# Patient Record
Sex: Male | Born: 1950 | Race: White | Hispanic: No | Marital: Single | Smoking: Never smoker
Health system: Southern US, Community
[De-identification: ages and names within clinical notes are randomized; demographics above are authoritative.]

## PROBLEM LIST (undated history)

## (undated) DIAGNOSIS — D72829 Elevated white blood cell count, unspecified: Secondary | ICD-10-CM

## (undated) DIAGNOSIS — Z87820 Personal history of traumatic brain injury: Secondary | ICD-10-CM

## (undated) DIAGNOSIS — R1310 Dysphagia, unspecified: Secondary | ICD-10-CM

## (undated) DIAGNOSIS — S069X9A Unspecified intracranial injury with loss of consciousness of unspecified duration, initial encounter: Secondary | ICD-10-CM

## (undated) DIAGNOSIS — I10 Essential (primary) hypertension: Secondary | ICD-10-CM

## (undated) DIAGNOSIS — I483 Typical atrial flutter: Secondary | ICD-10-CM

## (undated) DIAGNOSIS — I509 Heart failure, unspecified: Secondary | ICD-10-CM

## (undated) DIAGNOSIS — G8194 Hemiplegia, unspecified affecting left nondominant side: Secondary | ICD-10-CM

## (undated) DIAGNOSIS — G8190 Hemiplegia, unspecified affecting unspecified side: Secondary | ICD-10-CM

## (undated) DIAGNOSIS — T68XXXA Hypothermia, initial encounter: Secondary | ICD-10-CM

## (undated) DIAGNOSIS — Z9189 Other specified personal risk factors, not elsewhere classified: Secondary | ICD-10-CM

## (undated) DIAGNOSIS — E785 Hyperlipidemia, unspecified: Secondary | ICD-10-CM

## (undated) DIAGNOSIS — G40909 Epilepsy, unspecified, not intractable, without status epilepticus: Secondary | ICD-10-CM

## (undated) DIAGNOSIS — Z66 Do not resuscitate: Secondary | ICD-10-CM

## (undated) DIAGNOSIS — K5909 Other constipation: Secondary | ICD-10-CM

## (undated) DIAGNOSIS — R1084 Generalized abdominal pain: Secondary | ICD-10-CM

## (undated) DIAGNOSIS — R197 Diarrhea, unspecified: Secondary | ICD-10-CM

## (undated) DIAGNOSIS — S069XAA Unspecified intracranial injury with loss of consciousness status unknown, initial encounter: Secondary | ICD-10-CM

## (undated) DIAGNOSIS — I959 Hypotension, unspecified: Secondary | ICD-10-CM

## (undated) DIAGNOSIS — I639 Cerebral infarction, unspecified: Secondary | ICD-10-CM

## (undated) DIAGNOSIS — I2510 Atherosclerotic heart disease of native coronary artery without angina pectoris: Secondary | ICD-10-CM

## (undated) DIAGNOSIS — G40219 Localization-related (focal) (partial) symptomatic epilepsy and epileptic syndromes with complex partial seizures, intractable, without status epilepticus: Secondary | ICD-10-CM

## (undated) DIAGNOSIS — A419 Sepsis, unspecified organism: Secondary | ICD-10-CM

## (undated) DIAGNOSIS — R4182 Altered mental status, unspecified: Secondary | ICD-10-CM

## (undated) DIAGNOSIS — G40119 Localization-related (focal) (partial) symptomatic epilepsy and epileptic syndromes with simple partial seizures, intractable, without status epilepticus: Secondary | ICD-10-CM

## (undated) HISTORY — PX: CORONARY ANGIOPLASTY WITH STENT PLACEMENT: SHX49

## (undated) HISTORY — PX: HERNIA REPAIR: SHX51

## (undated) HISTORY — DX: Generalized abdominal pain: R10.84

## (undated) HISTORY — DX: Other constipation: K59.09

## (undated) HISTORY — DX: Epilepsy, unspecified, not intractable, without status epilepticus: G40.909

## (undated) HISTORY — PX: CRANIOTOMY: SHX93

## (undated) HISTORY — DX: Hemiplegia, unspecified affecting unspecified side: G81.90

## (undated) HISTORY — DX: Essential (primary) hypertension: I10

## (undated) HISTORY — DX: Altered mental status, unspecified: R41.82

## (undated) HISTORY — DX: Diarrhea, unspecified: R19.7

## (undated) HISTORY — DX: Localization-related (focal) (partial) symptomatic epilepsy and epileptic syndromes with complex partial seizures, intractable, without status epilepticus: G40.219

## (undated) HISTORY — DX: Personal history of traumatic brain injury: Z87.820

## (undated) HISTORY — DX: Hemiplegia, unspecified affecting left nondominant side: G81.94

## (undated) HISTORY — DX: Hypothermia, initial encounter: T68.XXXA

## (undated) HISTORY — PX: BRAIN SURGERY: SHX531

## (undated) HISTORY — DX: Other specified personal risk factors, not elsewhere classified: Z91.89

## (undated) HISTORY — PX: CHOLECYSTECTOMY: SHX55

## (undated) HISTORY — DX: Hypotension, unspecified: I95.9

## (undated) HISTORY — DX: Hyperlipidemia, unspecified: E78.5

## (undated) HISTORY — DX: Typical atrial flutter: I48.3

## (undated) HISTORY — DX: Do not resuscitate: Z66

## (undated) HISTORY — DX: Sepsis, unspecified organism: A41.9

## (undated) HISTORY — DX: Elevated white blood cell count, unspecified: D72.829

## (undated) HISTORY — DX: Localization-related (focal) (partial) symptomatic epilepsy and epileptic syndromes with simple partial seizures, intractable, without status epilepticus: G40.119

## (undated) HISTORY — PX: CARDIAC SURGERY: SHX584

---

## 2004-08-19 ENCOUNTER — Ambulatory Visit: Payer: Self-pay | Admitting: Cardiology

## 2004-08-19 ENCOUNTER — Inpatient Hospital Stay (HOSPITAL_COMMUNITY): Admission: AD | Admit: 2004-08-19 | Discharge: 2004-08-23 | Payer: Self-pay | Admitting: Cardiology

## 2011-12-13 DIAGNOSIS — G40219 Localization-related (focal) (partial) symptomatic epilepsy and epileptic syndromes with complex partial seizures, intractable, without status epilepticus: Secondary | ICD-10-CM

## 2011-12-13 DIAGNOSIS — G8190 Hemiplegia, unspecified affecting unspecified side: Secondary | ICD-10-CM

## 2011-12-13 HISTORY — DX: Hemiplegia, unspecified affecting unspecified side: G81.90

## 2011-12-13 HISTORY — DX: Localization-related (focal) (partial) symptomatic epilepsy and epileptic syndromes with complex partial seizures, intractable, without status epilepticus: G40.219

## 2015-06-30 DIAGNOSIS — E785 Hyperlipidemia, unspecified: Secondary | ICD-10-CM

## 2015-06-30 HISTORY — DX: Hyperlipidemia, unspecified: E78.5

## 2016-03-22 DIAGNOSIS — G40309 Generalized idiopathic epilepsy and epileptic syndromes, not intractable, without status epilepticus: Secondary | ICD-10-CM

## 2016-03-22 DIAGNOSIS — G9341 Metabolic encephalopathy: Secondary | ICD-10-CM | POA: Diagnosis not present

## 2016-03-22 DIAGNOSIS — I63319 Cerebral infarction due to thrombosis of unspecified middle cerebral artery: Secondary | ICD-10-CM

## 2016-03-22 DIAGNOSIS — N289 Disorder of kidney and ureter, unspecified: Secondary | ICD-10-CM | POA: Diagnosis not present

## 2016-03-22 DIAGNOSIS — R197 Diarrhea, unspecified: Secondary | ICD-10-CM

## 2016-03-22 DIAGNOSIS — M4854XA Collapsed vertebra, not elsewhere classified, thoracic region, initial encounter for fracture: Secondary | ICD-10-CM

## 2016-03-23 DIAGNOSIS — G40309 Generalized idiopathic epilepsy and epileptic syndromes, not intractable, without status epilepticus: Secondary | ICD-10-CM

## 2016-03-23 DIAGNOSIS — I69319 Unspecified symptoms and signs involving cognitive functions following cerebral infarction: Secondary | ICD-10-CM

## 2016-03-23 DIAGNOSIS — N289 Disorder of kidney and ureter, unspecified: Secondary | ICD-10-CM

## 2016-03-23 DIAGNOSIS — R197 Diarrhea, unspecified: Secondary | ICD-10-CM

## 2016-03-23 DIAGNOSIS — M4854XA Collapsed vertebra, not elsewhere classified, thoracic region, initial encounter for fracture: Secondary | ICD-10-CM

## 2016-03-23 DIAGNOSIS — G9341 Metabolic encephalopathy: Secondary | ICD-10-CM

## 2016-03-24 DIAGNOSIS — G9341 Metabolic encephalopathy: Secondary | ICD-10-CM

## 2016-03-24 DIAGNOSIS — N289 Disorder of kidney and ureter, unspecified: Secondary | ICD-10-CM | POA: Diagnosis not present

## 2016-03-24 DIAGNOSIS — G40309 Generalized idiopathic epilepsy and epileptic syndromes, not intractable, without status epilepticus: Secondary | ICD-10-CM

## 2016-03-24 DIAGNOSIS — M4854XA Collapsed vertebra, not elsewhere classified, thoracic region, initial encounter for fracture: Secondary | ICD-10-CM

## 2016-03-24 DIAGNOSIS — I63319 Cerebral infarction due to thrombosis of unspecified middle cerebral artery: Secondary | ICD-10-CM

## 2016-03-24 DIAGNOSIS — R197 Diarrhea, unspecified: Secondary | ICD-10-CM

## 2016-03-29 ENCOUNTER — Encounter (HOSPITAL_COMMUNITY): Payer: Self-pay | Admitting: Emergency Medicine

## 2016-03-29 ENCOUNTER — Inpatient Hospital Stay (HOSPITAL_COMMUNITY)
Admission: EM | Admit: 2016-03-29 | Discharge: 2016-04-03 | DRG: 872 | Disposition: A | Discharge status: Skilled Nursing Facility | Payer: Medicare Other | Attending: Family Medicine | Admitting: Family Medicine

## 2016-03-29 ENCOUNTER — Emergency Department (HOSPITAL_COMMUNITY): Payer: Medicare Other

## 2016-03-29 DIAGNOSIS — I4891 Unspecified atrial fibrillation: Secondary | ICD-10-CM | POA: Diagnosis not present

## 2016-03-29 DIAGNOSIS — Z87820 Personal history of traumatic brain injury: Secondary | ICD-10-CM | POA: Diagnosis present

## 2016-03-29 DIAGNOSIS — Z79899 Other long term (current) drug therapy: Secondary | ICD-10-CM | POA: Diagnosis not present

## 2016-03-29 DIAGNOSIS — K5900 Constipation, unspecified: Secondary | ICD-10-CM | POA: Diagnosis not present

## 2016-03-29 DIAGNOSIS — I2510 Atherosclerotic heart disease of native coronary artery without angina pectoris: Secondary | ICD-10-CM | POA: Diagnosis present

## 2016-03-29 DIAGNOSIS — Z66 Do not resuscitate: Secondary | ICD-10-CM

## 2016-03-29 DIAGNOSIS — Z7902 Long term (current) use of antithrombotics/antiplatelets: Secondary | ICD-10-CM | POA: Diagnosis not present

## 2016-03-29 DIAGNOSIS — E876 Hypokalemia: Secondary | ICD-10-CM | POA: Diagnosis present

## 2016-03-29 DIAGNOSIS — R1310 Dysphagia, unspecified: Secondary | ICD-10-CM

## 2016-03-29 DIAGNOSIS — Z22322 Carrier or suspected carrier of Methicillin resistant Staphylococcus aureus: Secondary | ICD-10-CM | POA: Diagnosis not present

## 2016-03-29 DIAGNOSIS — R404 Transient alteration of awareness: Secondary | ICD-10-CM | POA: Diagnosis not present

## 2016-03-29 DIAGNOSIS — S069X9A Unspecified intracranial injury with loss of consciousness of unspecified duration, initial encounter: Secondary | ICD-10-CM | POA: Diagnosis present

## 2016-03-29 DIAGNOSIS — G40909 Epilepsy, unspecified, not intractable, without status epilepticus: Secondary | ICD-10-CM

## 2016-03-29 DIAGNOSIS — Z886 Allergy status to analgesic agent status: Secondary | ICD-10-CM

## 2016-03-29 DIAGNOSIS — Z8673 Personal history of transient ischemic attack (TIA), and cerebral infarction without residual deficits: Secondary | ICD-10-CM

## 2016-03-29 DIAGNOSIS — R1084 Generalized abdominal pain: Secondary | ICD-10-CM | POA: Diagnosis present

## 2016-03-29 DIAGNOSIS — S069XAA Unspecified intracranial injury with loss of consciousness status unknown, initial encounter: Secondary | ICD-10-CM | POA: Diagnosis present

## 2016-03-29 DIAGNOSIS — R109 Unspecified abdominal pain: Secondary | ICD-10-CM

## 2016-03-29 DIAGNOSIS — R4182 Altered mental status, unspecified: Secondary | ICD-10-CM | POA: Diagnosis present

## 2016-03-29 DIAGNOSIS — S069X9D Unspecified intracranial injury with loss of consciousness of unspecified duration, subsequent encounter: Secondary | ICD-10-CM

## 2016-03-29 DIAGNOSIS — I959 Hypotension, unspecified: Secondary | ICD-10-CM

## 2016-03-29 DIAGNOSIS — Z888 Allergy status to other drugs, medicaments and biological substances status: Secondary | ICD-10-CM | POA: Diagnosis not present

## 2016-03-29 DIAGNOSIS — R6520 Severe sepsis without septic shock: Secondary | ICD-10-CM | POA: Diagnosis present

## 2016-03-29 DIAGNOSIS — R739 Hyperglycemia, unspecified: Secondary | ICD-10-CM | POA: Diagnosis present

## 2016-03-29 DIAGNOSIS — A419 Sepsis, unspecified organism: Principal | ICD-10-CM | POA: Diagnosis present

## 2016-03-29 DIAGNOSIS — R680 Hypothermia, not associated with low environmental temperature: Secondary | ICD-10-CM | POA: Diagnosis present

## 2016-03-29 DIAGNOSIS — Z883 Allergy status to other anti-infective agents status: Secondary | ICD-10-CM | POA: Diagnosis not present

## 2016-03-29 DIAGNOSIS — K5909 Other constipation: Secondary | ICD-10-CM

## 2016-03-29 DIAGNOSIS — G8194 Hemiplegia, unspecified affecting left nondominant side: Secondary | ICD-10-CM

## 2016-03-29 DIAGNOSIS — K219 Gastro-esophageal reflux disease without esophagitis: Secondary | ICD-10-CM | POA: Diagnosis present

## 2016-03-29 DIAGNOSIS — Z9189 Other specified personal risk factors, not elsewhere classified: Secondary | ICD-10-CM

## 2016-03-29 DIAGNOSIS — I509 Heart failure, unspecified: Secondary | ICD-10-CM

## 2016-03-29 DIAGNOSIS — I10 Essential (primary) hypertension: Secondary | ICD-10-CM

## 2016-03-29 DIAGNOSIS — T68XXXA Hypothermia, initial encounter: Secondary | ICD-10-CM

## 2016-03-29 DIAGNOSIS — R197 Diarrhea, unspecified: Secondary | ICD-10-CM | POA: Diagnosis present

## 2016-03-29 DIAGNOSIS — I110 Hypertensive heart disease with heart failure: Secondary | ICD-10-CM | POA: Diagnosis present

## 2016-03-29 DIAGNOSIS — Z881 Allergy status to other antibiotic agents status: Secondary | ICD-10-CM

## 2016-03-29 DIAGNOSIS — D72829 Elevated white blood cell count, unspecified: Secondary | ICD-10-CM | POA: Diagnosis present

## 2016-03-29 DIAGNOSIS — I4892 Unspecified atrial flutter: Secondary | ICD-10-CM | POA: Diagnosis present

## 2016-03-29 DIAGNOSIS — E872 Acidosis: Secondary | ICD-10-CM | POA: Diagnosis present

## 2016-03-29 DIAGNOSIS — I25119 Atherosclerotic heart disease of native coronary artery with unspecified angina pectoris: Secondary | ICD-10-CM

## 2016-03-29 HISTORY — DX: Altered mental status, unspecified: R41.82

## 2016-03-29 HISTORY — DX: Cerebral infarction, unspecified: I63.9

## 2016-03-29 HISTORY — DX: Unspecified intracranial injury with loss of consciousness of unspecified duration, initial encounter: S06.9X9A

## 2016-03-29 HISTORY — DX: Do not resuscitate: Z66

## 2016-03-29 HISTORY — DX: Dysphagia, unspecified: R13.10

## 2016-03-29 HISTORY — DX: Hypotension, unspecified: I95.9

## 2016-03-29 HISTORY — DX: Heart failure, unspecified: I50.9

## 2016-03-29 HISTORY — DX: Unspecified intracranial injury with loss of consciousness status unknown, initial encounter: S06.9XAA

## 2016-03-29 HISTORY — DX: Other specified personal risk factors, not elsewhere classified: Z91.89

## 2016-03-29 HISTORY — DX: Generalized abdominal pain: R10.84

## 2016-03-29 HISTORY — DX: Other constipation: K59.09

## 2016-03-29 HISTORY — DX: Atherosclerotic heart disease of native coronary artery without angina pectoris: I25.10

## 2016-03-29 HISTORY — DX: Epilepsy, unspecified, not intractable, without status epilepticus: G40.909

## 2016-03-29 HISTORY — DX: Hypothermia, initial encounter: T68.XXXA

## 2016-03-29 HISTORY — DX: Essential (primary) hypertension: I10

## 2016-03-29 HISTORY — DX: Hemiplegia, unspecified affecting left nondominant side: G81.94

## 2016-03-29 HISTORY — DX: Elevated white blood cell count, unspecified: D72.829

## 2016-03-29 LAB — CBC WITH DIFFERENTIAL/PLATELET
BASOS PCT: 0 %
Basophils Absolute: 0 10*3/uL (ref 0.0–0.1)
Eosinophils Absolute: 0.1 10*3/uL (ref 0.0–0.7)
Eosinophils Relative: 1 %
HEMATOCRIT: 41.8 % (ref 39.0–52.0)
Hemoglobin: 13.4 g/dL (ref 13.0–17.0)
Lymphocytes Relative: 15 %
Lymphs Abs: 2.3 10*3/uL (ref 0.7–4.0)
MCH: 30.1 pg (ref 26.0–34.0)
MCHC: 32.1 g/dL (ref 30.0–36.0)
MCV: 93.9 fL (ref 78.0–100.0)
MONO ABS: 0.7 10*3/uL (ref 0.1–1.0)
MONOS PCT: 5 %
NEUTROS ABS: 11.7 10*3/uL — AB (ref 1.7–7.7)
Neutrophils Relative %: 79 %
Platelets: 287 10*3/uL (ref 150–400)
RBC: 4.45 MIL/uL (ref 4.22–5.81)
RDW: 13.6 % (ref 11.5–15.5)
WBC: 14.9 10*3/uL — ABNORMAL HIGH (ref 4.0–10.5)

## 2016-03-29 LAB — URINE MICROSCOPIC-ADD ON: RBC / HPF: NONE SEEN RBC/hpf (ref 0–5)

## 2016-03-29 LAB — URINALYSIS, ROUTINE W REFLEX MICROSCOPIC
GLUCOSE, UA: NEGATIVE mg/dL
HGB URINE DIPSTICK: NEGATIVE
KETONES UR: 15 mg/dL — AB
NITRITE: NEGATIVE
PH: 5 (ref 5.0–8.0)
Protein, ur: NEGATIVE mg/dL
Specific Gravity, Urine: 1.025 (ref 1.005–1.030)

## 2016-03-29 LAB — COMPREHENSIVE METABOLIC PANEL
ALT: 25 U/L (ref 17–63)
AST: 31 U/L (ref 15–41)
Albumin: 3.6 g/dL (ref 3.5–5.0)
Alkaline Phosphatase: 116 U/L (ref 38–126)
Anion gap: 9 (ref 5–15)
BILIRUBIN TOTAL: 0.5 mg/dL (ref 0.3–1.2)
BUN: 12 mg/dL (ref 6–20)
CHLORIDE: 104 mmol/L (ref 101–111)
CO2: 26 mmol/L (ref 22–32)
CREATININE: 1.32 mg/dL — AB (ref 0.61–1.24)
Calcium: 10.2 mg/dL (ref 8.9–10.3)
GFR calc Af Amer: >60 mL/min (ref >60)
GFR, EST NON AFRICAN AMERICAN: 56 mL/min — AB (ref >60)
Glucose, Bld: 152 mg/dL — ABNORMAL HIGH (ref 65–99)
Potassium: 4.4 mmol/L (ref 3.5–5.1)
Sodium: 139 mmol/L (ref 135–145)
TOTAL PROTEIN: 8.2 g/dL — AB (ref 6.5–8.1)

## 2016-03-29 LAB — I-STAT TROPONIN, ED: TROPONIN I, POC: 0 ng/mL (ref 0.00–0.08)

## 2016-03-29 LAB — I-STAT CG4 LACTIC ACID, ED: LACTIC ACID, VENOUS: 3.67 mmol/L — AB (ref 0.5–2.0)

## 2016-03-29 LAB — MRSA PCR SCREENING: MRSA by PCR: POSITIVE — AB

## 2016-03-29 MED ORDER — MONTELUKAST SODIUM 10 MG PO TABS
10.0000 mg | ORAL_TABLET | Freq: Every day | ORAL | Status: DC
  Administered 2016-03-29 – 2016-04-02 (×5): 10 mg via ORAL
  Filled 2016-03-29 (×5): qty 1

## 2016-03-29 MED ORDER — SODIUM CHLORIDE 0.9 % IV BOLUS (SEPSIS)
1000.0000 mL | Freq: Once | INTRAVENOUS | Status: AC
  Administered 2016-03-29: 1000 mL via INTRAVENOUS

## 2016-03-29 MED ORDER — ENOXAPARIN SODIUM 40 MG/0.4ML ~~LOC~~ SOLN
40.0000 mg | SUBCUTANEOUS | Status: DC
  Administered 2016-03-29 – 2016-03-31 (×3): 40 mg via SUBCUTANEOUS
  Filled 2016-03-29 (×3): qty 0.4

## 2016-03-29 MED ORDER — CLOBAZAM 10 MG PO TABS
40.0000 mg | ORAL_TABLET | Freq: Every evening | ORAL | Status: DC
  Administered 2016-03-29 – 2016-04-02 (×5): 40 mg via ORAL
  Filled 2016-03-29 (×5): qty 4

## 2016-03-29 MED ORDER — RISAQUAD PO CAPS
1.0000 | ORAL_CAPSULE | Freq: Every day | ORAL | Status: DC
  Administered 2016-03-30 – 2016-04-03 (×5): 1 via ORAL
  Filled 2016-03-29 (×6): qty 1

## 2016-03-29 MED ORDER — ONDANSETRON HCL 4 MG/2ML IJ SOLN
4.0000 mg | Freq: Four times a day (QID) | INTRAMUSCULAR | Status: DC | PRN
  Filled 2016-03-29 (×2): qty 2

## 2016-03-29 MED ORDER — CLOPIDOGREL BISULFATE 75 MG PO TABS
75.0000 mg | ORAL_TABLET | Freq: Every day | ORAL | Status: DC
  Administered 2016-03-30 – 2016-04-03 (×5): 75 mg via ORAL
  Filled 2016-03-29 (×5): qty 1

## 2016-03-29 MED ORDER — SODIUM CHLORIDE 0.9 % IV SOLN
2000.0000 mg | Freq: Once | INTRAVENOUS | Status: AC
  Administered 2016-03-29: 2000 mg via INTRAVENOUS
  Filled 2016-03-29: qty 2000

## 2016-03-29 MED ORDER — BETHANECHOL CHLORIDE 25 MG PO TABS
25.0000 mg | ORAL_TABLET | Freq: Three times a day (TID) | ORAL | Status: DC
  Administered 2016-03-29 – 2016-04-03 (×14): 25 mg via ORAL
  Filled 2016-03-29: qty 3
  Filled 2016-03-29 (×4): qty 1
  Filled 2016-03-29: qty 3
  Filled 2016-03-29: qty 1
  Filled 2016-03-29: qty 3
  Filled 2016-03-29 (×2): qty 1
  Filled 2016-03-29: qty 3
  Filled 2016-03-29: qty 1
  Filled 2016-03-29: qty 3
  Filled 2016-03-29 (×4): qty 1

## 2016-03-29 MED ORDER — METOPROLOL TARTRATE 25 MG PO TABS
25.0000 mg | ORAL_TABLET | Freq: Three times a day (TID) | ORAL | Status: DC
  Administered 2016-03-29 – 2016-04-03 (×14): 25 mg via ORAL
  Filled 2016-03-29 (×14): qty 1

## 2016-03-29 MED ORDER — LAMOTRIGINE 100 MG PO TABS
400.0000 mg | ORAL_TABLET | Freq: Two times a day (BID) | ORAL | Status: DC
  Administered 2016-03-29 – 2016-04-03 (×10): 400 mg via ORAL
  Filled 2016-03-29 (×6): qty 4
  Filled 2016-03-29: qty 2
  Filled 2016-03-29 (×3): qty 4
  Filled 2016-03-29: qty 2
  Filled 2016-03-29 (×2): qty 4

## 2016-03-29 MED ORDER — PIPERACILLIN-TAZOBACTAM 3.375 G IVPB
3.3750 g | Freq: Three times a day (TID) | INTRAVENOUS | Status: DC
  Administered 2016-03-29 – 2016-04-03 (×14): 3.375 g via INTRAVENOUS
  Filled 2016-03-29 (×16): qty 50

## 2016-03-29 MED ORDER — IOPAMIDOL (ISOVUE-300) INJECTION 61%
INTRAVENOUS | Status: AC
  Administered 2016-03-30: 100 mL
  Filled 2016-03-29: qty 100

## 2016-03-29 MED ORDER — VANCOMYCIN HCL IN DEXTROSE 1-5 GM/200ML-% IV SOLN: 1000.0000 mg | Freq: Once | INTRAVENOUS | Status: DC

## 2016-03-29 MED ORDER — IPRATROPIUM-ALBUTEROL 0.5-2.5 (3) MG/3ML IN SOLN
3.0000 mL | Freq: Four times a day (QID) | RESPIRATORY_TRACT | Status: DC
  Administered 2016-03-29 – 2016-03-31 (×5): 3 mL via RESPIRATORY_TRACT
  Filled 2016-03-29 (×5): qty 3

## 2016-03-29 MED ORDER — ATORVASTATIN CALCIUM 20 MG PO TABS
20.0000 mg | ORAL_TABLET | Freq: Every day | ORAL | Status: DC
  Administered 2016-03-30 – 2016-04-03 (×5): 20 mg via ORAL
  Filled 2016-03-29 (×5): qty 1

## 2016-03-29 MED ORDER — VANCOMYCIN HCL IN DEXTROSE 1-5 GM/200ML-% IV SOLN
1000.0000 mg | Freq: Two times a day (BID) | INTRAVENOUS | Status: DC
  Administered 2016-03-30 – 2016-03-31 (×4): 1000 mg via INTRAVENOUS
  Filled 2016-03-29 (×5): qty 200

## 2016-03-29 MED ORDER — DIAZEPAM 5 MG/ML IJ SOLN
5.0000 mg | INTRAMUSCULAR | Status: DC | PRN
  Administered 2016-03-29: 5 mg via INTRAVENOUS
  Filled 2016-03-29: qty 2

## 2016-03-29 MED ORDER — ONDANSETRON HCL 4 MG PO TABS: 4.0000 mg | ORAL_TABLET | Freq: Four times a day (QID) | ORAL | Status: DC | PRN

## 2016-03-29 MED ORDER — SODIUM FLUORIDE 1.1 % DT CREA: 1.0000 "application " | TOPICAL_CREAM | Freq: Two times a day (BID) | DENTAL | Status: DC

## 2016-03-29 MED ORDER — TAMSULOSIN HCL 0.4 MG PO CAPS
0.4000 mg | ORAL_CAPSULE | Freq: Every day | ORAL | Status: DC
  Administered 2016-03-29 – 2016-04-02 (×5): 0.4 mg via ORAL
  Filled 2016-03-29 (×5): qty 1

## 2016-03-29 MED ORDER — LEVETIRACETAM 500 MG PO TABS
1000.0000 mg | ORAL_TABLET | Freq: Every evening | ORAL | Status: DC
  Administered 2016-03-29 – 2016-04-02 (×5): 1000 mg via ORAL
  Filled 2016-03-29 (×5): qty 2

## 2016-03-29 MED ORDER — CETYLPYRIDINIUM CHLORIDE 0.05 % MT LIQD
7.0000 mL | Freq: Two times a day (BID) | OROMUCOSAL | Status: DC
  Administered 2016-03-29 – 2016-04-03 (×9): 7 mL via OROMUCOSAL

## 2016-03-29 MED ORDER — FINASTERIDE 5 MG PO TABS
5.0000 mg | ORAL_TABLET | Freq: Every day | ORAL | Status: DC
  Administered 2016-03-30 – 2016-04-03 (×5): 5 mg via ORAL
  Filled 2016-03-29 (×5): qty 1

## 2016-03-29 MED ORDER — LEVETIRACETAM 500 MG PO TABS
1250.0000 mg | ORAL_TABLET | Freq: Every morning | ORAL | Status: DC
  Administered 2016-03-30 – 2016-04-03 (×5): 1250 mg via ORAL
  Filled 2016-03-29 (×2): qty 2
  Filled 2016-03-29 (×2): qty 1
  Filled 2016-03-29: qty 2

## 2016-03-29 MED ORDER — SENNOSIDES-DOCUSATE SODIUM 8.6-50 MG PO TABS: 1.0000 | ORAL_TABLET | Freq: Every evening | ORAL | Status: DC | PRN

## 2016-03-29 MED ORDER — SODIUM CHLORIDE 0.9 % IV SOLN
INTRAVENOUS | Status: DC
  Administered 2016-03-29 – 2016-04-02 (×3): via INTRAVENOUS

## 2016-03-29 MED ORDER — DIAZEPAM 5 MG/ML IJ SOLN
5.0000 mg | Freq: Once | INTRAMUSCULAR | Status: AC
  Administered 2016-03-29: 5 mg via INTRAVENOUS
  Filled 2016-03-29: qty 2

## 2016-03-29 MED ORDER — DIAZEPAM 5 MG/ML IJ SOLN: 10.0000 mg | INTRAMUSCULAR | Status: DC | PRN

## 2016-03-29 MED ORDER — METRONIDAZOLE IN NACL 5-0.79 MG/ML-% IV SOLN
500.0000 mg | Freq: Three times a day (TID) | INTRAVENOUS | Status: DC
  Administered 2016-03-29 – 2016-03-30 (×3): 500 mg via INTRAVENOUS
  Filled 2016-03-29 (×4): qty 100

## 2016-03-29 MED ORDER — DIGOXIN 125 MCG PO TABS
0.2500 mg | ORAL_TABLET | Freq: Every day | ORAL | Status: DC
  Administered 2016-03-29 – 2016-04-03 (×6): 0.25 mg via ORAL
  Filled 2016-03-29 (×3): qty 2
  Filled 2016-03-29 (×3): qty 1

## 2016-03-29 MED ORDER — PIPERACILLIN-TAZOBACTAM 3.375 G IVPB 30 MIN
3.3750 g | Freq: Once | INTRAVENOUS | Status: AC
  Administered 2016-03-29: 3.375 g via INTRAVENOUS
  Filled 2016-03-29: qty 50

## 2016-03-29 MED ORDER — DIGOXIN 250 MCG PO TABS: 0.2500 mg | ORAL_TABLET | Freq: Every day | ORAL | Status: DC

## 2016-03-29 MED ORDER — CYCLOBENZAPRINE HCL 5 MG PO TABS: 5.0000 mg | ORAL_TABLET | Freq: Two times a day (BID) | ORAL | Status: DC | PRN

## 2016-03-29 NOTE — ED Provider Notes (Signed)
Critical care has seen the patient and had discussion with family and made him DO NOT RESUSCITATE and has requested hospitalist to admit the patient. Case discussed with Dr. Laural BenesJohnson of [facility] hospitalists who agrees to admit the patient.  Dione Boozeavid Avagail Whittlesey, MD 03/29/16 [phone]

## 2016-03-29 NOTE — ED Notes (Signed)
MD at bedside. 

## 2016-03-29 NOTE — Progress Notes (Signed)
Pt had a seizure, leaned to the right side, some facial twitching present, reported by sister to be around 15 seconds.

## 2016-03-29 NOTE — ED Notes (Signed)
Contacted [facility] to page Code Sepsis @ [phone]

## 2016-03-29 NOTE — ED Notes (Signed)
Pt arrives from [facility].  EMS reports pt was at baseline per staff at noon, reports pt began drooling, became altered, diaphoretic.  EMS reports pt hypotensive, reports giving 50-13700mL NS via IO in L shoulder.  AO x 1. Dr. Donnald GarrePfeiffer at bedside

## 2016-03-29 NOTE — ED Notes (Signed)
Dr. Donnald GarrePfeiffer states verbal order to start IV in foot if needed.

## 2016-03-29 NOTE — Consult Note (Signed)
Name: Austin Hartman MRN: [id number] DOB: April 15, 1951    ADMISSION DATE:  03/29/2016 CONSULTATION DATE:  6/8  REFERRING MD :  EDP   CHIEF COMPLAINT:  SIRS   BRIEF PATIENT DESCRIPTION: 65yo male SNF resident with hx stroke, TBI, CHF, CAD, recent admission ([place]) for sepsis of unclear source, d/c on abx.  Has had ongoing diarrhea but otherwise improving.  Was having lunch with family when he became pale, diaphoretic and reportedly hypotensive per EMS.  Labs in ER revealed leukocytosis.  PCCM consulted for possible ICU admission.   SIGNIFICANT EVENTS    STUDIES:     HISTORY OF PRESENT ILLNESS: 65yo male SNF resident with hx stroke, TBI, CHF, CAD, recent admission () for sepsis of unclear source, d/c on abx.  Has had ongoing diarrhea but otherwise improving.  Was having lunch with family when he became pale, diaphoretic and reportedly hypotensive per EMS.  Labs in ER revealed leukocytosis.  PCCM consulted for possible ICU admission.   PAST MEDICAL HISTORY :   has a past medical history of Coronary artery disease; Stroke (HCC); Hypertension; CHF (congestive heart failure) (HCC); TBI (traumatic brain injury) (HCC); and Dysphagia.  has past surgical history that includes Brain surgery and Cardiac surgery. Prior to Admission medications   Medication Sig Start Date End Date Taking? Authorizing Provider  atorvastatin (LIPITOR) 20 MG tablet Take 20 mg by mouth daily.   Yes Historical Provider, MD  bethanechol (URECHOLINE) 25 MG tablet Take 25 mg by mouth 3 (three) times daily.   Yes Historical Provider, MD  cloBAZam (ONFI) 20 MG tablet Take 40 mg by mouth every evening.   Yes Historical Provider, MD  clopidogrel (PLAVIX) 75 MG tablet Take 75 mg by mouth daily.   Yes Historical Provider, MD  Cranberry 450 MG CAPS Take 450 mg by mouth daily.   Yes Historical Provider, MD  cyclobenzaprine (FLEXERIL) 5 MG tablet Take 5 mg by mouth every 12 (twelve) hours as needed for muscle spasms.   Yes  Historical Provider, MD  digoxin (LANOXIN) 0.25 MG tablet Take 0.25 mg by mouth daily.   Yes Historical Provider, MD  diphenhydrAMINE (BENADRYL) 25 MG tablet Take 25 mg by mouth every 6 (six) hours as needed for allergies.   Yes Historical Provider, MD  finasteride (PROSCAR) 5 MG tablet Take 5 mg by mouth daily.   Yes Historical Provider, MD  FLORA-Q [facility](FLORA-Q) CAPS capsule Take 1 capsule by mouth daily.   Yes Historical Provider, MD  furosemide (LASIX) 20 MG tablet Take 20 mg by mouth.   Yes Historical Provider, MD  ipratropium-albuterol (DUONEB) 0.5-2.5 (3) MG/3ML SOLN Take 3 mLs by nebulization 4 (four) times daily.   Yes Historical Provider, MD  lactulose, encephalopathy, (CHRONULAC) 10 GM/15ML SOLN Take 20 g by mouth 3 (three) times daily.   Yes Historical Provider, MD  lamoTRIgine (LAMICTAL) 200 MG tablet Take 400 mg by mouth 2 (two) times daily.   Yes Historical Provider, MD  levETIRAcetam (KEPPRA) 1000 MG tablet Take 1,000 mg by mouth every evening.   Yes Historical Provider, MD  levETIRAcetam (KEPPRA) 500 MG tablet Take 1,250 mg by mouth daily.   Yes Historical Provider, MD  linaclotide (LINZESS) 145 MCG CAPS capsule Take 145 mcg by mouth daily before breakfast.   Yes Historical Provider, MD  loratadine (CLARITIN) 10 MG tablet Take 10 mg by mouth daily as needed for allergies.   Yes Historical Provider, MD  metoprolol tartrate (LOPRESSOR) 25 MG tablet Take 25 mg by mouth 3 (  three) times daily.   Yes Historical Provider, MD  montelukast (SINGULAIR) 10 MG tablet Take 10 mg by mouth at bedtime.   Yes Historical Provider, MD  Multiple Vitamins-Minerals (MULTIVITAMIN PO) Take 1 tablet by mouth daily.   Yes Historical Provider, MD  oxyCODONE (OXY IR/ROXICODONE) 5 MG immediate release tablet Take 5 mg by mouth every 4 (four) hours as needed for severe pain.   Yes Historical Provider, MD  polyethylene glycol (MIRALAX / GLYCOLAX) packet Take 17 g by mouth every other day.   Yes Historical Provider,  MD  promethazine (PHENERGAN) 25 MG tablet Take 25 mg by mouth every 6 (six) hours as needed for nausea or vomiting.   Yes Historical Provider, MD  sennosides-docusate sodium (SENOKOT-S) 8.6-50 MG tablet Take 2 tablets by mouth daily.   Yes Historical Provider, MD  sodium fluoride (PREVIDENT 5000 PLUS) 1.1 % CREA dental cream Place 1 application onto teeth 2 (two) times daily.   Yes Historical Provider, MD  tamsulosin (FLOMAX) 0.4 MG CAPS capsule Take 0.4 mg by mouth daily after supper.   Yes Historical Provider, MD   Allergies  Allergen Reactions  . Acetaminophen   . Azithromycin   . Biaxin [Clarithromycin]   . Diltiazem   . Verapamil     FAMILY HISTORY:  family history is not on file. SOCIAL HISTORY:  reports that he does not drink alcohol.  REVIEW OF SYSTEMS:   Unable, pt confused (baseline).  HPI obtained from records and ER staff.    SUBJECTIVE:   VITAL SIGNS: Temp:  [95.4 F (35.2 C)] 95.4 F (35.2 C) (06/08 1428) Pulse Rate:  [54-101] 101 (06/08 1630) Resp:  [20-27] 21 (06/08 1630) BP: (130)/(115) 130/115 mmHg (06/08 1530) SpO2:  [88 %-100 %] 100 % (06/08 1630) Weight:  [117.935 kg (260 lb)-127.007 kg (280 lb)] 127.007 kg (280 lb) (06/08 1434)  PHYSICAL EXAMINATION: General:  Chronically ill appearing male, NAD  Neuro:  Moaning, confused, TBI requiring SNF at baseline HEENT:  Mm dry, no JVD, on NRB (can wean off)  Cardiovascular:  s1s2 rrr Lungs:  resps even, non labored, few scattered rhonchi  Abdomen:  Round, soft, non ender  Musculoskeletal:  Pale, cool, dry, no sig edema   Recent Labs Lab 03/29/16 1415  NA 139  K 4.4  CL 104  CO2 26  BUN 12  CREATININE 1.32*  GLUCOSE 152*    Recent Labs Lab 03/29/16 1415  HGB 13.4  HCT 41.8  WBC 14.9*  PLT 287   Dg Chest Port 1 View  03/29/2016  CLINICAL DATA:  Sepsis hypotension EXAM: PORTABLE CHEST 1 VIEW COMPARISON:  None FINDINGS: Severe cardiac silhouette enlargement with azygos vein distension and mild  pulmonary vascular congestion. No consolidation or edema appreciated. IMPRESSION: Cardiac enlargement with vascular congestion but no evidence of edema or consolidation. Electronically Signed   By: Esperanza Heir M.D.   On: 03/29/2016 15:25    ASSESSMENT / PLAN:  SIRS, possible sepsis - suspect GI source, ?CDiff on recent abx now with diarrhea.  NOT hypotensive at this time.  Hemodynamically stable.  AMS -- likely r/t above but ??seizure.  AMS was sudden per family. Hx stroke, TBI.   PLAN -  Check lactate, pct, CDiff  Gentle volume  F/u chem  Empiric abx -- would add CDiff treatment until proven otherwise  Urine, blood, stool cultures  R/o seizure - consider neuro involvement with previous hx stroke, TBI Ok for SDU admit per [facility]  Discussed at length with family  at bedside.  Pt has been declining overall and quality of life is poor.  They want to continue full medical care but would not want "heroic measures".  Pressors ok if needed (not needed now) but otherwise DNR.   PCCM signing off please call back if needed    Dirk Dress, NP 03/29/2016  4:37 PM Pager: [phone] or [phone]   STAFF NOTE: I, Rory Percy, MD FACP have personally reviewed patient's available data, including medical history, events of note, physical examination and test results as part of my evaluation. I have discussed with resident/NP and other care providers such as pharmacist, RN and RRT. In addition, I personally evaluated patient and elicited key findings of:  Awake, mild distress, cta, mild edema legs chronic, abdo soft distended, likely inaccurate BP cuff readings, does not follow commands, noted temp 95, recent infections at [place] and abx exposure, neg cdiff 1 week ago and recent uti e coli per sister, at this stage itg is not clear to me if this is primary seizure vs sepsis vs cdiff sepsis, await UA, would send BC, give empiric fluids 30 cc/kg, lactic, repeat lactic and bundle, would  treat empiric cdiff oral vanc / flagyl while we wait for cdiff assay, would call neuro consult, eeg, he is 118 sys and map is good with 600 cc fluids, would treat unknown source sepsis abx for now nosocomial, if cdiff pos dc empiric abx as able, code sepsis is reasonable at this stage, I have had extensive discussions with family . We discussed patients current circumstances and organ failures. We also discussed patient's prior wishes under circumstances such as this. Family has decided to NOT perform resuscitation if arrest but to continue current medical support for now. To [facility], will sign off  Mcarthur Rossetti. Tyson Alias, MD, FACP Pgr: [phone] [facility] 03/29/2016 5:33 PM

## 2016-03-29 NOTE — ED Provider Notes (Signed)
CSN: [id number]     Arrival date & time 03/29/16  1327 History   First MD Initiated Contact with Patient 03/29/16 1340     Chief Complaint  Patient presents with  . Hypotension  . Altered Mental Status     (Consider location/radiation/quality/duration/timing/severity/associated sxs/prior Treatment) HPI Patient has significant history of traumatic brain injury and [facility]. He did have sepsis admission approximately one week ago in [facility]. Approximate 3 weeks prior to that he also was admitted for sepsis. His today family members are with him. He had been doing well. They sat down to noon meal and he suddenly became pale and diaphoretic and confused. His blood pressure dropped and he remained confused. Upon medic arrival the patient was cool and diaphoretic. Patient was hypotensive. They established an IO in the left shoulder to initiate fluid resuscitation. Patient had difficult IV access. Patient is complaining of generalized pain. He does not localize pain.  Family added history of severe constipation, patient has reportedly been getting a lot of laxatives. Shortly after arrival to the ER, the patient had copious and continuous liquid stool output. Past Medical History  Diagnosis Date  . Coronary artery disease   . Stroke (HCC)   . Hypertension   . CHF (congestive heart failure) (HCC)   . TBI (traumatic brain injury) (HCC)   . Dysphagia    Past Surgical History  Procedure Laterality Date  . Brain surgery    . Cardiac surgery     No family history on file. Social History  Substance Use Topics  . Smoking status: Unknown If Ever Smoked  . Smokeless tobacco: None  . Alcohol Use: No    Review of Systems  Cannot obtain review of systems due to patient condition.  Allergies  Acetaminophen; Azithromycin; Biaxin; Diltiazem; and Verapamil  Home Medications   Prior to Admission medications   Medication Sig Start Date End Date Taking? Authorizing Provider   atorvastatin (LIPITOR) 20 MG tablet Take 20 mg by mouth daily.   Yes Historical Provider, MD  bethanechol (URECHOLINE) 25 MG tablet Take 25 mg by mouth 3 (three) times daily.   Yes Historical Provider, MD  cloBAZam (ONFI) 20 MG tablet Take 40 mg by mouth every evening.   Yes Historical Provider, MD  clopidogrel (PLAVIX) 75 MG tablet Take 75 mg by mouth daily.   Yes Historical Provider, MD  Cranberry 450 MG CAPS Take 450 mg by mouth daily.   Yes Historical Provider, MD  cyclobenzaprine (FLEXERIL) 5 MG tablet Take 5 mg by mouth every 12 (twelve) hours as needed for muscle spasms.   Yes Historical Provider, MD  digoxin (LANOXIN) 0.25 MG tablet Take 0.25 mg by mouth daily.   Yes Historical Provider, MD  diphenhydrAMINE (BENADRYL) 25 MG tablet Take 25 mg by mouth every 6 (six) hours as needed for allergies.   Yes Historical Provider, MD  finasteride (PROSCAR) 5 MG tablet Take 5 mg by mouth daily.   Yes Historical Provider, MD  FLORA-Q [facility](FLORA-Q) CAPS capsule Take 1 capsule by mouth daily.   Yes Historical Provider, MD  furosemide (LASIX) 20 MG tablet Take 20 mg by mouth.   Yes Historical Provider, MD  ipratropium-albuterol (DUONEB) 0.5-2.5 (3) MG/3ML SOLN Take 3 mLs by nebulization 4 (four) times daily.   Yes Historical Provider, MD  lactulose, encephalopathy, (CHRONULAC) 10 GM/15ML SOLN Take 20 g by mouth 3 (three) times daily.   Yes Historical Provider, MD  lamoTRIgine (LAMICTAL) 200 MG tablet Take 400 mg by  mouth 2 (two) times daily.   Yes Historical Provider, MD  levETIRAcetam (KEPPRA) 1000 MG tablet Take 1,000 mg by mouth every evening.   Yes Historical Provider, MD  levETIRAcetam (KEPPRA) 500 MG tablet Take 1,250 mg by mouth daily.   Yes Historical Provider, MD  linaclotide (LINZESS) 145 MCG CAPS capsule Take 145 mcg by mouth daily before breakfast.   Yes Historical Provider, MD  loratadine (CLARITIN) 10 MG tablet Take 10 mg by mouth daily as needed for allergies.   Yes Historical Provider, MD   metoprolol tartrate (LOPRESSOR) 25 MG tablet Take 25 mg by mouth 3 (three) times daily.   Yes Historical Provider, MD  montelukast (SINGULAIR) 10 MG tablet Take 10 mg by mouth at bedtime.   Yes Historical Provider, MD  Multiple Vitamins-Minerals (MULTIVITAMIN PO) Take 1 tablet by mouth daily.   Yes Historical Provider, MD  oxyCODONE (OXY IR/ROXICODONE) 5 MG immediate release tablet Take 5 mg by mouth every 4 (four) hours as needed for severe pain.   Yes Historical Provider, MD  polyethylene glycol (MIRALAX / GLYCOLAX) packet Take 17 g by mouth every other day.   Yes Historical Provider, MD  promethazine (PHENERGAN) 25 MG tablet Take 25 mg by mouth every 6 (six) hours as needed for nausea or vomiting.   Yes Historical Provider, MD  sennosides-docusate sodium (SENOKOT-S) 8.6-50 MG tablet Take 2 tablets by mouth daily.   Yes Historical Provider, MD  sodium fluoride (PREVIDENT 5000 PLUS) 1.1 % CREA dental cream Place 1 application onto teeth 2 (two) times daily.   Yes Historical Provider, MD  tamsulosin (FLOMAX) 0.4 MG CAPS capsule Take 0.4 mg by mouth daily after supper.   Yes Historical Provider, MD   BP 118/76 mmHg  Pulse 102  Temp(Src) 95.4 F (35.2 C) (Rectal)  Resp 19  Ht 6' (1.829 m)  Wt 280 lb (127.007 kg)  BMI 37.97 kg/m2  SpO2 100% Physical Exam  Constitutional:  Patient is pale and diaphoretic. He is ill in appearance. No respiratory distress.  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 All scars are well healed.  Eyes: EOM are normal.  Cardiovascular: Normal rate and regular rhythm.   Distal pulses are thready.  Pulmonary/Chest: Effort normal.  Anterior auscultation adequate air flow bilaterally no gross wheeze or rhonchi  Abdominal: Soft. He exhibits distension. There is tenderness.  Musculoskeletal: He exhibits edema. He exhibits no tenderness.  Neurological: He is alert.  Patient is significant baseline dysfunction  neurologically. 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  Skin: There is pallor.  Diaphoretic    ED Course  Procedures (including critical care time) Angiocath insertion Performed by: Arby Barrette  Consent: Verbal consent obtained. Risks and benefits: risks, benefits and alternatives were discussed Time out: Immediately prior to procedure a "time out" was called to verify the correct patient, procedure, equipment, support staff and site/side marked as required.  Preparation: Patient was prepped and draped in the usual sterile fashion.  Vein Location: left AC  Ultrasound Guided  Gauge: 20  Blood return slow but flushed without resistance and not showing signs of infiltration with 3 flushes. Patient tolerance: Patient tolerated the procedure well with no immediate complications.  Angiocath insertion Performed by: Arby Barrette  Consent: Verbal consent obtained. Risks and benefits: risks, benefits and alternatives were discussed Time out: Immediately prior to procedure a "time out" was called to verify the correct patient, procedure, equipment, support staff and site/side marked as required.  Preparation: Patient was prepped and draped in the usual sterile fashion.  Vein Location:right AC  Ultrasound Guided  Gauge: 20  Normal blood return and flush without difficulty Patient tolerance: Patient tolerated the procedure well with no immediate complications.  CRITICAL CARE Performed by: Arby Barrette   Total critical care time: 45 minutes  Critical care time was exclusive of separately billable procedures and treating other patients.  Critical care was necessary to treat or prevent imminent or life-threatening deterioration.  Critical care was time spent personally by me on the following activities: development of treatment plan with patient and/or surrogate as well as nursing, discussions with consultants, evaluation of patient's response to treatment, examination of  patient, obtaining history from patient or surrogate, ordering and performing treatments and interventions, ordering and review of laboratory studies, ordering and review of radiographic studies, pulse oximetry and re-evaluation of patient's condition.  Labs Review Labs Reviewed  COMPREHENSIVE METABOLIC PANEL - Abnormal; Notable for the following:    Glucose, Bld 152 (*)    Creatinine, Ser 1.32 (*)    Total Protein 8.2 (*)    GFR calc non Af Amer 56 (*)    All other components within normal limits  CBC WITH DIFFERENTIAL/PLATELET - Abnormal; Notable for the following:    WBC 14.9 (*)    Neutro Abs 11.7 (*)    All other components within normal limits  CULTURE, BLOOD (ROUTINE X 2)  CULTURE, BLOOD (ROUTINE X 2)  URINE CULTURE  GASTROINTESTINAL PANEL BY PCR, STOOL (REPLACES STOOL CULTURE)  C DIFFICILE QUICK SCREEN W PCR REFLEX  URINALYSIS, ROUTINE W REFLEX MICROSCOPIC (NOT AT [facility])  I-STAT CG4 LACTIC ACID, ED  I-STAT TROPOININ, ED  I-STAT CG4 LACTIC ACID, ED  I-STAT CG4 LACTIC ACID, ED    Imaging Review Dg Chest Port 1 View  03/29/2016  CLINICAL DATA:  Sepsis hypotension EXAM: PORTABLE CHEST 1 VIEW COMPARISON:  None FINDINGS: Severe cardiac silhouette enlargement with azygos vein distension and mild pulmonary vascular congestion. No consolidation or edema appreciated. IMPRESSION: Cardiac enlargement with vascular congestion but no evidence of edema or consolidation. Electronically Signed   By: Esperanza Heir M.D.   On: 03/29/2016 15:25   I have personally reviewed and evaluated these images and lab results as part of my medical decision-making.   EKG Interpretation   Date/Time:  Thursday March 29 2016 13:35:06 EDT Ventricular Rate:  96 PR Interval:  195 QRS Duration: 180 QT Interval:  406 QTC Calculation: 513 R Axis:   -175 Text Interpretation:  Sinus or ectopic atrial rhythm Consider dextrocardia  Confirmed by Donnald Garre, MD, Lebron Conners [phone]) on 03/29/2016 3:14:55 PM     Consult:  Reviewed with intensivist Dr. Arsenio Loader  for hypotension and sepsis. Will see in the emergency department. Recommends aggressive fluid resuscitation. MDM   Final diagnoses:  Sepsis, due to unspecified organism (HCC)  Diarrhea, unspecified type  History of traumatic brain injury   Dr. Tyson Alias has evaluated the patient and recommends continuing fluid resusitation and admission to hospitalist service.He has reviewed the code status with family and at this time they have determined to change the patient's code status to DNR. At this time etiology of sepsis unclear. Patient has had recent hospitalizations with multiple antibiotic treatments. Consideration for C diff.    Arby Barrette, MD 03/29/16 1714

## 2016-03-29 NOTE — H&P (Addendum)
History and Physical  Austin GourdMichael Lenzo XLK:[id number] DOB: August 16, 1951 DOA: 03/29/2016  Referring physician: Dr. Preston FleetingGlick PCP: No primary care provider on file.   Chief Complaint: Altered mental state  Pt is coming from [facility] facility  Historians: Patient, Brother and Sister  HPI: Austin Hartman is a 65 y.o. male with a significant history of traumatic brain injury and lives at [facility].  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  He has had multiple recent admissions for sepsis syndrome.  He did have sepsis admission approximately one week ago at [facility]. Approximate 3 weeks prior to that he also was admitted there for sepsis. His today family members are with him and they are very supportive and involved.  They decided to bring him to [facility] for this admission. He had been doing well since he was discharged from [facility] until today. They sat down to noon meal and he suddenly became pale and diaphoretic and confused. His blood pressure dropped and he remained confused and was found to be hypotensive and hypothermic. Upon medic arrival the patient was cool and diaphoretic. They established an IO in the left shoulder to initiate fluid resuscitation. Patient had difficult IV access. Patient was complaining of generalized pain and was confused and altered mentally.  He was noted to have an elevated lactate of >3.  He had complained of some abdominal pain.  He has long history of chronic constipation but has had bowel movements recently per family.  He was noted today to have copious watery stool output that eventually subsided.  His family says that he has done this before with sepsis.  Pt is being admitted for stepdown ICU care.    Review of Systems: All systems reviewed and apart from history of presenting illness, are negative.  Past Medical History  Diagnosis Date  . Coronary artery disease   . Stroke (HCC)   . Hypertension   . CHF (congestive heart failure) (HCC)   .  TBI (traumatic brain injury) (HCC)   . Dysphagia    Past Surgical History  Procedure Laterality Date  . Brain surgery    . Cardiac surgery     Social History:  reports that he does not drink alcohol. His tobacco and drug histories are not on file.   Allergies  Allergen Reactions  . Acetaminophen   . Azithromycin   . Biaxin [Clarithromycin]   . Diltiazem   . Verapamil     History reviewed. No pertinent family history.    Prior to Admission medications   Medication Sig Start Date End Date Taking? Authorizing Provider  atorvastatin (LIPITOR) 20 MG tablet Take 20 mg by mouth daily.   Yes Historical Provider, MD  bethanechol (URECHOLINE) 25 MG tablet Take 25 mg by mouth 3 (three) times daily.   Yes Historical Provider, MD  cloBAZam (ONFI) 20 MG tablet Take 40 mg by mouth every evening.   Yes Historical Provider, MD  clopidogrel (PLAVIX) 75 MG tablet Take 75 mg by mouth daily.   Yes Historical Provider, MD  Cranberry 450 MG CAPS Take 450 mg by mouth daily.   Yes Historical Provider, MD  cyclobenzaprine (FLEXERIL) 5 MG tablet Take 5 mg by mouth every 12 (twelve) hours as needed for muscle spasms.   Yes Historical Provider, MD  digoxin (LANOXIN) 0.25 MG tablet Take 0.25 mg by mouth daily.   Yes Historical Provider, MD  diphenhydrAMINE (BENADRYL) 25 MG tablet Take 25 mg by mouth every 6 (six) hours  as needed for allergies.   Yes Historical Provider, MD  finasteride (PROSCAR) 5 MG tablet Take 5 mg by mouth daily.   Yes Historical Provider, MD  [facility]) CAPS capsule Take 1 capsule by mouth daily.   Yes Historical Provider, MD  furosemide (LASIX) 20 MG tablet Take 20 mg by mouth.   Yes Historical Provider, MD  ipratropium-albuterol (DUONEB) 0.5-2.5 (3) MG/3ML SOLN Take 3 mLs by nebulization 4 (four) times daily.   Yes Historical Provider, MD  lactulose, encephalopathy, (CHRONULAC) 10 GM/15ML SOLN Take 20 g by mouth 3 (three) times daily.   Yes Historical Provider, MD  lamoTRIgine  (LAMICTAL) 200 MG tablet Take 400 mg by mouth 2 (two) times daily.   Yes Historical Provider, MD  levETIRAcetam (KEPPRA) 1000 MG tablet Take 1,000 mg by mouth every evening.   Yes Historical Provider, MD  levETIRAcetam (KEPPRA) 500 MG tablet Take 1,250 mg by mouth daily.   Yes Historical Provider, MD  linaclotide (LINZESS) 145 MCG CAPS capsule Take 145 mcg by mouth daily before breakfast.   Yes Historical Provider, MD  loratadine (CLARITIN) 10 MG tablet Take 10 mg by mouth daily as needed for allergies.   Yes Historical Provider, MD  metoprolol tartrate (LOPRESSOR) 25 MG tablet Take 25 mg by mouth 3 (three) times daily.   Yes Historical Provider, MD  montelukast (SINGULAIR) 10 MG tablet Take 10 mg by mouth at bedtime.   Yes Historical Provider, MD  Multiple Vitamins-Minerals (MULTIVITAMIN PO) Take 1 tablet by mouth daily.   Yes Historical Provider, MD  oxyCODONE (OXY IR/ROXICODONE) 5 MG immediate release tablet Take 5 mg by mouth every 4 (four) hours as needed for severe pain.   Yes Historical Provider, MD  polyethylene glycol (MIRALAX / GLYCOLAX) packet Take 17 g by mouth every other day.   Yes Historical Provider, MD  promethazine (PHENERGAN) 25 MG tablet Take 25 mg by mouth every 6 (six) hours as needed for nausea or vomiting.   Yes Historical Provider, MD  sennosides-docusate sodium (SENOKOT-S) 8.6-50 MG tablet Take 2 tablets by mouth daily.   Yes Historical Provider, MD  sodium fluoride (PREVIDENT 5000 PLUS) 1.1 % CREA dental cream Place 1 application onto teeth 2 (two) times daily.   Yes Historical Provider, MD  tamsulosin (FLOMAX) 0.4 MG CAPS capsule Take 0.4 mg by mouth daily after supper.   Yes Historical Provider, MD   Physical Exam: Filed Vitals:   03/29/16 1715 03/29/16 1730 03/29/16 1745 03/29/16 1800  BP: 131/87 128/82 132/65 137/75  Pulse: 103 105 104 103  Temp:      TempSrc:      Resp: 16 19 21 21   Height:      Weight:      SpO2: 100% 100% 100% 100%     General exam: Pt  appears well nourished lying supine on the gurney in no obvious distress. Pt cooperative for exam.   Head, eyes and ENT: pronounced TBI noted;   Oral mucosa dry.   Neck: Supple. No JVD, carotid bruit or thyromegaly.  Lymphatics: No lymphadenopathy.  Respiratory system: bilateral upper airway expiratory wheeze heard.  Cardiovascular system: tachycardic rate.  Gastrointestinal system: Abdomen is mildly distended and tender to palpation, no guarding and Normal bowel sounds heard. No organomegaly or masses appreciated.  Central nervous system: awake and oriented, pronounced left hemiparesis.  Extremities:  Peripheral pulses symmetrically felt.   Skin: No rashes or acute findings.  Psychiatry: Pleasant and cooperative.   Labs on Admission:  Basic Metabolic  Panel:  Recent Labs Lab 03/29/16 1415  NA 139  K 4.4  CL 104  CO2 26  GLUCOSE 152*  BUN 12  CREATININE 1.32*  CALCIUM 10.2   Liver Function Tests:  Recent Labs Lab 03/29/16 1415  AST 31  ALT 25  ALKPHOS 116  BILITOT 0.5  PROT 8.2*  ALBUMIN 3.6   No results for input(s): LIPASE, AMYLASE in the last 168 hours. No results for input(s): AMMONIA in the last 168 hours. CBC:  Recent Labs Lab 03/29/16 1415  WBC 14.9*  NEUTROABS 11.7*  HGB 13.4  HCT 41.8  MCV 93.9  PLT 287   Cardiac Enzymes: No results for input(s): CKTOTAL, CKMB, CKMBINDEX, TROPONINI in the last 168 hours.  BNP (last 3 results) No results for input(s): PROBNP in the last 8760 hours. CBG: No results for input(s): GLUCAP in the last 168 hours.  Radiological Exams on Admission: Dg Chest Port 1 View  03/29/2016  CLINICAL DATA:  Sepsis hypotension EXAM: PORTABLE CHEST 1 VIEW COMPARISON:  None FINDINGS: Severe cardiac silhouette enlargement with azygos vein distension and mild pulmonary vascular congestion. No consolidation or edema appreciated. IMPRESSION: Cardiac enlargement with vascular congestion but no evidence of edema or consolidation.  Electronically Signed   By: Esperanza Heir M.D.   On: 03/29/2016 15:25   Assessment/Plan Active Problems:   TBI (traumatic brain injury) (HCC)   Dysphagia   CHF (congestive heart failure) (HCC)   Coronary artery disease   Diarrhea   History of traumatic brain injury   Sepsis (HCC)   Chronic constipation   Altered mental state   Hypothermia   Epilepsy (HCC)   Abdominal pain, generalized   Hypotension, unspecified   SIRS (systemic inflammatory response syndrome) (HCC)   Left hemiparesis (HCC)   At high risk for aspiration   DNR (do not resuscitate)   1. Severe Sepsis - secondary to unknown source, urinalysis and culture pending, obtain blood cultures, repeat lactate within 6 hours, sepsis protocol initiated in the ED.  I'm concerned about MRSA given his recent history.  IV fluid resuscitation ordered.   Broad spectrum antibiotics started before the blood cultures could be drawn.  Pt was assessed by PCCM team and appropriate for step down ICU care.   2. Altered Mental State - pt is rapidly improving with intensive resuscitation.  This was likely a result of the hypotension, hypothermia and sepsis. Neuro checks ordered.  3. Hypothermia - improving with warming blankets.  He did not require warmed IV fluids.  This is likely from bacteremia/sepsis.  Temp foley was placed in ED and planning to discontinue it within 24 hours if possible.  4. Hypotension - blood pressures stabilized after aggressive IVF hydration for sepsis protocol.  Will monitor closely.  No need for pressors at this time.  Family was asked and they are fine with short term use of pressors if needed.  5. 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  He is being placed on seizure precautions and will prescribe valium to control seizures.  After many years of experience caring for patient, family is adamant that valium has been the most effective treatment for  controlling the patient's seizures.   6. Abdominal Pain - CT abdomen pending at this time.  Pt does have history of chronic constipation but apparently has been having bowel movement in last few days per family.  7. Chronic Constipation - Discontinued laxatives at this time because  of copious diarrhea on admission. CT abdomen pending.  8. Diarrhea - concerned about possible C diff.  Will check C Diff PCR.  Started empirically on IV metronidazole pending c diff testing.  9. Dysphagia - Pt has been managing with elevating the head of bed with eating and drinking.  Would observe aspiration precautions.  NPO except meds for now.   10. DNR - Had a long discussion with family.  Pt has been progressively declining over recent months and they have elected to make him DNR.  They would like to continue full medical care at this time.   11. Multiple recent hospitalizations: I placed order to request medical records from [facility] for recent admissions.  12. CAD - normal troponin, no symptoms of chest pain, no acute EKG changes, resuming home plavix for now.  13. Acute Renal Insufficiency - Plan to retest BMP after hydration. Avoiding nephrotoxins.  14. Hyperglycemia - likely stress reaction, no known history of diabetes mellitus.  Checking A1c.     DVT Prophylaxis: lovenox Code Status: DNR Family Communication: spoke at length with sister and brother  Disposition Plan: anticipate return to [facility] in 3-5 days   Standley Dakins, MD [facility] Pager [phone]  If 7PM-7AM, please contact night-coverage [email or web address] Password [facility] 03/29/2016, 6:32 PM

## 2016-03-29 NOTE — Consult Note (Signed)
Pharmacy Antibiotic Note  Austin Hartman is a 65 y.o. male admitted on 03/29/2016 from [facility] with AMS, diaphoresis and hypotension. Code sepsis called. Pharmacy consulted for vancomycin and zosyn. Renal insufficiency noted with sCr 1.32 and CrCl 4279ml/min.  Plan: 1) Vancomycin 2g IV x 1 then 1g IV q12 2) Zosyn 3.375g IV q8 (4 hour infusion) 3) Follow renal function, cultures, LOT, level as needed  Height: 6' (182.9 cm) Weight: 280 lb (127.007 kg) IBW/kg (Calculated) : 77.6  Temp (24hrs), Avg:95.4 F (35.2 C), Min:95.4 F (35.2 C), Max:95.4 F (35.2 C)   Recent Labs Lab 03/29/16 1415  WBC 14.9*    CrCl cannot be calculated (Patient has no serum creatinine result on file.).    Allergies  Allergen Reactions  . Acetaminophen   . Azithromycin   . Biaxin [Clarithromycin]   . Diltiazem   . Verapamil     Antimicrobials this admission: 6/8 Vancomycin >> 6/8 Zosyn >>  Dose adjustments this admission: n/a  Microbiology results: n/a  Thank you for allowing pharmacy to be a part of this patient's care.  Fredrik RiggerMarkle, Milcah Dulany Sue 03/29/2016 2:51 PM

## 2016-03-30 ENCOUNTER — Encounter (HOSPITAL_COMMUNITY): Payer: Self-pay | Admitting: General Surgery

## 2016-03-30 ENCOUNTER — Inpatient Hospital Stay (HOSPITAL_COMMUNITY): Payer: Medicare Other

## 2016-03-30 DIAGNOSIS — T68XXXD Hypothermia, subsequent encounter: Secondary | ICD-10-CM

## 2016-03-30 DIAGNOSIS — R6520 Severe sepsis without septic shock: Secondary | ICD-10-CM

## 2016-03-30 DIAGNOSIS — D72829 Elevated white blood cell count, unspecified: Secondary | ICD-10-CM

## 2016-03-30 LAB — GLUCOSE, CAPILLARY
Glucose-Capillary: 115 mg/dL — ABNORMAL HIGH (ref 65–99)
Glucose-Capillary: 147 mg/dL — ABNORMAL HIGH (ref 65–99)

## 2016-03-30 LAB — PROTIME-INR
INR: 1.29 (ref 0.00–1.49)
Prothrombin Time: 16.2 seconds — ABNORMAL HIGH (ref 11.6–15.2)

## 2016-03-30 LAB — GASTROINTESTINAL PANEL BY PCR, STOOL (REPLACES STOOL CULTURE)
Adenovirus F40/41: NOT DETECTED
Astrovirus: NOT DETECTED
CYCLOSPORA CAYETANENSIS: NOT DETECTED
Campylobacter species: NOT DETECTED
Cryptosporidium: NOT DETECTED
E. COLI O157: NOT DETECTED
ENTAMOEBA HISTOLYTICA: NOT DETECTED
Enteroaggregative E coli (EAEC): NOT DETECTED
Enteropathogenic E coli (EPEC): NOT DETECTED
Enterotoxigenic E coli (ETEC): NOT DETECTED
Giardia lamblia: NOT DETECTED
NOROVIRUS GI/GII: NOT DETECTED
Plesimonas shigelloides: NOT DETECTED
Rotavirus A: NOT DETECTED
SALMONELLA SPECIES: NOT DETECTED
SAPOVIRUS (I, II, IV, AND V): NOT DETECTED
SHIGELLA/ENTEROINVASIVE E COLI (EIEC): NOT DETECTED
Shiga like toxin producing E coli (STEC): NOT DETECTED
VIBRIO CHOLERAE: NOT DETECTED
Vibrio species: NOT DETECTED
Yersinia enterocolitica: NOT DETECTED

## 2016-03-30 LAB — CBC WITH DIFFERENTIAL/PLATELET
BASOS ABS: 0 10*3/uL (ref 0.0–0.1)
Basophils Relative: 0 %
EOS PCT: 0 %
Eosinophils Absolute: 0 10*3/uL (ref 0.0–0.7)
HEMATOCRIT: 34.5 % — AB (ref 39.0–52.0)
Hemoglobin: 10.9 g/dL — ABNORMAL LOW (ref 13.0–17.0)
LYMPHS ABS: 1.2 10*3/uL (ref 0.7–4.0)
LYMPHS PCT: 11 %
MCH: 29.5 pg (ref 26.0–34.0)
MCHC: 31.6 g/dL (ref 30.0–36.0)
MCV: 93.5 fL (ref 78.0–100.0)
Monocytes Absolute: 0.9 10*3/uL (ref 0.1–1.0)
Monocytes Relative: 8 %
NEUTROS ABS: 8.5 10*3/uL — AB (ref 1.7–7.7)
Neutrophils Relative %: 81 %
PLATELETS: 232 10*3/uL (ref 150–400)
RBC: 3.69 MIL/uL — AB (ref 4.22–5.81)
RDW: 13.9 % (ref 11.5–15.5)
WBC: 10.6 10*3/uL — AB (ref 4.0–10.5)

## 2016-03-30 LAB — C DIFFICILE QUICK SCREEN W PCR REFLEX
C DIFFICILE (CDIFF) INTERP: NEGATIVE
C DIFFICLE (CDIFF) ANTIGEN: NEGATIVE
C Diff toxin: NEGATIVE

## 2016-03-30 LAB — COMPREHENSIVE METABOLIC PANEL
ALK PHOS: 73 U/L (ref 38–126)
ALT: 20 U/L (ref 17–63)
AST: 27 U/L (ref 15–41)
Albumin: 2.6 g/dL — ABNORMAL LOW (ref 3.5–5.0)
Anion gap: 6 (ref 5–15)
BUN: 14 mg/dL (ref 6–20)
CHLORIDE: 111 mmol/L (ref 101–111)
CO2: 24 mmol/L (ref 22–32)
CREATININE: 1.02 mg/dL (ref 0.61–1.24)
Calcium: 8.4 mg/dL — ABNORMAL LOW (ref 8.9–10.3)
GFR calc non Af Amer: >60 mL/min (ref >60)
GLUCOSE: 110 mg/dL — AB (ref 65–99)
Potassium: 3.5 mmol/L (ref 3.5–5.1)
Sodium: 141 mmol/L (ref 135–145)
Total Bilirubin: 0.9 mg/dL (ref 0.3–1.2)
Total Protein: 6 g/dL — ABNORMAL LOW (ref 6.5–8.1)

## 2016-03-30 LAB — URINE CULTURE

## 2016-03-30 LAB — LACTIC ACID, PLASMA: LACTIC ACID, VENOUS: 0.9 mmol/L (ref 0.5–2.0)

## 2016-03-30 MED ORDER — CHLORHEXIDINE GLUCONATE CLOTH 2 % EX PADS
6.0000 | MEDICATED_PAD | Freq: Every day | CUTANEOUS | Status: AC
  Administered 2016-03-30 – 2016-04-03 (×5): 6 via TOPICAL

## 2016-03-30 MED ORDER — MUPIROCIN 2 % EX OINT
1.0000 "application " | TOPICAL_OINTMENT | Freq: Two times a day (BID) | CUTANEOUS | Status: AC
  Administered 2016-03-30 – 2016-04-03 (×10): 1 via NASAL
  Filled 2016-03-30 (×3): qty 22

## 2016-03-30 MED ORDER — SODIUM CHLORIDE 0.9% FLUSH
10.0000 mL | INTRAVENOUS | Status: DC | PRN
  Administered 2016-04-02: 10 mL
  Filled 2016-03-30: qty 40

## 2016-03-30 MED ORDER — SODIUM CHLORIDE 0.9% FLUSH
10.0000 mL | Freq: Two times a day (BID) | INTRAVENOUS | Status: DC
  Administered 2016-03-30 – 2016-03-31 (×2): 10 mL

## 2016-03-30 NOTE — Progress Notes (Signed)
Lab called RN to say they were unable to get labs (blood culture and lactic acid). [place], [place] notified.

## 2016-03-30 NOTE — Progress Notes (Addendum)
Incorrect entry. Disregard..Marland Kitchen

## 2016-03-30 NOTE — Progress Notes (Signed)
Peripherally Inserted Central Catheter/Midline Placement  The IV Nurse has discussed with the patient and/or persons authorized to consent for the patient, the purpose of this procedure and the potential benefits and risks involved with this procedure.  The benefits include less needle sticks, lab draws from the catheter and patient may be discharged home with the catheter.  Risks include, but not limited to, infection, bleeding, blood clot (thrombus formation), and puncture of an artery; nerve damage and irregular heat beat.  Alternatives to this procedure were also discussed.  PICC/Midline Placement Documentation        Lisabeth DevoidGibbs, Eulalie Speights Jeanette 03/30/2016, 9:28 AM Consent obtained by Dewain Penninghristina Timmons, RN from sister at bedside

## 2016-03-30 NOTE — Progress Notes (Signed)
   Lactic acid has normalized.  WBC is down.  No indications for surgical intervention.  Continue with present management.   Edona Schreffler, ANP-BC

## 2016-03-30 NOTE — Consult Note (Signed)
Reason for Consult: abdominal pain, lactic acidosis, leukocytosis, sepsis Referring Physician: Dr. Irwin Brakeman    HPI: Austin Hartman is a 65 year old male with a history of a TBI and seizure disorder.  He is unable to provide any history due to this.  No family was present.  History was obtained through chart review.  Apparently, he has been hospitalized at outside facilities recently for sepsis.  He was in his usual state of health until yesterday at which time he developed diaphoresis and became confused(unsure what his baseline is).  He also developed copious diarrhea.  ED work up revealed,  WBC 14.9k, sCr 1.32, lactic acid 3.67, c diff was negative. He was hypothermic at 95.4, hypoxic, blood pressure was stable with mild tachycardia.    CT of abdomen and pelvis revealed rectosigmoid thickening, ischemic versus infectious.  Past Medical History  Diagnosis Date  . Coronary artery disease   . Stroke ([place])   . Hypertension   . CHF (congestive heart failure) ([place])   . TBI (traumatic brain injury) ([place])   . Dysphagia     Past Surgical History  Procedure Laterality Date  . Brain surgery    . Cardiac surgery      History reviewed. No pertinent family history.  Social History:  reports that he does not drink alcohol. His tobacco and drug histories are not on file.  Allergies:  Allergies  Allergen Reactions  . Acetaminophen   . Azithromycin   . Biaxin [Clarithromycin]   . Diltiazem   . Verapamil     Medications:  Scheduled Meds: . acidophilus  1 capsule Oral Daily  . antiseptic oral rinse  7 mL Mouth Rinse BID  . atorvastatin  20 mg Oral Daily  . bethanechol  25 mg Oral TID  . Chlorhexidine Gluconate Cloth  6 each Topical Q0600  . cloBAZam  40 mg Oral QPM  . clopidogrel  75 mg Oral Daily  . digoxin  0.25 mg Oral Daily  . enoxaparin (LOVENOX) injection  40 mg Subcutaneous Q24H  . finasteride  5 mg Oral Daily  . ipratropium-albuterol  3 mL Nebulization QID  .  lamoTRIgine  400 mg Oral BID  . levETIRAcetam  1,000 mg Oral QPM  . levETIRAcetam  1,250 mg Oral q morning - 10a  . metoprolol tartrate  25 mg Oral TID  . metronidazole  500 mg Intravenous Q8H  . montelukast  10 mg Oral QHS  . mupirocin ointment  1 application Nasal BID  . piperacillin-tazobactam (ZOSYN)  IV  3.375 g Intravenous Q8H  . sodium chloride flush  10-40 mL Intracatheter Q12H  . tamsulosin  0.4 mg Oral QPC supper  . vancomycin  1,000 mg Intravenous Q12H   Continuous Infusions: . sodium chloride 75 mL/hr at 03/29/16 2100   PRN Meds:.cyclobenzaprine, diazepam, ondansetron **OR** ondansetron (ZOFRAN) IV, senna-docusate, sodium chloride flush   Results for orders placed or performed during the hospital encounter of 03/29/16 (from the past 48 hour(s))  Comprehensive metabolic panel     Status: Abnormal   Collection Time: 03/29/16  2:15 PM  Result Value Ref Range   Sodium 139 135 - 145 mmol/L   Potassium 4.4 3.5 - 5.1 mmol/L   Chloride 104 101 - 111 mmol/L   CO2 26 22 - 32 mmol/L   Glucose, Bld 152 (H) 65 - 99 mg/dL   BUN 12 6 - 20 mg/dL   Creatinine, Ser 1.32 (H) 0.61 - 1.24 mg/dL   Calcium 10.2 8.9 -  10.3 mg/dL   Total Protein 8.2 (H) 6.5 - 8.1 g/dL   Albumin 3.6 3.5 - 5.0 g/dL   AST 31 15 - 41 U/L   ALT 25 17 - 63 U/L   Alkaline Phosphatase 116 38 - 126 U/L   Total Bilirubin 0.5 0.3 - 1.2 mg/dL   GFR calc non Af Amer 56 (L) >60 mL/min   GFR calc Af Amer >60 >60 mL/min    Comment: (NOTE) The eGFR has been calculated using the CKD EPI equation. This calculation has not been validated in all clinical situations. eGFR's persistently <60 mL/min signify possible Chronic Kidney Disease.    Anion gap 9 5 - 15  CBC with Differential     Status: Abnormal   Collection Time: 03/29/16  2:15 PM  Result Value Ref Range   WBC 14.9 (H) 4.0 - 10.5 K/uL   RBC 4.45 4.22 - 5.81 MIL/uL   Hemoglobin 13.4 13.0 - 17.0 g/dL   HCT 41.8 39.0 - 52.0 %   MCV 93.9 78.0 - 100.0 fL   MCH  30.1 26.0 - 34.0 pg   MCHC 32.1 30.0 - 36.0 g/dL   RDW 13.6 11.5 - 15.5 %   Platelets 287 150 - 400 K/uL   Neutrophils Relative % 79 %   Neutro Abs 11.7 (H) 1.7 - 7.7 K/uL   Lymphocytes Relative 15 %   Lymphs Abs 2.3 0.7 - 4.0 K/uL   Monocytes Relative 5 %   Monocytes Absolute 0.7 0.1 - 1.0 K/uL   Eosinophils Relative 1 %   Eosinophils Absolute 0.1 0.0 - 0.7 K/uL   Basophils Relative 0 %   Basophils Absolute 0.0 0.0 - 0.1 K/uL  I-stat troponin, ED     Status: None   Collection Time: 03/29/16  2:31 PM  Result Value Ref Range   Troponin i, poc 0.00 0.00 - 0.08 ng/mL   Comment 3            Comment: Due to the release kinetics of cTnI, a negative result within the first hours of the onset of symptoms does not rule out myocardial infarction with certainty. If myocardial infarction is still suspected, repeat the test at appropriate intervals.   I-Stat CG4 Lactic Acid, ED     Status: Abnormal   Collection Time: 03/29/16  6:11 PM  Result Value Ref Range   Lactic Acid, Venous 3.67 (HH) 0.5 - 2.0 mmol/L   Comment NOTIFIED PHYSICIAN   Urinalysis, Routine w reflex microscopic     Status: Abnormal   Collection Time: 03/29/16  8:11 PM  Result Value Ref Range   Color, Urine AMBER (A) YELLOW    Comment: BIOCHEMICALS MAY BE AFFECTED BY COLOR   APPearance CLOUDY (A) CLEAR   Specific Gravity, Urine 1.025 1.005 - 1.030   pH 5.0 5.0 - 8.0   Glucose, UA NEGATIVE NEGATIVE mg/dL   Hgb urine dipstick NEGATIVE NEGATIVE   Bilirubin Urine SMALL (A) NEGATIVE   Ketones, ur 15 (A) NEGATIVE mg/dL   Protein, ur NEGATIVE NEGATIVE mg/dL   Nitrite NEGATIVE NEGATIVE   Leukocytes, UA TRACE (A) NEGATIVE  Urine microscopic-add on     Status: Abnormal   Collection Time: 03/29/16  8:11 PM  Result Value Ref Range   Squamous Epithelial / LPF 0-5 (A) NONE SEEN   WBC, UA 0-5 0 - 5 WBC/hpf   RBC / HPF NONE SEEN 0 - 5 RBC/hpf   Bacteria, UA RARE (A) NONE SEEN   Casts HYALINE CASTS (  A) NEGATIVE   Crystals CA  OXALATE CRYSTALS (A) NEGATIVE  MRSA PCR Screening     Status: Abnormal   Collection Time: 03/29/16  9:46 PM  Result Value Ref Range   MRSA by PCR POSITIVE (A) NEGATIVE    Comment: RESULT CALLED TO, READ BACK BY AND VERIFIED WITH: RN B REAT AT 2300 [id number] MARTINB        The GeneXpert MRSA Assay (FDA approved for NASAL specimens only), is one component of a comprehensive MRSA colonization surveillance program. It is not intended to diagnose MRSA infection nor to guide or monitor treatment for MRSA infections.   C difficile quick scan w PCR reflex     Status: None   Collection Time: 03/30/16  6:32 AM  Result Value Ref Range   C Diff antigen NEGATIVE NEGATIVE   C Diff toxin NEGATIVE NEGATIVE   C Diff interpretation Negative for toxigenic C. difficile     US Abdomen Complete  03/30/2016  CLINICAL DATA:  Abdominal pain EXAM: ABDOMEN ULTRASOUND COMPLETE COMPARISON:  CT from earlier the same day FINDINGS: Gallbladder: Surgically absent Common bile duct: Diameter: 7 mm. Where visualized, no filling defect. Liver: No focal lesion identified. Within normal limits in parenchymal echogenicity. IVC: No abnormality visualized. Pancreas: Not visualized but without acute finding or mass on previous CT. Spleen: Size and appearance within normal limits. Right Kidney: Length: 10 cm. Cortical thinning with normal echogenicity. No hydronephrosis. Left Kidney: Length: 11 cm. Cortical thinning with normal echogenicity. No hydronephrosis. Abdominal aorta: Obscured at the mid and distal segments. No acute finding on previous CT. No proximal aneurysm. IMPRESSION: 1. No explanation for abdominal pain. 2. Multiple areas are poorly visualized but negative for acute finding on preceding CT. Electronically Signed   By: Monte Fantasia M.D.   On: 03/30/2016 04:56   Ct Abdomen Pelvis W Contrast  03/30/2016  CLINICAL DATA:  Abdominal pain.  History of hypertension. EXAM: CT ABDOMEN AND PELVIS WITH CONTRAST TECHNIQUE:  Multidetector CT imaging of the abdomen and pelvis was performed using the standard protocol following bolus administration of intravenous contrast. CONTRAST:  1 ISOVUE-300 IOPAMIDOL (ISOVUE-300) INJECTION 61% COMPARISON:  None. FINDINGS: Lower chest and abdominal wall:  Symmetric gynecomastia. Probable previous right inguinal hernia repair. Cardiomegaly and coronary atherosclerosis. Hepatobiliary: No focal liver abnormality.Cholecystectomy with normal common bile duct diameter Pancreas: Unremarkable. Spleen: Unremarkable. Adrenals/Urinary Tract: Mildly lobulated left adrenal gland without discrete nodule. No hydronephrosis or stone. The bladder is decompressed by Foley catheter. Reproductive:Negative Stomach/Bowel: Redundant sigmoid colon with wandering course. The rectum and distal sigmoid has circumferential wall thickening and mild sigmoid mesenteric inflammatory change. The mesenteric vessels are somewhat distorted but there is no volvulus or obstruction. No small bowel dilatation. No appendicitis. No signs of bowel necrosis or perforation. Vascular/Lymphatic: No acute vascular finding. To level permitted by technique, the IMV and IMA are patent. Intravenous gas at the level of the left groin that is likely from vascular access. No mass or adenopathy. Peritoneal: No ascites or pneumoperitoneum. Musculoskeletal: Sclerotic density in the L5 vertebra is likely a bone island. Remote T11, L1, and L4 superior endplate fractures. IMPRESSION: Rectosigmoid thickening could be ischemic or infectious. Elongated and tortuous sigmoid colon without obstruction or volvulus. No other potential source of sepsis is identified. Incidental findings noted above. Electronically Signed   By: Monte Fantasia M.D.   On: 03/30/2016 00:50   Dg Chest Port 1 View  03/29/2016  CLINICAL DATA:  Sepsis hypotension EXAM: PORTABLE CHEST 1 VIEW COMPARISON:  None  FINDINGS: Severe cardiac silhouette enlargement with azygos vein distension and  mild pulmonary vascular congestion. No consolidation or edema appreciated. IMPRESSION: Cardiac enlargement with vascular congestion but no evidence of edema or consolidation. Electronically Signed   By: Skipper Cliche M.D.   On: 03/29/2016 15:25    Review of Systems  Unable to perform ROS  Blood pressure 121/73, pulse 114, temperature 98.6 F (37 C), temperature source Axillary, resp. rate 21, height 6' 4"  (1.93 m), weight 129 kg (284 lb 6.3 oz), SpO2 100 %. Physical Exam  Constitutional: He appears well-developed and well-nourished. No distress.  Cardiovascular: Normal rate, regular rhythm, normal heart sounds and intact distal pulses.  Exam reveals no gallop and no friction rub.   No murmur heard. tachy  Respiratory: Effort normal. No respiratory distress. He has no wheezes. He has no rales. He exhibits no tenderness.  GI: Soft. Bowel sounds are normal. He exhibits no distension and no mass. There is no rebound.  Diffuse tenderness with voluntary guarding, soft   Skin: Skin is warm and dry. No rash noted. He is not diaphoretic. No erythema. No pallor.  Psychiatric: He has a normal mood and affect. His behavior is normal. Judgment and thought content normal.    Assessment/Plan: Abdominal pain, leukocytosis, lactic acidosis, tachycardia Rectosigmoid thickening, ischemic versus infectious   CT reviewed, favor more infectious process versus ischemia.  Will repeat CBC, CMP, lactic acid.  Continue with zosyn and Vanc, DC flagyl.  Continue bowel, rehydrate and follow labs and clinical exam.  Should the patient acutely worsen and require surgery, will have to discuss further with family.  Thank you for the consult.  Will follow along.   Kristien Salatino ANP-BC 03/30/2016, 10:07 AM

## 2016-03-30 NOTE — Evaluation (Signed)
Clinical/Bedside Swallow Evaluation Patient Details  Name: Austin Hartman MRN: [id number] Date of Birth: September 27, 1951  Today's Date: 03/30/2016 Time: SLP Start Time (ACUTE ONLY): 1350 SLP Stop Time (ACUTE ONLY): 1425 SLP Time Calculation (min) (ACUTE ONLY): 35 min  Past Medical History:  Past Medical History  Diagnosis Date  . Coronary artery disease   . Stroke ([facility])   . Hypertension   . CHF (congestive heart failure) ([facility])   . TBI (traumatic brain injury) ([facility])   . Dysphagia    Past Surgical History:  Past Surgical History  Procedure Laterality Date  . Brain surgery    . Cardiac surgery     HPI:  65 year old male admitted with severe sepsis. PMH significant for TBI (1974), CVA, seizures, CHF, dysphagia   Assessment / Plan / Recommendation Clinical Impression  Oral care completed with suction. Pt presents with mild oral motor weakness with consequent mild dysarthria. Adequate oral prep and propulsion of po trials, and no overt s/s aspiration of any consistency. Sister was present during evaluation, and indicated pt had undergone MBS a few months ago, with recommendation for chopped solids and thin liquids. Pt appears to continue appropriate for this diet level. Will begin dys 2 diet with thin liquids. Recommend adherence [facility] to follow for diet tolerance and readiness to advance. RN and MD notified of results and recommendations.    Aspiration Risk  Mild aspiration risk    Diet Recommendation Thin liquid;Dysphagia 2 (Fine chop)   Liquid Administration via: Straw;Cup Medication Administration: Whole meds with liquid Supervision: Staff to assist with self feeding;Full supervision/cueing for compensatory strategies Compensations: Minimize environmental distractions;Slow rate;Small sips/bites Postural Changes: Seated upright at 90 degrees;Remain upright for at least 30 minutes after po intake    Other  Recommendations Oral Care  Recommendations: Oral care BID Other Recommendations: Have oral suction available   Follow up Recommendations   (continue services per primary SLP at facility where pt lives)    Frequency and Duration min 1 x/week  2 weeks       Prognosis Prognosis for Safe Diet Advancement: Fair Barriers to Reach Goals: Cognitive deficits;Time post onset      Swallow Study   General Date of Onset: 03/29/16 HPI: 65 year old male admitted with severe sepsis. PMH significant for TBI (1974), CVA, seizures, CHF, dysphagia Type of Study: Bedside Swallow Evaluation Previous Swallow Assessment: MBS completed ~March 2017 with recommendation for chopped diet, thin liquids Diet Prior to this Study: NPO Temperature Spikes Noted: No Respiratory Status: Room air History of Recent Intubation: No Behavior/Cognition: Alert;Cooperative;Pleasant mood;Doesn't follow directions;Requires cueing Oral Cavity Assessment: Within Functional Limits Oral Care Completed by SLP: Yes Oral Cavity - Dentition: Missing dentition Self-Feeding Abilities: Needs assist (impulsive) Patient Positioning: Upright in bed Baseline Vocal Quality: Normal Volitional Cough: Cognitively unable to elicit Volitional Swallow: Unable to elicit    Oral/Motor/Sensory Function Overall Oral Motor/Sensory Function: Mild impairment   Ice Chips Ice chips: Within functional limits Presentation: Spoon   Thin Liquid Thin Liquid: Within functional limits Presentation: Straw    Nectar Thick Nectar Thick Liquid: Not tested   Honey Thick Honey Thick Liquid: Not tested   Puree Puree: Within functional limits Presentation: Spoon   Solid    Solid: Within functional limits (softened graham cracker)       [name], [name] 03/30/2016,2:34 PM  [name], [facility]P, CCC-SLP [phone]

## 2016-03-30 NOTE — Progress Notes (Signed)
PROGRESS NOTE    Austin Hartman  ZOX:[id number]  DOB: 30-May-1951  DOA: 03/29/2016 PCP: No primary care provider on file. Outpatient Specialists:  Hospital course: Austin Hartman is a 65 y.o. male with a significant history of traumatic brain injury and lives at [facility]. 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 He has had multiple recent admissions for sepsis syndrome. He did have sepsis admission approximately one week ago at [facility]. Approximate 3 weeks prior to that he also was admitted there for sepsis. His today family members are with him and they are very supportive and involved. They decided to bring him to [facility] for this admission. He had been doing well since he was discharged from [place] until today. They sat down to noon meal and he suddenly became pale and diaphoretic and confused. His blood pressure dropped and he remained confused and was found to be hypotensive and hypothermic. Upon medic arrival the patient was cool and diaphoretic. They established an IO in the left shoulder to initiate fluid resuscitation. Patient had difficult IV access. Patient was complaining of generalized pain and was confused and altered mentally. He was noted to have an elevated lactate of >3. He had complained of some abdominal pain. He has long history of chronic constipation but has had bowel movements recently per family. He was noted today to have copious watery stool output that eventually subsided. His family says that he has done this before with sepsis. Pt is being admitted for stepdown ICU care.   Assessment & Plan:  Severe Sepsis - secondary to unknown source, urinalysis culture pending, following blood cultures, repeat lactate ordered, clinically patient has been improving. Possible GI source of infection given CT findings.  I asked for general surgery to evaluate. Broad spectrum antibiotics will be continued for now.  Pt was assessed by PCCM team and appropriate for step  down ICU care.   Altered Mental State - improved with intensive resuscitation. This was likely a result of the hypotension, hypothermia and sepsis. Neuro checks ordered.   Hypothermia - resolved now.  He did not require warmed IV fluids. This was likely from bacteremia/sepsis. Temp foley was placed in ED and planning to discontinue it within 24 hours if possible.   Hypotension - blood pressures stabilized after aggressive IVF hydration for sepsis protocol. Will monitor closely. No need for pressors at this time. Family was asked and they are fine with short term use of pressors if needed.   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 He is being placed on seizure precautions and will prescribe valium to control seizures. After many years of experience caring for patient, family is adamant that valium has been the most effective treatment for controlling the patient's seizures.   Abdominal Pain - CT abdomen: ischemia versus infection. Pt does have history of chronic constipation but apparently has been having bowel movement in last few days per family. C. Diff negative.   Chronic Constipation - Discontinued laxatives at this time because of copious diarrhea on admission but will resume when appropriate.   Diarrhea - C diff negative.    Dysphagia - Pt has been managing with elevating the head of bed with eating and drinking. Would observe aspiration precautions. NPO except meds for now. Consult SLP.   DNR - Had a long discussion with family. Pt has been progressively declining over recent months and they have elected to make him DNR. They would  like to continue full medical care at this time.   Multiple recent hospitalizations: I placed order to request medical records from [facility] for recent admissions.   CAD - normal troponin, no symptoms of chest pain, no acute EKG changes, resuming  home plavix for now.   Acute Renal Insufficiency - Plan to retest BMP after hydration. Avoiding nephrotoxins.   Hyperglycemia - likely stress reaction, no known history of diabetes mellitus. Checking A1c.   DVT Prophylaxis: lovenox Code Status: DNR Family Communication: spoke at length with sister and brother  Disposition Plan: anticipate return to [facility] in 3-5 days  Consultants:  General Surgery  Procedures:  Picc Line Placement 03/30/16  Antimicrobials: Anti-infectives    Start     Dose/Rate Route Frequency Ordered Stop   03/30/16 0330  vancomycin (VANCOCIN) IVPB 1000 mg/200 mL premix     1,000 mg 200 mL/hr over 60 Minutes Intravenous Every 12 hours 03/29/16 1506     03/29/16 2300  piperacillin-tazobactam (ZOSYN) IVPB 3.375 g     3.375 g 12.5 mL/hr over 240 Minutes Intravenous Every 8 hours 03/29/16 1506     03/29/16 1730  metroNIDAZOLE (FLAGYL) IVPB 500 mg     500 mg 100 mL/hr over 60 Minutes Intravenous Every 8 hours 03/29/16 1720     03/29/16 1500  piperacillin-tazobactam (ZOSYN) IVPB 3.375 g     3.375 g 100 mL/hr over 30 Minutes Intravenous  Once 03/29/16 1446 03/29/16 1547   03/29/16 1500  vancomycin (VANCOCIN) IVPB 1000 mg/200 mL premix  Status:  Discontinued     1,000 mg 200 mL/hr over 60 Minutes Intravenous  Once 03/29/16 1446 03/29/16 1447   03/29/16 1500  vancomycin (VANCOCIN) 2,000 mg in sodium chloride 0.9 % 500 mL IVPB     2,000 mg 250 mL/hr over 120 Minutes Intravenous  Once 03/29/16 1447 03/29/16 1923     Subjective: Pt had a seizure overnight, received PICC line this morning, lab was unable to obtain his morning labs  Objective: Filed Vitals:   03/30/16 0500 03/30/16 0600 03/30/16 0739 03/30/16 0743  BP: 126/71 128/81  121/73  Pulse: 108 113  114  Temp:  99.3 F (37.4 C)  98.6 F (37 C)  TempSrc:  Core (Comment)  Axillary  Resp: 21 20  21   Height:      Weight:      SpO2: 99% 100% 100% 100%    Intake/Output Summary (Last 24  hours) at 03/30/16 0934 Last data filed at 03/30/16 0631  Gross per 24 hour  Intake   2175 ml  Output    565 ml  Net   1610 ml   Filed Weights   03/29/16 1357 03/29/16 1434 03/29/16 2122  Weight: 260 lb (117.935 kg) 280 lb (127.007 kg) 284 lb 6.3 oz (129 kg)    Exam:   General exam: Pt appears well nourished lying supine on the gurney in no obvious distress. Pt cooperative for exam.   Head, eyes and ENT: pronounced TBI noted; Oral mucosa dry.   Neck: Supple. No JVD, carotid bruit or thyromegaly.  Lymphatics: No lymphadenopathy.  Respiratory system: bilateral upper airway expiratory wheeze heard.  Cardiovascular system: tachycardic rate.  Gastrointestinal system: Abdomen is mildly distended and tender to palpation, no guarding and Normal bowel sounds heard. No organomegaly or masses appreciated.  Central nervous system: awake and oriented, pronounced left hemiparesis.  Extremities: Peripheral pulses symmetrically felt.   Skin: No rashes or acute findings.  Psychiatry: Pleasant and cooperative  Data  Reviewed: Basic Metabolic Panel:  Recent Labs Lab 03/29/16 1415  NA 139  K 4.4  CL 104  CO2 26  GLUCOSE 152*  BUN 12  CREATININE 1.32*  CALCIUM 10.2   Liver Function Tests:  Recent Labs Lab 03/29/16 1415  AST 31  ALT 25  ALKPHOS 116  BILITOT 0.5  PROT 8.2*  ALBUMIN 3.6   No results for input(s): LIPASE, AMYLASE in the last 168 hours. No results for input(s): AMMONIA in the last 168 hours. CBC:  Recent Labs Lab 03/29/16 1415  WBC 14.9*  NEUTROABS 11.7*  HGB 13.4  HCT 41.8  MCV 93.9  PLT 287   Cardiac Enzymes: No results for input(s): CKTOTAL, CKMB, CKMBINDEX, TROPONINI in the last 168 hours. BNP (last 3 results) No results for input(s): PROBNP in the last 8760 hours. CBG: No results for input(s): GLUCAP in the last 168 hours.  Recent Results (from the past 240 hour(s))  MRSA PCR Screening     Status: Abnormal   Collection Time:  03/29/16  9:46 PM  Result Value Ref Range Status   MRSA by PCR POSITIVE (A) NEGATIVE Final    Comment: RESULT CALLED TO, READ BACK BY AND VERIFIED WITH: RN B REAT AT 2300 [id number] MARTINB        The GeneXpert MRSA Assay (FDA approved for NASAL specimens only), is one component of a comprehensive MRSA colonization surveillance program. It is not intended to diagnose MRSA infection nor to guide or monitor treatment for MRSA infections.   C difficile quick scan w PCR reflex     Status: None   Collection Time: 03/30/16  6:32 AM  Result Value Ref Range Status   C Diff antigen NEGATIVE NEGATIVE Final   C Diff toxin NEGATIVE NEGATIVE Final   C Diff interpretation Negative for toxigenic C. difficile  Final     Studies: US Abdomen Complete  03/30/2016  CLINICAL DATA:  Abdominal pain EXAM: ABDOMEN ULTRASOUND COMPLETE COMPARISON:  CT from earlier the same day FINDINGS: Gallbladder: Surgically absent Common bile duct: Diameter: 7 mm. Where visualized, no filling defect. Liver: No focal lesion identified. Within normal limits in parenchymal echogenicity. IVC: No abnormality visualized. Pancreas: Not visualized but without acute finding or mass on previous CT. Spleen: Size and appearance within normal limits. Right Kidney: Length: 10 cm. Cortical thinning with normal echogenicity. No hydronephrosis. Left Kidney: Length: 11 cm. Cortical thinning with normal echogenicity. No hydronephrosis. Abdominal aorta: Obscured at the mid and distal segments. No acute finding on previous CT. No proximal aneurysm. IMPRESSION: 1. No explanation for abdominal pain. 2. Multiple areas are poorly visualized but negative for acute finding on preceding CT. Electronically Signed   By: Marnee Spring M.D.   On: 03/30/2016 04:56   Ct Abdomen Pelvis W Contrast  03/30/2016  CLINICAL DATA:  Abdominal pain.  History of hypertension. EXAM: CT ABDOMEN AND PELVIS WITH CONTRAST TECHNIQUE: Multidetector CT imaging of the abdomen and  pelvis was performed using the standard protocol following bolus administration of intravenous contrast. CONTRAST:  1 ISOVUE-300 IOPAMIDOL (ISOVUE-300) INJECTION 61% COMPARISON:  None. FINDINGS: Lower chest and abdominal wall:  Symmetric gynecomastia. Probable previous right inguinal hernia repair. Cardiomegaly and coronary atherosclerosis. Hepatobiliary: No focal liver abnormality.Cholecystectomy with normal common bile duct diameter Pancreas: Unremarkable. Spleen: Unremarkable. Adrenals/Urinary Tract: Mildly lobulated left adrenal gland without discrete nodule. No hydronephrosis or stone. The bladder is decompressed by Foley catheter. Reproductive:Negative Stomach/Bowel: Redundant sigmoid colon with wandering course. The rectum and distal sigmoid has circumferential wall  thickening and mild sigmoid mesenteric inflammatory change. The mesenteric vessels are somewhat distorted but there is no volvulus or obstruction. No small bowel dilatation. No appendicitis. No signs of bowel necrosis or perforation. Vascular/Lymphatic: No acute vascular finding. To level permitted by technique, the IMV and IMA are patent. Intravenous gas at the level of the left groin that is likely from vascular access. No mass or adenopathy. Peritoneal: No ascites or pneumoperitoneum. Musculoskeletal: Sclerotic density in the L5 vertebra is likely a bone island. Remote T11, L1, and L4 superior endplate fractures. IMPRESSION: Rectosigmoid thickening could be ischemic or infectious. Elongated and tortuous sigmoid colon without obstruction or volvulus. No other potential source of sepsis is identified. Incidental findings noted above. Electronically Signed   By: Marnee SpringJonathon  Watts M.D.   On: 03/30/2016 00:50   Dg Chest Port 1 View  03/29/2016  CLINICAL DATA:  Sepsis hypotension EXAM: PORTABLE CHEST 1 VIEW COMPARISON:  None FINDINGS: Severe cardiac silhouette enlargement with azygos vein distension and mild pulmonary vascular congestion. No  consolidation or edema appreciated. IMPRESSION: Cardiac enlargement with vascular congestion but no evidence of edema or consolidation. Electronically Signed   By: Esperanza Heiraymond  Rubner M.D.   On: 03/29/2016 15:25     Scheduled Meds: . acidophilus  1 capsule Oral Daily  . antiseptic oral rinse  7 mL Mouth Rinse BID  . atorvastatin  20 mg Oral Daily  . bethanechol  25 mg Oral TID  . Chlorhexidine Gluconate Cloth  6 each Topical Q0600  . cloBAZam  40 mg Oral QPM  . clopidogrel  75 mg Oral Daily  . digoxin  0.25 mg Oral Daily  . enoxaparin (LOVENOX) injection  40 mg Subcutaneous Q24H  . finasteride  5 mg Oral Daily  . ipratropium-albuterol  3 mL Nebulization QID  . lamoTRIgine  400 mg Oral BID  . levETIRAcetam  1,000 mg Oral QPM  . levETIRAcetam  1,250 mg Oral q morning - 10a  . metoprolol tartrate  25 mg Oral TID  . metronidazole  500 mg Intravenous Q8H  . montelukast  10 mg Oral QHS  . mupirocin ointment  1 application Nasal BID  . piperacillin-tazobactam (ZOSYN)  IV  3.375 g Intravenous Q8H  . tamsulosin  0.4 mg Oral QPC supper  . vancomycin  1,000 mg Intravenous Q12H   Continuous Infusions: . sodium chloride 75 mL/hr at 03/29/16 2100    Principal Problem:   Severe sepsis (HCC) Active Problems:   Sepsis (HCC)   Hypothermia   Epilepsy (HCC)   Abdominal pain, generalized   Hypotension, unspecified   DNR (do not resuscitate)   Leukocytosis   TBI (traumatic brain injury) (HCC)   Dysphagia   Coronary artery disease   Diarrhea   History of traumatic brain injury   Chronic constipation   Altered mental state   At high risk for aspiration   CHF (congestive heart failure) (HCC)   Left hemiparesis [facility](HCC)   Critical Care Time spent: 40 mins   Standley Dakinslanford Johnson, MD, FAAFP [facility] Pager [phone]  If 7PM-7AM, please contact night-coverage [email or web address] Password [email or web address] 03/30/2016, 9:34 AM    LOS: 1 day

## 2016-03-31 LAB — CBC
HEMATOCRIT: 33.9 % — AB (ref 39.0–52.0)
Hemoglobin: 10.6 g/dL — ABNORMAL LOW (ref 13.0–17.0)
MCH: 29.8 pg (ref 26.0–34.0)
MCHC: 31.3 g/dL (ref 30.0–36.0)
MCV: 95.2 fL (ref 78.0–100.0)
Platelets: 218 10*3/uL (ref 150–400)
RBC: 3.56 MIL/uL — ABNORMAL LOW (ref 4.22–5.81)
RDW: 14 % (ref 11.5–15.5)
WBC: 9.1 10*3/uL (ref 4.0–10.5)

## 2016-03-31 LAB — BASIC METABOLIC PANEL
ANION GAP: 6 (ref 5–15)
BUN: 12 mg/dL (ref 6–20)
CALCIUM: 8.2 mg/dL — AB (ref 8.9–10.3)
CO2: 23 mmol/L (ref 22–32)
Chloride: 109 mmol/L (ref 101–111)
Creatinine, Ser: 0.98 mg/dL (ref 0.61–1.24)
GFR calc Af Amer: >60 mL/min (ref >60)
Glucose, Bld: 90 mg/dL (ref 65–99)
POTASSIUM: 3.5 mmol/L (ref 3.5–5.1)
SODIUM: 138 mmol/L (ref 135–145)

## 2016-03-31 LAB — VANCOMYCIN, TROUGH: VANCOMYCIN TR: 14 ug/mL (ref 10.0–20.0)

## 2016-03-31 MED ORDER — IPRATROPIUM-ALBUTEROL 0.5-2.5 (3) MG/3ML IN SOLN
3.0000 mL | Freq: Three times a day (TID) | RESPIRATORY_TRACT | Status: DC
  Administered 2016-03-31 – 2016-04-01 (×3): 3 mL via RESPIRATORY_TRACT
  Filled 2016-03-31 (×4): qty 3

## 2016-03-31 MED ORDER — VANCOMYCIN HCL 10 G IV SOLR
1250.0000 mg | Freq: Two times a day (BID) | INTRAVENOUS | Status: DC
  Administered 2016-04-01 – 2016-04-03 (×5): 1250 mg via INTRAVENOUS
  Filled 2016-03-31 (×7): qty 1250

## 2016-03-31 NOTE — Progress Notes (Signed)
PROGRESS NOTE    Austin Hartman  WUJ:[id number]  DOB: [birth year]  DOA: 03/29/2016 PCP: No primary care provider on file. Outpatient Specialists:  Hospital course: Austin Hartman is a 65 y.o. male with a significant history of traumatic brain injury and lives at [facility]. 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 He has had multiple recent admissions for sepsis syndrome. He did have sepsis admission approximately one week ago at [facility]. Approximate 3 weeks prior to that he also was admitted there for sepsis. His today family members are with him and they are very supportive and involved. They decided to bring him to [facility] for this admission. He had been doing well since he was discharged from [facility] until today. They sat down to noon meal and he suddenly became pale and diaphoretic and confused. His blood pressure dropped and he remained confused and was found to be hypotensive and hypothermic. Upon medic arrival the patient was cool and diaphoretic. They established an IO in the left shoulder to initiate fluid resuscitation. Patient had difficult IV access. Patient was complaining of generalized pain and was confused and altered mentally. He was noted to have an elevated lactate of >3. He had complained of some abdominal pain. He has long history of chronic constipation but has had bowel movements recently per family. He was noted today to have copious watery stool output that eventually subsided. His family says that he has done this before with sepsis. Pt is being admitted for stepdown ICU care.   Assessment & Plan:  Severe Sepsis - secondary to GI infection. Clinically improving.  following blood cultures, neg to date,   clinically patient has been improving. Possible GI source of infection given CT findings.  I asked for general surgery to evaluate. Broad spectrum IV antibiotics will be continued for now.  Transfer to telemetry.   Altered Mental State - improved with  intensive resuscitation. This was likely a result of the hypotension, hypothermia and sepsis. Neuro checks ordered.   Hypothermia - resolved now.  He did not require warmed IV fluids. This was likely from bacteremia/sepsis. Temp foley was placed in ED and planning to discontinue it within 24 hours if possible.   Hypotension - blood pressures stabilized after aggressive IVF hydration for sepsis protocol. Will monitor closely. No need for pressors at this time. Family was asked and they are fine with short term use of pressors if needed.   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 He is being placed on seizure precautions and will prescribe valium to control seizures. After many years of experience caring for patient, family is adamant that valium has been the most effective treatment for controlling the patient's seizures.   Abdominal Pain - CT abdomen: ischemia versus infection. Pt does have history of chronic constipation but apparently has been having bowel movement in last few days per family. C. Diff negative.   Chronic Constipation - Discontinued laxatives at this time because of copious diarrhea on admission but will resume when appropriate.   Diarrhea - C diff negative.    Dysphagia - Pt has been managing with elevating the head of bed with eating and drinking. Would observe aspiration precautions. Consult SLP.   DNR - Had a long discussion with family. Pt has been progressively declining over recent months and they have elected to make him DNR. They would like to continue full medical care at this time.   Multiple  recent hospitalizations: I placed order to request medical records from [facility] for recent admissions.   CAD - normal troponin, no symptoms of chest pain, no acute EKG changes, resuming home plavix for now.   Acute Renal Insufficiency - Normalized after hydration.    Hyperglycemia - likely stress reaction, no known history of diabetes mellitus. BS better now. Checking A1c.   DVT Prophylaxis: lovenox Code Status: DNR Family Communication: spoke at length youngest brother  Disposition Plan: anticipate return to [facility] in 3-5 days  Consultants:  General Surgery  Procedures:  Picc Line Placement 03/30/16  Antimicrobials: Anti-infectives    Start     Dose/Rate Route Frequency Ordered Stop   03/30/16 0330  vancomycin (VANCOCIN) IVPB 1000 mg/200 mL premix     1,000 mg 200 mL/hr over 60 Minutes Intravenous Every 12 hours 03/29/16 1506     03/29/16 2300  piperacillin-tazobactam (ZOSYN) IVPB 3.375 g     3.375 g 12.5 mL/hr over 240 Minutes Intravenous Every 8 hours 03/29/16 1506     03/29/16 1730  metroNIDAZOLE (FLAGYL) IVPB 500 mg  Status:  Discontinued     500 mg 100 mL/hr over 60 Minutes Intravenous Every 8 hours 03/29/16 1720 03/30/16 1018   03/29/16 1500  piperacillin-tazobactam (ZOSYN) IVPB 3.375 g     3.375 g 100 mL/hr over 30 Minutes Intravenous  Once 03/29/16 1446 03/29/16 1547   03/29/16 1500  vancomycin (VANCOCIN) IVPB 1000 mg/200 mL premix  Status:  Discontinued     1,000 mg 200 mL/hr over 60 Minutes Intravenous  Once 03/29/16 1446 03/29/16 1447   03/29/16 1500  vancomycin (VANCOCIN) 2,000 mg in sodium chloride 0.9 % 500 mL IVPB     2,000 mg 250 mL/hr over 120 Minutes Intravenous  Once 03/29/16 1447 03/29/16 1923     Subjective: Pt had a seizure overnight, received PICC line this morning, lab was unable to obtain his morning labs  Objective: Filed Vitals:   03/31/16 0404 03/31/16 0600 03/31/16 0731 03/31/16 0739  BP: 116/74 119/70 118/77   Pulse: 114 113 113   Temp: 98.3 F (36.8 C) 99 F (37.2 C) 98.7 F (37.1 C)   TempSrc: Oral  Axillary   Resp: Height:      Weight:      SpO2: 95% 99% 94% 97%    Intake/Output Summary (Last 24 hours) at 03/31/16 1001 Last data filed at 03/31/16 0746  Gross per  24 hour  Intake   2890 ml  Output   1000 ml  Net   1890 ml   Filed Weights   03/29/16 1357 03/29/16 1434 03/29/16 2122  Weight: 260 lb (117.935 kg) 280 lb (127.007 kg) 284 lb 6.3 oz (129 kg)    Exam:   General exam: Pt appears well nourished lying supine on the gurney in no obvious distress. Pt cooperative for exam.   Head, eyes and ENT: pronounced TBI noted; Oral mucosa dry.   Neck: Supple. No JVD, carotid bruit or thyromegaly.  Lymphatics: No lymphadenopathy.  Respiratory system: bilateral upper airway expiratory wheeze heard.  Cardiovascular system: tachycardic rate.  Gastrointestinal system: Abdomen is soft and nontender, no guarding and Normal bowel sounds heard. No organomegaly or masses appreciated.  Central nervous system: awake and oriented, pronounced left hemiparesis.  Extremities: Peripheral pulses symmetrically felt.   Skin: No rashes or acute findings.  Psychiatry: Pleasant and cooperative  Data Reviewed: Basic Metabolic Panel:  Recent Labs Lab 03/29/16 1415 03/30/16 0949 03/31/16 4098  NA 139 141 138  K 4.4 3.5 3.5  CL 104 111 109  CO2 26 24 23   GLUCOSE 152* 110* 90  BUN 12 14 12   CREATININE 1.32* 1.02 0.98  CALCIUM 10.2 8.4* 8.2*   Liver Function Tests:  Recent Labs Lab 03/29/16 1415 03/30/16 0949  AST 31 27  ALT 25 20  ALKPHOS 116 73  BILITOT 0.5 0.9  PROT 8.2* 6.0*  ALBUMIN 3.6 2.6*   No results for input(s): LIPASE, AMYLASE in the last 168 hours. No results for input(s): AMMONIA in the last 168 hours. CBC:  Recent Labs Lab 03/29/16 1415 03/30/16 0949 03/31/16 0329  WBC 14.9* 10.6* 9.1  NEUTROABS 11.7* 8.5*  --   HGB 13.4 10.9* 10.6*  HCT 41.8 34.5* 33.9*  MCV 93.9 93.5 95.2  PLT 287 232 218   Cardiac Enzymes: No results for input(s): CKTOTAL, CKMB, CKMBINDEX, TROPONINI in the last 168 hours. BNP (last 3 results) No results for input(s): PROBNP in the last 8760 hours. CBG:  Recent Labs Lab 03/30/16 1724  03/30/16 2003  GLUCAP 115* 147*    Recent Results (from the past 240 hour(s))  Culture, blood (Routine x 2)     Status: None (Preliminary result)   Collection Time: 03/29/16  5:55 PM  Result Value Ref Range Status   Specimen Description BLOOD RIGHT HAND  Final   Special Requests IN PEDIATRIC BOTTLE 1CC  Final   Culture NO GROWTH < 24 HOURS  Final   Report Status PENDING  Incomplete  Urine culture     Status: Abnormal   Collection Time: 03/29/16  8:12 PM  Result Value Ref Range Status   Specimen Description URINE, CLEAN CATCH  Final   Special Requests NONE  Final   Culture 3,000 COLONIES/mL INSIGNIFICANT GROWTH (A)  Final   Report Status 03/30/2016 FINAL  Final  MRSA PCR Screening     Status: Abnormal   Collection Time: 03/29/16  9:46 PM  Result Value Ref Range Status   MRSA by PCR POSITIVE (A) NEGATIVE Final    Comment: RESULT CALLED TO, READ BACK BY AND VERIFIED WITH: RN B REAT AT 2300 [id number] MARTINB        The GeneXpert MRSA Assay (FDA approved for NASAL specimens only), is one component of a comprehensive MRSA colonization surveillance program. It is not intended to diagnose MRSA infection nor to guide or monitor treatment for MRSA infections.   Gastrointestinal Panel by PCR , Stool     Status: None   Collection Time: 03/30/16  6:32 AM  Result Value Ref Range Status   Campylobacter species NOT DETECTED NOT DETECTED Final   Plesimonas shigelloides NOT DETECTED NOT DETECTED Final   Salmonella species NOT DETECTED NOT DETECTED Final   Yersinia enterocolitica NOT DETECTED NOT DETECTED Final   Vibrio species NOT DETECTED NOT DETECTED Final   Vibrio cholerae NOT DETECTED NOT DETECTED Final   Enteroaggregative E coli (EAEC) NOT DETECTED NOT DETECTED Final   Enteropathogenic E coli (EPEC) NOT DETECTED NOT DETECTED Final   Enterotoxigenic E coli (ETEC) NOT DETECTED NOT DETECTED Final   Shiga like toxin producing E coli (STEC) NOT DETECTED NOT DETECTED Final   E. coli  O157 NOT DETECTED NOT DETECTED Final   Shigella/Enteroinvasive E coli (EIEC) NOT DETECTED NOT DETECTED Final   Cryptosporidium NOT DETECTED NOT DETECTED Final   Cyclospora cayetanensis NOT DETECTED NOT DETECTED Final   Entamoeba histolytica NOT DETECTED NOT DETECTED Final   Giardia lamblia NOT DETECTED NOT DETECTED  Final   Adenovirus F40/41 NOT DETECTED NOT DETECTED Final   Astrovirus NOT DETECTED NOT DETECTED Final   Norovirus GI/GII NOT DETECTED NOT DETECTED Final   Rotavirus A NOT DETECTED NOT DETECTED Final   Sapovirus (I, II, IV, and V) NOT DETECTED NOT DETECTED Final  C difficile quick scan w PCR reflex     Status: None   Collection Time: 03/30/16  6:32 AM  Result Value Ref Range Status   C Diff antigen NEGATIVE NEGATIVE Final   C Diff toxin NEGATIVE NEGATIVE Final   C Diff interpretation Negative for toxigenic C. difficile  Final     Studies: US Abdomen Complete  03/30/2016  CLINICAL DATA:  Abdominal pain EXAM: ABDOMEN ULTRASOUND COMPLETE COMPARISON:  CT from earlier the same day FINDINGS: Gallbladder: Surgically absent Common bile duct: Diameter: 7 mm. Where visualized, no filling defect. Liver: No focal lesion identified. Within normal limits in parenchymal echogenicity. IVC: No abnormality visualized. Pancreas: Not visualized but without acute finding or mass on previous CT. Spleen: Size and appearance within normal limits. Right Kidney: Length: 10 cm. Cortical thinning with normal echogenicity. No hydronephrosis. Left Kidney: Length: 11 cm. Cortical thinning with normal echogenicity. No hydronephrosis. Abdominal aorta: Obscured at the mid and distal segments. No acute finding on previous CT. No proximal aneurysm. IMPRESSION: 1. No explanation for abdominal pain. 2. Multiple areas are poorly visualized but negative for acute finding on preceding CT. Electronically Signed   By: Marnee Spring M.D.   On: 03/30/2016 04:56   Ct Abdomen Pelvis W Contrast  03/30/2016  CLINICAL DATA:   Abdominal pain.  History of hypertension. EXAM: CT ABDOMEN AND PELVIS WITH CONTRAST TECHNIQUE: Multidetector CT imaging of the abdomen and pelvis was performed using the standard protocol following bolus administration of intravenous contrast. CONTRAST:  1 ISOVUE-300 IOPAMIDOL (ISOVUE-300) INJECTION 61% COMPARISON:  None. FINDINGS: Lower chest and abdominal wall:  Symmetric gynecomastia. Probable previous right inguinal hernia repair. Cardiomegaly and coronary atherosclerosis. Hepatobiliary: No focal liver abnormality.Cholecystectomy with normal common bile duct diameter Pancreas: Unremarkable. Spleen: Unremarkable. Adrenals/Urinary Tract: Mildly lobulated left adrenal gland without discrete nodule. No hydronephrosis or stone. The bladder is decompressed by Foley catheter. Reproductive:Negative Stomach/Bowel: Redundant sigmoid colon with wandering course. The rectum and distal sigmoid has circumferential wall thickening and mild sigmoid mesenteric inflammatory change. The mesenteric vessels are somewhat distorted but there is no volvulus or obstruction. No small bowel dilatation. No appendicitis. No signs of bowel necrosis or perforation. Vascular/Lymphatic: No acute vascular finding. To level permitted by technique, the IMV and IMA are patent. Intravenous gas at the level of the left groin that is likely from vascular access. No mass or adenopathy. Peritoneal: No ascites or pneumoperitoneum. Musculoskeletal: Sclerotic density in the L5 vertebra is likely a bone island. Remote T11, L1, and L4 superior endplate fractures. IMPRESSION: Rectosigmoid thickening could be ischemic or infectious. Elongated and tortuous sigmoid colon without obstruction or volvulus. No other potential source of sepsis is identified. Incidental findings noted above. Electronically Signed   By: Marnee Spring M.D.   On: 03/30/2016 00:50   Dg Chest Port 1 View  03/30/2016  CLINICAL DATA:  Central catheter placement.  Hypertension. EXAM:  PORTABLE CHEST 1 VIEW COMPARISON:  March 29, 2016 FINDINGS: Central catheter tip is in the superior vena cava. No pneumothorax. There is no edema or consolidation. Cardiomegaly is stable. Pulmonary vascularity is normal. Prominence of the thoracic aorta is stable. No adenopathy. No bone lesions evident. IMPRESSION: Central catheter tip in superior vena cava.  No pneumothorax. Stable cardiomegaly. No edema or consolidation. Prominence of the thoracic aorta is stable and likely due to chronic hypertensive change. Electronically Signed   By: Bretta Bang III M.D.   On: 03/30/2016 10:51   Dg Chest Port 1 View  03/29/2016  CLINICAL DATA:  Sepsis hypotension EXAM: PORTABLE CHEST 1 VIEW COMPARISON:  None FINDINGS: Severe cardiac silhouette enlargement with azygos vein distension and mild pulmonary vascular congestion. No consolidation or edema appreciated. IMPRESSION: Cardiac enlargement with vascular congestion but no evidence of edema or consolidation. Electronically Signed   By: Esperanza Heir M.D.   On: 03/29/2016 15:25     Scheduled Meds: . acidophilus  1 capsule Oral Daily  . antiseptic oral rinse  7 mL Mouth Rinse BID  . atorvastatin  20 mg Oral Daily  . bethanechol  25 mg Oral TID  . Chlorhexidine Gluconate Cloth  6 each Topical Q0600  . cloBAZam  40 mg Oral QPM  . clopidogrel  75 mg Oral Daily  . digoxin  0.25 mg Oral Daily  . enoxaparin (LOVENOX) injection  40 mg Subcutaneous Q24H  . finasteride  5 mg Oral Daily  . ipratropium-albuterol  3 mL Nebulization TID  . lamoTRIgine  400 mg Oral BID  . levETIRAcetam  1,000 mg Oral QPM  . levETIRAcetam  1,250 mg Oral q morning - 10a  . metoprolol tartrate  25 mg Oral TID  . montelukast  10 mg Oral QHS  . mupirocin ointment  1 application Nasal BID  . piperacillin-tazobactam (ZOSYN)  IV  3.375 g Intravenous Q8H  . sodium chloride flush  10-40 mL Intracatheter Q12H  . tamsulosin  0.4 mg Oral QPC supper  . vancomycin  1,000 mg Intravenous Q12H    Continuous Infusions: . sodium chloride 75 mL/hr at 03/30/16 1259    Principal Problem:   Severe sepsis (HCC) Active Problems:   Sepsis (HCC)   Hypothermia   Epilepsy (HCC)   Abdominal pain, generalized   Hypotension, unspecified   DNR (do not resuscitate)   Leukocytosis   TBI (traumatic brain injury) (HCC)   Dysphagia   Coronary artery disease   Diarrhea   History of traumatic brain injury   Chronic constipation   Altered mental state   At high risk for aspiration   CHF (congestive heart failure) (HCC)   Left hemiparesis [facility])  Critical Care Time spent: 25 mins  Standley Dakins, MD, FAAFP [facility] Pager [phone]  If 7PM-7AM, please contact night-coverage [email or web address] Password [email or web address] 03/31/2016, 10:01 AM    LOS: 2 days

## 2016-03-31 NOTE — Consult Note (Signed)
Pharmacy Antibiotic Note  Clancy GourdMichael Double is a 65 y.o. male [facility] 03[facility]29/[facility] with AMS, diaphoresis and hypotension. Code sepsis called. Pharmacy consulted for vancomycin and zosyn. SCr stable 0.98, normalized CrCl~. AF, wbc down to wnl.  VT slightly low (14) this afternoon, drawn 30 minutes early. Dose already given at 1600 today.  Plan: Increase vancomycin slightly to 1250mg  IV q12h - start with next dose Zosyn 3.375g IV q8 (4 hour infusion) Follow renal function, cultures, LOT, VT at new Css  Height: 6\' 4"  (193 cm) Weight: 284 lb 6.3 oz (129 kg) IBW/kg (Calculated) : 86.8  Temp (24hrs), Avg:98.7 F (37.1 C), Min:97.8 F (36.6 C), Max:99.1 F (37.3 C)   Recent Labs Lab 03/29/16 1415 03/29/16 1811 03/30/16 0949 03/30/16 0950 03/31/16 0329 03/31/16 1500  WBC 14.9*  --  10.6*  --  9.1  --   CREATININE 1.32*  --  1.02  --  0.98  --   LATICACIDVEN  --  3.67*  --  0.9  --   --   VANCOTROUGH  --   --   --   --   --  14    Estimated Creatinine Clearance: 113.2 mL/min (by C-G formula based on Cr of 0.98).    Allergies  Allergen Reactions  . Acetaminophen   . Azithromycin   . Biaxin [Clarithromycin]   . Diltiazem   . Verapamil     Antimicrobials this admission: 6/8 Vancomycin >> 6/8 Zosyn >>  Dose adjustments this admission: 6/10 VT: 14 on 1g IV q12h, drawn 30 min early  Microbiology results: 6/8 Vanc >> 6/8 Zosyn >>  6/8 UCx>> insignificant growth  6/8 BCx2>>ngtd  6/9 Cdiff>> neg    Babs BertinHaley Carlynn Leduc, PharmD, [facility] TucsonBCPS Clinical Pharmacist Pager [phone] 03/31/2016 4:24 PM

## 2016-03-31 NOTE — Progress Notes (Signed)
03/31/2016 2:16 PM  Pt transferred from [facility] to 6E18 via bed accompanied by his sister, Joyce GrossKay.  [facility].  Pt fully alert and oriented, denies pain. Vitals stable, full assessment to EPIC.  Pt placed on telemetry box #19, verified with CCMD and Phillips GroutAshley Tucker, NT.  Skin assessment reveals heels reddened bilaterally but blancheable--offloaded with pillows.  Fissure noted 1.5*0.1*0.1 to mid sacrum, OTA, 100% red wound bed, no drainage.  Sacrum/buttocks reddened but blancheable.  Bruising noted to arms bilaterally as well as to left lower quadrant/left hip.  Lunch tray ordered for patient.  PICC to RUA appears WNL.  Pt and sister oriented to room/unit, and were instructed on how to utilize the call bell, to which they verbalized understanding.  Bed placed in lowest position, bed alarm turned on.  Will continue to monitor patient. Theadora RamaKIRKMAN, Anistyn Graddy Brooke

## 2016-03-31 NOTE — Progress Notes (Signed)
SLP Cancellation Note  Patient Details Name: Austin GourdMichael Walle MRN: [id number] DOB: 18-Dec-1950   Cancelled treatment:       Reason Eval/Treat Not Completed: Schedule conflict with SLP; swallow re-assessment; on caseload   Shawnte Demarest,PAT 03/31/2016, 2:23 PM

## 2016-03-31 NOTE — Progress Notes (Signed)
Subjective: No abd pain, somnolent, eating, having bms  Objective: Vital signs in last 24 hours: Temp:  [98.1 F (36.7 C)-99.7 F (37.6 C)] 98.7 F (37.1 C) (06/10 0731) Pulse Rate:  [111-115] 113 (06/10 0731) Resp:  [18-23] 18 (06/10 0731) BP: (112-131)/(65-89) 118/77 mmHg (06/10 0731) SpO2:  [94 %-100 %] 97 % (06/10 0739) Last BM Date: 03/30/16  Intake/Output from previous day: 06/09 0701 - 06/10 0700 In: 2990 [P.O.:640; I.V.:1800; IV Piggyback:550] Out: 900 [Urine:900] Intake/Output this shift: Total I/O In: -  Out: 100 [Urine:100]  GI: soft nt/nd  Lab Results:   Recent Labs  03/30/16 0949 03/31/16 0329  WBC 10.6* 9.1  HGB 10.9* 10.6*  HCT 34.5* 33.9*  PLT 232 218   BMET  Recent Labs  03/30/16 0949 03/31/16 0329  NA 141 138  K 3.5 3.5  CL 111 109  CO2 24 23  GLUCOSE 110* 90  BUN 14 12  CREATININE 1.02 0.98  CALCIUM 8.4* 8.2*   PT/INR  Recent Labs  03/30/16 0949  LABPROT 16.2*  INR 1.29   ABG No results for input(s): PHART, HCO3 in the last 72 hours.  Invalid input(s): PCO2, PO2  Studies/Results: [place] Abdomen Complete  03/30/2016  CLINICAL DATA:  Abdominal pain EXAM: ABDOMEN ULTRASOUND COMPLETE COMPARISON:  CT from earlier the same day FINDINGS: Gallbladder: Surgically absent Common bile duct: Diameter: 7 mm. Where visualized, no filling defect. Liver: No focal lesion identified. Within normal limits in parenchymal echogenicity. IVC: No abnormality visualized. Pancreas: Not visualized but without acute finding or mass on previous CT. Spleen: Size and appearance within normal limits. Right Kidney: Length: 10 cm. Cortical thinning with normal echogenicity. No hydronephrosis. Left Kidney: Length: 11 cm. Cortical thinning with normal echogenicity. No hydronephrosis. Abdominal aorta: Obscured at the mid and distal segments. No acute finding on previous CT. No proximal aneurysm. IMPRESSION: 1. No explanation for abdominal pain. 2. Multiple areas are  poorly visualized but negative for acute finding on preceding CT. Electronically Signed   By: Marnee SpringJonathon  Watts M.D.   On: 03/30/2016 04:56   Ct Abdomen Pelvis W Contrast  03/30/2016  CLINICAL DATA:  Abdominal pain.  History of hypertension. EXAM: CT ABDOMEN AND PELVIS WITH CONTRAST TECHNIQUE: Multidetector CT imaging of the abdomen and pelvis was performed using the standard protocol following bolus administration of intravenous contrast. CONTRAST:  1 ISOVUE-300 IOPAMIDOL (ISOVUE-300) INJECTION 61% COMPARISON:  None. FINDINGS: Lower chest and abdominal wall:  Symmetric gynecomastia. Probable previous right inguinal hernia repair. Cardiomegaly and coronary atherosclerosis. Hepatobiliary: No focal liver abnormality.Cholecystectomy with normal common bile duct diameter Pancreas: Unremarkable. Spleen: Unremarkable. Adrenals/Urinary Tract: Mildly lobulated left adrenal gland without discrete nodule. No hydronephrosis or stone. The bladder is decompressed by Foley catheter. Reproductive:Negative Stomach/Bowel: Redundant sigmoid colon with wandering course. The rectum and distal sigmoid has circumferential wall thickening and mild sigmoid mesenteric inflammatory change. The mesenteric vessels are somewhat distorted but there is no volvulus or obstruction. No small bowel dilatation. No appendicitis. No signs of bowel necrosis or perforation. Vascular/Lymphatic: No acute vascular finding. To level permitted by technique, the IMV and IMA are patent. Intravenous gas at the level of the left groin that is likely from vascular access. No mass or adenopathy. Peritoneal: No ascites or pneumoperitoneum. Musculoskeletal: Sclerotic density in the L5 vertebra is likely a bone island. Remote T11, L1, and L4 superior endplate fractures. IMPRESSION: Rectosigmoid thickening could be ischemic or infectious. Elongated and tortuous sigmoid colon without obstruction or volvulus. No other potential source of sepsis is  identified. Incidental  findings noted above. Electronically Signed   By: Marnee Spring M.D.   On: 03/30/2016 00:50   Dg Chest Port 1 View  03/30/2016  CLINICAL DATA:  Central catheter placement.  Hypertension. EXAM: PORTABLE CHEST 1 VIEW COMPARISON:  March 29, 2016 FINDINGS: Central catheter tip is in the superior vena cava. No pneumothorax. There is no edema or consolidation. Cardiomegaly is stable. Pulmonary vascularity is normal. Prominence of the thoracic aorta is stable. No adenopathy. No bone lesions evident. IMPRESSION: Central catheter tip in superior vena cava. No pneumothorax. Stable cardiomegaly. No edema or consolidation. Prominence of the thoracic aorta is stable and likely due to chronic hypertensive change. Electronically Signed   By: Bretta Bang III M.D.   On: 03/30/2016 10:51   Dg Chest Port 1 View  03/29/2016  CLINICAL DATA:  Sepsis hypotension EXAM: PORTABLE CHEST 1 VIEW COMPARISON:  None FINDINGS: Severe cardiac silhouette enlargement with azygos vein distension and mild pulmonary vascular congestion. No consolidation or edema appreciated. IMPRESSION: Cardiac enlargement with vascular congestion but no evidence of edema or consolidation. Electronically Signed   By: Esperanza Heir M.D.   On: 03/29/2016 15:25    Anti-infectives: Anti-infectives    Start     Dose/Rate Route Frequency Ordered Stop   03/30/16 0330  vancomycin (VANCOCIN) IVPB 1000 mg/200 mL premix     1,000 mg 200 mL/hr over 60 Minutes Intravenous Every 12 hours 03/29/16 1506     03/29/16 2300  piperacillin-tazobactam (ZOSYN) IVPB 3.375 g     3.375 g 12.5 mL/hr over 240 Minutes Intravenous Every 8 hours 03/29/16 1506     03/29/16 1730  metroNIDAZOLE (FLAGYL) IVPB 500 mg  Status:  Discontinued     500 mg 100 mL/hr over 60 Minutes Intravenous Every 8 hours 03/29/16 1720 03/30/16 1018   03/29/16 1500  piperacillin-tazobactam (ZOSYN) IVPB 3.375 g     3.375 g 100 mL/hr over 30 Minutes Intravenous  Once 03/29/16 1446 03/29/16 1547    03/29/16 1500  vancomycin (VANCOCIN) IVPB 1000 mg/200 mL premix  Status:  Discontinued     1,000 mg 200 mL/hr over 60 Minutes Intravenous  Once 03/29/16 1446 03/29/16 1447   03/29/16 1500  vancomycin (VANCOCIN) 2,000 mg in sodium chloride 0.9 % 500 mL IVPB     2,000 mg 250 mL/hr over 120 Minutes Intravenous  Once 03/29/16 1447 03/29/16 1923      Assessment/Plan: Abdominal pain, rectosigmoid thickening on ct scan  Clinically this has resolved with exam, likely infectious possibly ischemia but no indication for surgical intervention.he is not much of a historian. Should be seen for possible endoscopy after this episode also. Would continue medical mgt and call us back if necessary.   [facility] 03/31/2016

## 2016-03-31 NOTE — Progress Notes (Signed)
03/31/2016 Patient foley cath was removed art 1249. He had 350cc amber urine. [facility] Ferrante RN.

## 2016-04-01 MED ORDER — ENOXAPARIN SODIUM 60 MG/0.6ML ~~LOC~~ SOLN
60.0000 mg | SUBCUTANEOUS | Status: DC
  Administered 2016-04-01 – 2016-04-02 (×2): 60 mg via SUBCUTANEOUS
  Filled 2016-04-01 (×2): qty 0.6

## 2016-04-01 MED ORDER — IPRATROPIUM-ALBUTEROL 0.5-2.5 (3) MG/3ML IN SOLN
3.0000 mL | Freq: Two times a day (BID) | RESPIRATORY_TRACT | Status: DC
  Administered 2016-04-02 – 2016-04-03 (×2): 3 mL via RESPIRATORY_TRACT
  Filled 2016-04-01 (×3): qty 3

## 2016-04-01 MED ORDER — ALUM & MAG HYDROXIDE-SIMETH 200-200-20 MG/5ML PO SUSP
30.0000 mL | ORAL | Status: DC | PRN
  Administered 2016-04-01: 30 mL via ORAL
  Filled 2016-04-01: qty 30

## 2016-04-01 MED ORDER — PANTOPRAZOLE SODIUM 40 MG PO TBEC
40.0000 mg | DELAYED_RELEASE_TABLET | Freq: Every day | ORAL | Status: DC
  Administered 2016-04-01 – 2016-04-03 (×3): 40 mg via ORAL
  Filled 2016-04-01 (×3): qty 1

## 2016-04-01 NOTE — Progress Notes (Signed)
04/01/2016 2:52 PM  Came to reassess patient.  He had some indigestion with eating and having some symptoms of gas and acid reflux.  Will start protonix daily and maalox as needed. Abdominal exam is benign.    Maryln Manuel. Wynston Romey, MD

## 2016-04-01 NOTE — Progress Notes (Signed)
PROGRESS NOTE    Austin Hartman  UJW:[id number]  DOB: 04/18/1951  DOA: 03/29/2016 PCP: No primary care provider on file. Outpatient Specialists:  Hospital course: Austin Hartman is a 65 y.o. male with a significant history of traumatic brain injury and [facility]. 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 He has had multiple recent admissions for sepsis syndrome. He did have sepsis admission approximately one week ago [facility]. Approximate 3 weeks prior to that he also was admitted there for sepsis. His today family members are with him and they are very supportive and involved. They decided to bring him to [facility] for this admission. He had been doing well since he was discharged from [facility] until today. They sat down to noon meal and he suddenly became pale and diaphoretic and confused. His blood pressure dropped and he remained confused and was found to be hypotensive and hypothermic. Upon medic arrival the patient was cool and diaphoretic. They established an IO in the left shoulder to initiate fluid resuscitation. Patient had difficult IV access. Patient was complaining of generalized pain and was confused and altered mentally. He was noted to have an elevated lactate of >3. He had complained of some abdominal pain. He has long history of chronic constipation but has had bowel movements recently per family. He was noted today to have copious watery stool output that eventually subsided. His family says that he has done this before with sepsis. Pt is being admitted for stepdown ICU care.   Assessment & Plan:  Severe Sepsis resolved - secondary to GI infection. Clinically improved.  following blood cultures, neg to date,   clinically patient has been improving. Possible GI source of infection given CT findings.  I asked for general surgery to evaluate. Broad spectrum IV antibiotics will be continued for now.  Transfer to telemetry.   Altered Mental State - improved  with intensive resuscitation. This was likely a result of the hypotension, hypothermia and sepsis. Neuro checks ordered.   Hypothermia - resolved now.  He did not require warmed IV fluids. This was likely from bacteremia/sepsis. Foley discontinued.   Hypotension - blood pressures stabilized after aggressive IVF hydration for sepsis protocol. Will monitor closely. No need for pressors at this time. Family was asked and they are fine with short term use of pressors if needed.   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 He is being placed on seizure precautions and will prescribe valium to control seizures. After many years of experience caring for patient, family is adamant that valium has been the most effective treatment for controlling the patient's seizures.   Abdominal Pain resolved now - CT abdomen: ischemia versus infection. Pt does have history of chronic constipation but apparently has been having bowel movement in last few days per family. C. Diff negative.   Chronic Constipation - Discontinued laxatives at this time because of copious diarrhea on admission but will resume when appropriate.   Diarrhea - C diff negative.  DC rectal tube 6/11.   Dysphagia - Pt has been managing with elevating the head of bed with eating and drinking. Would observe aspiration precautions. Elevate HOB 90 deg when eating.  Consult SLP.   DNR - Had a long discussion with family. Pt has been progressively declining over recent months and they have elected to make him DNR. They would like to continue full medical care at this time.   Multiple  recent hospitalizations: I placed order to request medical records from [facility] for recent admissions.   CAD - normal troponin, no symptoms of chest pain, no acute EKG changes, resuming home plavix for now.   Acute Renal Insufficiency - Normalized after hydration.    Hyperglycemia - likely stress reaction, no known history of diabetes mellitus. BS better now. Checking A1c.  DVT Prophylaxis: lovenox Code Status: DNR Family Communication: spoke at length sister at bedside  Disposition Plan: anticipate return to [facility] 1-2 days Consultants:  General Surgery  Procedures:  Picc Line Placement 03/30/16  Antimicrobials: Anti-infectives    Start     Dose/Rate Route Frequency Ordered Stop   04/01/16 0330  vancomycin (VANCOCIN) 1,250 mg in sodium chloride 0.9 % 250 mL IVPB     1,250 mg 166.7 mL/hr over 90 Minutes Intravenous Every 12 hours 03/31/16 1625     03/30/16 0330  vancomycin (VANCOCIN) IVPB 1000 mg/200 mL premix  Status:  Discontinued     1,000 mg 200 mL/hr over 60 Minutes Intravenous Every 12 hours 03/29/16 1506 03/31/16 1625   03/29/16 2300  piperacillin-tazobactam (ZOSYN) IVPB 3.375 g     3.375 g 12.5 mL/hr over 240 Minutes Intravenous Every 8 hours 03/29/16 1506     03/29/16 1730  metroNIDAZOLE (FLAGYL) IVPB 500 mg  Status:  Discontinued     500 mg 100 mL/hr over 60 Minutes Intravenous Every 8 hours 03/29/16 1720 03/30/16 1018   03/29/16 1500  piperacillin-tazobactam (ZOSYN) IVPB 3.375 g     3.375 g 100 mL/hr over 30 Minutes Intravenous  Once 03/29/16 1446 03/29/16 1547   03/29/16 1500  vancomycin (VANCOCIN) IVPB 1000 mg/200 mL premix  Status:  Discontinued     1,000 mg 200 mL/hr over 60 Minutes Intravenous  Once 03/29/16 1446 03/29/16 1447   03/29/16 1500  vancomycin (VANCOCIN) 2,000 mg in sodium chloride 0.9 % 500 mL IVPB     2,000 mg 250 mL/hr over 120 Minutes Intravenous  Once 03/29/16 1447 03/29/16 1923     Subjective: Pt reports feeling much better today.  Foley was discontinued on 6/10.  Urinating.    Objective: Filed Vitals:   03/31/16 1842 03/31/16 2033 04/01/16 0553 04/01/16 0827  BP: 126/67 116/75 117/76 127/83  Pulse: 100 109 110 109  Temp: 98.5 F (36.9 C) 98 F (36.7 C) 98.5 F (36.9 C) 98.6 F (37  C)  TempSrc: Oral Axillary Oral Oral  Resp: 18 18 17 18   Height:      Weight:      SpO2: 98% 99% 97% 98%    Intake/Output Summary (Last 24 hours) at 04/01/16 1026 Last data filed at 04/01/16 0821  Gross per 24 hour  Intake 2242.83 ml  Output   1600 ml  Net 642.83 ml   Filed Weights   03/29/16 1357 03/29/16 1434 03/29/16 2122  Weight: 260 lb (117.935 kg) 280 lb (127.007 kg) 284 lb 6.3 oz (129 kg)    Exam:   General exam: Pt appears well nourished lying supine on the gurney in no obvious distress. Pt cooperative for exam.   Head, eyes and ENT: pronounced TBI noted; Oral mucosa dry.   Neck: Supple. No JVD, carotid bruit or thyromegaly.  Lymphatics: No lymphadenopathy.  Respiratory system: bilateral upper airway expiratory wheeze heard.  Cardiovascular system: tachycardic rate.  Gastrointestinal system: Abdomen is soft and nontender, no guarding and Normal bowel sounds heard. No organomegaly or masses appreciated.  Central nervous system: awake and oriented, pronounced left hemiparesis.  Extremities: Peripheral pulses symmetrically felt.   Skin: No rashes or acute findings.  Psychiatry: Pleasant and cooperative  Data Reviewed: Basic Metabolic Panel:  Recent Labs Lab 03/29/16 1415 03/30/16 0949 03/31/16 0329  NA 139 141 138  K 4.4 3.5 3.5  CL 104 111 109  CO2 26 24 23   GLUCOSE 152* 110* 90  BUN 12 14 12   CREATININE 1.32* 1.02 0.98  CALCIUM 10.2 8.4* 8.2*   Liver Function Tests:  Recent Labs Lab 03/29/16 1415 03/30/16 0949  AST 31 27  ALT 25 20  ALKPHOS 116 73  BILITOT 0.5 0.9  PROT 8.2* 6.0*  ALBUMIN 3.6 2.6*   No results for input(s): LIPASE, AMYLASE in the last 168 hours. No results for input(s): AMMONIA in the last 168 hours. CBC:  Recent Labs Lab 03/29/16 1415 03/30/16 0949 03/31/16 0329  WBC 14.9* 10.6* 9.1  NEUTROABS 11.7* 8.5*  --   HGB 13.4 10.9* 10.6*  HCT 41.8 34.5* 33.9*  MCV 93.9 93.5 95.2  PLT 287 232 218    Cardiac Enzymes: No results for input(s): CKTOTAL, CKMB, CKMBINDEX, TROPONINI in the last 168 hours. BNP (last 3 results) No results for input(s): PROBNP in the last 8760 hours. CBG:  Recent Labs Lab 03/30/16 1724 03/30/16 2003  GLUCAP 115* 147*    Recent Results (from the past 240 hour(s))  Culture, blood (Routine x 2)     Status: None (Preliminary result)   Collection Time: 03/29/16  5:55 PM  Result Value Ref Range Status   Specimen Description BLOOD RIGHT HAND  Final   Special Requests IN PEDIATRIC BOTTLE 1CC  Final   Culture NO GROWTH 2 DAYS  Final   Report Status PENDING  Incomplete  Urine culture     Status: Abnormal   Collection Time: 03/29/16  8:12 PM  Result Value Ref Range Status   Specimen Description URINE, CLEAN CATCH  Final   Special Requests NONE  Final   Culture 3,000 COLONIES/mL INSIGNIFICANT GROWTH (A)  Final   Report Status 03/30/2016 FINAL  Final  MRSA PCR Screening     Status: Abnormal   Collection Time: 03/29/16  9:46 PM  Result Value Ref Range Status   MRSA by PCR POSITIVE (A) NEGATIVE Final    Comment: RESULT CALLED TO, READ BACK BY AND VERIFIED WITH: RN B REAT AT 2300 [id number] MARTINB        The GeneXpert MRSA Assay (FDA approved for NASAL specimens only), is one component of a comprehensive MRSA colonization surveillance program. It is not intended to diagnose MRSA infection nor to guide or monitor treatment for MRSA infections.   Gastrointestinal Panel by PCR , Stool     Status: None   Collection Time: 03/30/16  6:32 AM  Result Value Ref Range Status   Campylobacter species NOT DETECTED NOT DETECTED Final   Plesimonas shigelloides NOT DETECTED NOT DETECTED Final   Salmonella species NOT DETECTED NOT DETECTED Final   Yersinia enterocolitica NOT DETECTED NOT DETECTED Final   Vibrio species NOT DETECTED NOT DETECTED Final   Vibrio cholerae NOT DETECTED NOT DETECTED Final   Enteroaggregative E coli (EAEC) NOT DETECTED NOT DETECTED Final    Enteropathogenic E coli (EPEC) NOT DETECTED NOT DETECTED Final   Enterotoxigenic E coli (ETEC) NOT DETECTED NOT DETECTED Final   Shiga like toxin producing E coli (STEC) NOT DETECTED NOT DETECTED Final   E. coli O157 NOT DETECTED NOT DETECTED Final   Shigella/Enteroinvasive E coli (EIEC) NOT DETECTED NOT DETECTED Final  Cryptosporidium NOT DETECTED NOT DETECTED Final   Cyclospora cayetanensis NOT DETECTED NOT DETECTED Final   Entamoeba histolytica NOT DETECTED NOT DETECTED Final   Giardia lamblia NOT DETECTED NOT DETECTED Final   Adenovirus F40/41 NOT DETECTED NOT DETECTED Final   Astrovirus NOT DETECTED NOT DETECTED Final   Norovirus GI/GII NOT DETECTED NOT DETECTED Final   Rotavirus A NOT DETECTED NOT DETECTED Final   Sapovirus (I, II, IV, and V) NOT DETECTED NOT DETECTED Final  C difficile quick scan w PCR reflex     Status: None   Collection Time: 03/30/16  6:32 AM  Result Value Ref Range Status   C Diff antigen NEGATIVE NEGATIVE Final   C Diff toxin NEGATIVE NEGATIVE Final   C Diff interpretation Negative for toxigenic C. difficile  Final     Studies: Dg Chest Port 1 View  03/30/2016  CLINICAL DATA:  Central catheter placement.  Hypertension. EXAM: PORTABLE CHEST 1 VIEW COMPARISON:  March 29, 2016 FINDINGS: Central catheter tip is in the superior vena cava. No pneumothorax. There is no edema or consolidation. Cardiomegaly is stable. Pulmonary vascularity is normal. Prominence of the thoracic aorta is stable. No adenopathy. No bone lesions evident. IMPRESSION: Central catheter tip in superior vena cava. No pneumothorax. Stable cardiomegaly. No edema or consolidation. Prominence of the thoracic aorta is stable and likely due to chronic hypertensive change. Electronically Signed   By: Bretta Bang III M.D.   On: 03/30/2016 10:51     Scheduled Meds: . acidophilus  1 capsule Oral Daily  . antiseptic oral rinse  7 mL Mouth Rinse BID  . atorvastatin  20 mg Oral Daily  .  bethanechol  25 mg Oral TID  . Chlorhexidine Gluconate Cloth  6 each Topical Q0600  . cloBAZam  40 mg Oral QPM  . clopidogrel  75 mg Oral Daily  . digoxin  0.25 mg Oral Daily  . enoxaparin (LOVENOX) injection  40 mg Subcutaneous Q24H  . finasteride  5 mg Oral Daily  . ipratropium-albuterol  3 mL Nebulization TID  . lamoTRIgine  400 mg Oral BID  . levETIRAcetam  1,000 mg Oral QPM  . levETIRAcetam  1,250 mg Oral q morning - 10a  . metoprolol tartrate  25 mg Oral TID  . montelukast  10 mg Oral QHS  . mupirocin ointment  1 application Nasal BID  . piperacillin-tazobactam (ZOSYN)  IV  3.375 g Intravenous Q8H  . sodium chloride flush  10-40 mL Intracatheter Q12H  . tamsulosin  0.4 mg Oral QPC supper  . vancomycin  1,250 mg Intravenous Q12H   Continuous Infusions: . sodium chloride 50 mL/hr at 03/31/16 1435    Principal Problem:   Severe sepsis (HCC) Active Problems:   Sepsis (HCC)   Hypothermia   Epilepsy (HCC)   Abdominal pain, generalized   Hypotension, unspecified   DNR (do not resuscitate)   Leukocytosis   TBI (traumatic brain injury) (HCC)   Dysphagia   Coronary artery disease   Diarrhea   History of traumatic brain injury   Chronic constipation   Altered mental state   At high risk for aspiration   CHF (congestive heart failure) (HCC)   Left hemiparesis [facility])  Critical Care Time spent: 25 mins  Standley Dakins, MD, FAAFP [facility] Pager [phone]  If 7PM-7AM, please contact night-coverage [email or web address] Password [email or web address] 04/01/2016, 10:26 AM    LOS: 3 days

## 2016-04-01 NOTE — Progress Notes (Signed)
Patient complain of left quadrant abdominal pain with nausea & vomiting.  Sister Joyce GrossKay is requesting for MD to come and see patient.  MD notified.

## 2016-04-02 LAB — BASIC METABOLIC PANEL
ANION GAP: 5 (ref 5–15)
BUN: 5 mg/dL — ABNORMAL LOW (ref 6–20)
CALCIUM: 8.4 mg/dL — AB (ref 8.9–10.3)
CHLORIDE: 108 mmol/L (ref 101–111)
CO2: 27 mmol/L (ref 22–32)
CREATININE: 0.84 mg/dL (ref 0.61–1.24)
GFR calc non Af Amer: >60 mL/min (ref >60)
Glucose, Bld: 79 mg/dL (ref 65–99)
Potassium: 3.4 mmol/L — ABNORMAL LOW (ref 3.5–5.1)
SODIUM: 140 mmol/L (ref 135–145)

## 2016-04-02 LAB — HEMOGLOBIN A1C
HEMOGLOBIN A1C: 5.1 % (ref 4.8–5.6)
MEAN PLASMA GLUCOSE: 100 mg/dL

## 2016-04-02 LAB — GLUCOSE, CAPILLARY
GLUCOSE-CAPILLARY: 94 mg/dL (ref 65–99)
Glucose-Capillary: 104 mg/dL — ABNORMAL HIGH (ref 65–99)
Glucose-Capillary: 113 mg/dL — ABNORMAL HIGH (ref 65–99)

## 2016-04-02 MED ORDER — POTASSIUM CHLORIDE CRYS ER 20 MEQ PO TBCR
40.0000 meq | EXTENDED_RELEASE_TABLET | Freq: Once | ORAL | Status: AC
  Administered 2016-04-02: 40 meq via ORAL
  Filled 2016-04-02: qty 2

## 2016-04-02 MED ORDER — DOCUSATE SODIUM 100 MG PO CAPS
100.0000 mg | ORAL_CAPSULE | Freq: Two times a day (BID) | ORAL | Status: DC
Start: 2016-04-02 — End: 2016-04-03
  Administered 2016-04-02 – 2016-04-03 (×3): 100 mg via ORAL
  Filled 2016-04-02 (×3): qty 1

## 2016-04-02 NOTE — Care Management Important Message (Signed)
Important Message  Patient Details  Name: Austin Hartman MRN: [id number] Date of Birth: 10-11-1951   Medicare Important Message Given:  Yes    Adric Wrede, Annamarie MajorCheryl U, RN 04/02/2016, 12:58 PM

## 2016-04-02 NOTE — [place] FL2 (Signed)
MEDICAID FL2 LEVEL OF CARE SCREENING TOOL     IDENTIFICATION  Patient Name: Austin Hartman Birthdate: 28-May-1951 Sex: male Admission Date (Current Location): 03/29/2016  [place] and [place] Number:  Haynes Bast [id number] Facility and Address:  [facility], [address], [place], [place] [address]      Provider Number: [id number]  Attending Physician Name and Address:  Cleora Fleet, MD  Relative Name and Phone Number:  Garrie Woodin - sister. Phone #[phone]    Current Level of Care: Hospital Recommended Level of Care: Skilled Nursing Facility (Patient from [facility]) Prior Approval Number:    Date Approved/Denied:   PASRR Number: [id number] (Eff. 12/26/15)  Discharge Plan: SNF    Current Diagnoses: Patient Active Problem List   Diagnosis Date Noted  . Chronic constipation 03/29/2016  . Altered mental state 03/29/2016  . Hypothermia 03/29/2016  . Epilepsy (HCC) 03/29/2016  . Abdominal pain, generalized 03/29/2016  . Hypotension, unspecified 03/29/2016  . Left hemiparesis (HCC) 03/29/2016  . At high risk for aspiration 03/29/2016  . DNR (do not resuscitate) 03/29/2016  . Leukocytosis 03/29/2016  . Severe sepsis (HCC) 03/29/2016  . TBI (traumatic brain injury) (HCC)   . Dysphagia   . CHF (congestive heart failure) (HCC)   . Hypertension   . Coronary artery disease   . Diarrhea   . Essential hypertension   . History of traumatic brain injury   . Sepsis (HCC)     Orientation RESPIRATION BLADDER Height & Weight     Self, Time, Situation, Place  Normal Continent Weight: (!) 301 lb 11.2 oz (136.85 kg) Height:  6\' 4"  (193 cm)  BEHAVIORAL SYMPTOMS/MOOD NEUROLOGICAL BOWEL NUTRITION STATUS      Continent Diet (DYS 2)  AMBULATORY STATUS COMMUNICATION OF NEEDS Skin   Total Care (Patient uses wheelchair) Verbally Other (Comment) (Ecchymosis to abdomen and arm; fissue to sacrum)                        Personal Care Assistance Level of Assistance  Bathing, Feeding, Dressing Bathing Assistance: Maximum assistance Feeding assistance: Independent Dressing Assistance: Maximum assistance     Functional Limitations Info  Sight, Hearing, Speech Sight Info: Adequate Hearing Info: Adequate Speech Info: Impaired (Speech slurred)    SPECIAL CARE FACTORS FREQUENCY                       Contractures Contractures Info: Not present    Additional Factors Info  Allergies, Code Status Code Status Info: DNR Allergies Info: Acetaminophen, Azithromycin, Biaxin, Diltiazem, Verapamil           Current Medications (04/02/2016):  This is the current hospital active medication list Current Facility-Administered Medications  Medication Dose Route Frequency Provider Last Rate Last Dose  . 0.9 %  sodium chloride infusion   Intravenous Continuous Clanford Cyndie Mull, MD 10 mL/hr at 04/01/16 1032    . acidophilus (RISAQUAD) capsule 1 capsule  1 capsule Oral Daily Clanford Cyndie Mull, MD   1 capsule at 04/02/16 1158  . alum & mag hydroxide-simeth (MAALOX/MYLANTA) 200-200-20 MG/5ML suspension 30 mL  30 mL Oral Q4H PRN Clanford Cyndie Mull, MD   30 mL at 04/01/16 1553  . antiseptic oral rinse (CPC / CETYLPYRIDINIUM CHLORIDE 0.05%) solution 7 mL  7 mL Mouth Rinse BID Vernona Rieger A Harduk, PA-C   7 mL at 04/02/16 1158  . atorvastatin (LIPITOR) tablet 20 mg  20 mg Oral  Daily Clanford Cyndie Mull, MD   20 mg at 04/02/16 1157  . bethanechol (URECHOLINE) tablet 25 mg  25 mg Oral TID Clanford Cyndie Mull, MD   25 mg at 04/02/16 1543  . Chlorhexidine Gluconate Cloth 2 % PADS 6 each  6 each Topical Q0600 Mallie Darting, PA-C   6 each at 04/02/16 0542  . cloBAZam (ONFI) tablet 40 mg  40 mg Oral QPM Clanford Cyndie Mull, MD   40 mg at 04/01/16 1735  . clopidogrel (PLAVIX) tablet 75 mg  75 mg Oral Daily Clanford Cyndie Mull, MD   75 mg at 04/02/16 1155  . cyclobenzaprine (FLEXERIL) tablet 5 mg  5 mg Oral Q12H PRN Clanford Cyndie Mull, MD      . diazepam (VALIUM) injection 10 mg  10 mg Intravenous Q4H PRN Gerome Apley Harduk, PA-C      . digoxin (LANOXIN) tablet 0.25 mg  0.25 mg Oral Daily Clanford L Johnson, MD   0.25 mg at 04/02/16 1155  . docusate sodium (COLACE) capsule 100 mg  100 mg Oral BID Clanford Cyndie Mull, MD   100 mg at 04/02/16 1158  . enoxaparin (LOVENOX) injection 60 mg  60 mg Subcutaneous Q24H Belinda Fisher Stone, RPH   60 mg at 04/01/16 2117  . finasteride (PROSCAR) tablet 5 mg  5 mg Oral Daily Clanford Cyndie Mull, MD   5 mg at 04/02/16 1158  . ipratropium-albuterol (DUONEB) 0.5-2.5 (3) MG/3ML nebulizer solution 3 mL  3 mL Nebulization BID Clanford L Johnson, MD   3 mL at 04/02/16 1042  . lamoTRIgine (LAMICTAL) tablet 400 mg  400 mg Oral BID Clanford Cyndie Mull, MD   400 mg at 04/02/16 1156  . levETIRAcetam (KEPPRA) tablet 1,000 mg  1,000 mg Oral QPM Clanford Cyndie Mull, MD   1,000 mg at 04/01/16 1735  . levETIRAcetam (KEPPRA) tablet 1,250 mg  1,250 mg Oral q morning - 10a Clanford Cyndie Mull, MD   1,250 mg at 04/02/16 1157  . metoprolol tartrate (LOPRESSOR) tablet 25 mg  25 mg Oral TID Clanford Cyndie Mull, MD   25 mg at 04/02/16 1535  . montelukast (SINGULAIR) tablet 10 mg  10 mg Oral QHS Clanford Cyndie Mull, MD   10 mg at 04/01/16 2118  . mupirocin ointment (BACTROBAN) 2 % 1 application  1 application Nasal BID Mallie Darting, PA-C   1 application at 04/02/16 1159  . ondansetron (ZOFRAN) tablet 4 mg  4 mg Oral Q6H PRN Clanford Cyndie Mull, MD       Or  . ondansetron (ZOFRAN) injection 4 mg  4 mg Intravenous Q6H PRN Clanford L Johnson, MD      . pantoprazole (PROTONIX) EC tablet 40 mg  40 mg Oral Q0600 Clanford Cyndie Mull, MD   40 mg at 04/02/16 0542  . piperacillin-tazobactam (ZOSYN) IVPB 3.375 g  3.375 g Intravenous Q8H Emi Holes, RPH   3.375 g at 04/02/16 1535  . senna-docusate (Senokot-S) tablet 1 tablet  1 tablet Oral QHS PRN Clanford L Johnson, MD      . sodium chloride flush (NS) 0.9 % injection 10-40 mL   10-40 mL Intracatheter Q12H Clanford Cyndie Mull, MD   10 mL at 03/31/16 1126  . sodium chloride flush (NS) 0.9 % injection 10-40 mL  10-40 mL Intracatheter PRN Clanford Cyndie Mull, MD   10 mL at 04/02/16 0530  . tamsulosin (FLOMAX) capsule 0.4 mg  0.4 mg Oral QPC supper Clanford Cyndie Mull, MD  0.4 mg at 04/01/16 1735  . vancomycin (VANCOCIN) 1,250 mg in sodium chloride 0.9 % 250 mL IVPB  1,250 mg Intravenous Q12H Almon HerculesHaley P Baird, RPH   1,250 mg at 04/02/16 1535     Discharge Medications: Please see discharge summary for a list of discharge medications.  Relevant Imaging Results:  Relevant Lab Results:   Additional Information ss#[id number].  Patient is MRSA PCR positive  Okey Duprerawford, Lazaro ArmsVanessa Bradley, LCSW

## 2016-04-02 NOTE — Care Management Note (Signed)
Case Management Note  Patient Details  Name: Maddux Vanscyoc MRN: [id number] Date of Birth: Oct 10, 1951  Subjective/Objective:    CM following for progression and d/c planning.                 Action/Plan: 04/02/2016 Met with pt and sister, pt is SNF resident. CSW following pt. IM discussed, no HH or DME needs as plan is to return to SNF. Will continue to follow.   Expected Discharge Date:                  Expected Discharge Plan:  Skilled Nursing Facility  In-House Referral:  Clinical Social Work  Discharge planning Services  CM Consult  Post Acute Care Choice:  NA Choice offered to:  NA  DME Arranged:  N/A DME Agency:  NA  HH Arranged:  NA HH Agency:  NA  Status of Service:  Completed, signed off  Medicare Important Message Given:  Yes Date Medicare IM Given:    Medicare IM give by:    Date Additional Medicare IM Given:    Additional Medicare Important Message give by:     If discussed at [place] of Stay Meetings, dates discussed:    Additional Comments:  Adron Bene, RN 04/02/2016, 12:59 PM

## 2016-04-02 NOTE — Significant Event (Signed)
Patient still complaining of pain when turning to the left side.  Sister very concerned and states that the patient has been complaining of the same pain for the last 3-4 weeks.  Patient states that the pain gets "better once he is off his side".  Patient is tender to palpation to left lower quadrant.

## 2016-04-02 NOTE — Progress Notes (Signed)
Speech Language Pathology Treatment: Dysphagia  Patient Details Name: Austin Hartman MRN: [id number] DOB: 1951-01-23 Today's Date: 04/02/2016 Time: 1415-1430 SLP Time Calculation (min) (ACUTE ONLY): 15 min  Assessment / Plan / Recommendation Clinical Impression  Pt consumed current diet of Dysphagia 2/thin via cup/straw without any overt s/s of aspiration noted throughout snack; pt was modified independent with verbal cues with current swallowing strategies of slow rate and small bites/sips. Pt educated, goals met for diet tolerance and swallowing precautions; ST will s/o at this time.   HPI HPI: 65 year old male admitted with severe sepsis. PMH significant for TBI (1974), CVA, seizures, CHF, dysphagia      SLP Plan  All goals met;Discharge SLP treatment due to goals being met     Recommendations  Diet recommendations: Dysphagia 2 (fine chop);Thin liquid Liquids provided via: Cup;Straw Medication Administration: Whole meds with liquid Supervision: Staff to assist with self feeding Compensations: Minimize environmental distractions;Slow rate;Small sips/bites Postural Changes and/or Swallow Maneuvers: Seated upright 90 degrees             Oral Care Recommendations: Oral care BID Follow up Recommendations: Other (comment) (Con't ST at current facility) Plan: All goals met;Discharge SLP treatment due to (comment)                     Gwendolyn Nishi,PAT, M.S., CCC-SLP 04/02/2016, 3:32 PM

## 2016-04-02 NOTE — Clinical Social Work Note (Signed)
Clinical Social Work Assessment  Patient Details  Name: Austin Hartman MRN: [id number] Date of Birth: 09-26-1951  Date of referral:  04/02/16               Reason for consult:  Facility Placement                Permission sought to share information with:  Family Supports Permission granted to share information::  Yes, Verbal Permission Granted  Name::     Austin Hartman  Agency::     Relationship::  Sister  Contact Information:  [phone]  Housing/Transportation Living arrangements for the past 2 months:  Skilled Nursing Facility [facility] Source of Information:  Patient, Other (Comment Required) (Sister Austin Hartman was at the bedside) Patient Interpreter Needed:  None Criminal Activity/Legal Involvement Pertinent to Current Situation/Hospitalization:  No - Comment as needed Significant Relationships:  Siblings Programmer, multimedia(Austin Hartman) Lives with:    Do you feel safe going back to the place where you live?  Yes Need for family participation in patient care:  Yes (Comment)  Care giving concerns:  None expressed by patient or sister at the bedside.   Social Worker assessment / plan:  CSW talked with patient and sister, who was at the bedside regarding discharge planning. Patient was awake, alert and sitting up in bed and responded to CSW upon entry into the room. Patient's speech slurred, however he (and sister) confirmed that [facility] when [facility].  Employment status:  Disabled (Comment on whether or not currently receiving Disability) Insurance information:  [place], [place] Care (UHC/GEHA) PT Recommendations:  Not assessed at this time Information / Referral to community resources:  Other (Comment Required) (No information needed or requested at this time as patient is from a facility)  Patient/Family's Response to care: No concerns expressed by patient/sister regarding care during hospitalization.  Patient/Family's Understanding of  and Emotional Response to Diagnosis, Current Treatment, and Prognosis:  Not discussed.  Emotional Assessment Appearance:  Appears stated age Attitude/Demeanor/Rapport:  Other (Appropriate) Affect (typically observed):  Appropriate Orientation:  Oriented to Self, Oriented to Place, Oriented to  Time, Oriented to Situation Alcohol / Substance use:  Never Used Psych involvement (Current and /or in the community):  No (Comment)  Discharge Needs  Concerns to be addressed:  Discharge Planning Concerns Readmission within the last 30 days:  No Current discharge risk:  None Barriers to Discharge:  No Barriers Identified   Austin GoldmannCrawford, Austin Hartman 04/02/2016, 3:52 PM

## 2016-04-02 NOTE — Progress Notes (Signed)
PROGRESS NOTE    Austin GourdMichael Hartman  WJX:[id number]  DOB: 10-13-1951  DOA: 03/29/2016 PCP: No primary care provider on file. Outpatient Specialists:  Hospital course: Austin GourdMichael Hartman is a 65 y.o. male with a significant history of traumatic brain injury and [facility]. 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 He has had multiple recent admissions for sepsis syndrome. He did have sepsis admission approximately one week ago at [facility]. Approximate 3 weeks prior to that he also was admitted there for sepsis. His today family members are with him and they are very supportive and involved. They decided to bring him to [facility] for this admission. He had been doing well since he was discharged from [facility] until today. They sat down to noon meal and he suddenly became pale and diaphoretic and confused. His blood pressure dropped and he remained confused and was found to be hypotensive and hypothermic. Upon medic arrival the patient was cool and diaphoretic. They established an IO in the left shoulder to initiate fluid resuscitation. Patient had difficult IV access. Patient was complaining of generalized pain and was confused and altered mentally. He was noted to have an elevated lactate of >3. He had complained of some abdominal pain. He has long history of chronic constipation but has had bowel movements recently per family. He was noted today to have copious watery stool output that eventually subsided. His family says that he has done this before with sepsis. Pt admitted for stepdown ICU care and then transferred to telemetry.   Assessment & Plan:  Severe Sepsis resolved - secondary to GI infection. Clinically improved.  following blood cultures, neg to date,   clinically patient has been improving. Possible GI source of infection given CT findings.  I asked for general surgery to evaluate. Broad spectrum IV antibiotics will be continued for now.  Plan to switch to oral antibiotics  6/13 and possible DC.    Altered Mental State - improved with intensive resuscitation. This was likely a result of the hypotension, hypothermia and sepsis. Neuro checks ordered.   Hypothermia - resolved now.  He did not require warmed IV fluids. This was likely from bacteremia/sepsis. Foley discontinued.   Hypotension - blood pressures stabilized after aggressive IVF hydration for sepsis protocol. Will monitor closely. No need for pressors at this time. Family was asked and they are fine with short term use of pressors if needed.   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 He is being placed on seizure precautions and will prescribe valium to control seizures. After many years of experience caring for patient, family is adamant that valium has been the most effective treatment for controlling the patient's seizures.   Abdominal Pain resolved now - CT abdomen: ischemia versus infection.Surgery leaning more towards infection.  Responding to antibiotics.  Pt does have history of chronic constipation. C. Diff negative.   Chronic Constipation - Discontinued laxatives at this time because of copious diarrhea on admission but will resume when appropriate. Waiting for him to have a BM.   Diarrhea - Resolved now.  C diff negative.  DC rectal tube 6/11.   Dysphagia - Pt has been managing with elevating the head of bed with eating and drinking. Would observe aspiration precautions. Elevate HOB 90 deg when eating.  Consulted SLP.   DNR - Had a long discussion with family. Pt has been progressively declining over recent months and they have elected to  make him DNR. They would like to continue full medical care at this time.   Multiple recent hospitalizations: I placed order to request medical records from [facility] for recent admissions.   CAD - normal troponin, no symptoms of chest pain, no acute EKG  changes, resuming home plavix for now.   Atrial arrythmias and atrial fibrillation - sister says she thinks that he has a  History of Afib - will be calling his cardiologist in [place] to verify.    Acute Renal Insufficiency - Normalized after hydration.   Hyperglycemia - likely stress reaction, no known history of diabetes mellitus. BS better now.  Hypokalemia - replete orally today.    DVT Prophylaxis: lovenox Code Status: DNR Family Communication: spoke at length sister at bedside  Disposition Plan: anticipate return to [facility] 1-2 days Consultants:  General Surgery  Procedures:  Picc Line Placement 03/30/16  Antimicrobials: Anti-infectives    Start     Dose/Rate Route Frequency Ordered Stop   04/01/16 0330  vancomycin (VANCOCIN) 1,250 mg in sodium chloride 0.9 % 250 mL IVPB     1,250 mg 166.7 mL/hr over 90 Minutes Intravenous Every 12 hours 03/31/16 1625     03/30/16 0330  vancomycin (VANCOCIN) IVPB 1000 mg/200 mL premix  Status:  Discontinued     1,000 mg 200 mL/hr over 60 Minutes Intravenous Every 12 hours 03/29/16 1506 03/31/16 1625   03/29/16 2300  piperacillin-tazobactam (ZOSYN) IVPB 3.375 g     3.375 g 12.5 mL/hr over 240 Minutes Intravenous Every 8 hours 03/29/16 1506     03/29/16 1730  metroNIDAZOLE (FLAGYL) IVPB 500 mg  Status:  Discontinued     500 mg 100 mL/hr over 60 Minutes Intravenous Every 8 hours 03/29/16 1720 03/30/16 1018   03/29/16 1500  piperacillin-tazobactam (ZOSYN) IVPB 3.375 g     3.375 g 100 mL/hr over 30 Minutes Intravenous  Once 03/29/16 1446 03/29/16 1547   03/29/16 1500  vancomycin (VANCOCIN) IVPB 1000 mg/200 mL premix  Status:  Discontinued     1,000 mg 200 mL/hr over 60 Minutes Intravenous  Once 03/29/16 1446 03/29/16 1447   03/29/16 1500  vancomycin (VANCOCIN) 2,000 mg in sodium chloride 0.9 % 500 mL IVPB     2,000 mg 250 mL/hr over 120 Minutes Intravenous  Once 03/29/16 1447 03/29/16 1923     Subjective: Pt reports  feeling much better.  Had some atrial fibrillation and atrial flutter on monitor  Objective: Filed Vitals:   04/01/16 1456 04/01/16 1645 04/01/16 2152 04/02/16 0451  BP:  121/89 113/88 118/80  Pulse:  110 47 97  Temp:  98 F (36.7 C) 98.3 F (36.8 C) 98.1 F (36.7 C)  TempSrc:  Oral Oral Oral  Resp:  Height:      Weight:   301 lb 11.2 oz (136.85 kg)   SpO2: 97% 98% 95% 97%    Intake/Output Summary (Last 24 hours) at 04/02/16 0807 Last data filed at 04/01/16 2310  Gross per 24 hour  Intake 1091.33 ml  Output   2150 ml  Net -1058.67 ml   Filed Weights   03/29/16 1434 03/29/16 2122 04/01/16 2152  Weight: 280 lb (127.007 kg) 284 lb 6.3 oz (129 kg) 301 lb 11.2 oz (136.85 kg)    Exam:   General exam: Pt appears well nourished lying supine on the gurney in no obvious distress. Pt cooperative for exam.   Head, eyes and ENT: pronounced TBI noted; Oral mucosa dry.  Neck: Supple. No JVD, carotid bruit or thyromegaly.  Lymphatics: No lymphadenopathy.  Respiratory system: bilateral upper airway expiratory wheeze heard.  Cardiovascular system: normal s1, s2 sounds.  Gastrointestinal system: Abdomen is soft and nontender, no guarding and Normal bowel sounds heard. No organomegaly or masses appreciated.  Central nervous system: awake and oriented, pronounced left hemiparesis.  Extremities: Peripheral pulses symmetrically felt.   Skin: No rashes or acute findings.  Psychiatry: Pleasant and cooperative  Data Reviewed: Basic Metabolic Panel:  Recent Labs Lab 03/29/16 1415 03/30/16 0949 03/31/16 0329 04/02/16 0530  NA 139 141 138 140  K 4.4 3.5 3.5 3.4*  CL 104 111 109 108  CO2 26 24 23 27   GLUCOSE 152* 110* 90 79  BUN 12 14 12  5*  CREATININE 1.32* 1.02 0.98 0.84  CALCIUM 10.2 8.4* 8.2* 8.4*   Liver Function Tests:  Recent Labs Lab 03/29/16 1415 03/30/16 0949  AST 31 27  ALT 25 20  ALKPHOS 116 73  BILITOT 0.5 0.9  PROT 8.2* 6.0*    ALBUMIN 3.6 2.6*   No results for input(s): LIPASE, AMYLASE in the last 168 hours. No results for input(s): AMMONIA in the last 168 hours. CBC:  Recent Labs Lab 03/29/16 1415 03/30/16 0949 03/31/16 0329  WBC 14.9* 10.6* 9.1  NEUTROABS 11.7* 8.5*  --   HGB 13.4 10.9* 10.6*  HCT 41.8 34.5* 33.9*  MCV 93.9 93.5 95.2  PLT 287 232 218   Cardiac Enzymes: No results for input(s): CKTOTAL, CKMB, CKMBINDEX, TROPONINI in the last 168 hours. BNP (last 3 results) No results for input(s): PROBNP in the last 8760 hours. CBG:  Recent Labs Lab 03/30/16 1724 03/30/16 2003  GLUCAP 115* 147*    Recent Results (from the past 240 hour(s))  Culture, blood (Routine x 2)     Status: None (Preliminary result)   Collection Time: 03/29/16  5:55 PM  Result Value Ref Range Status   Specimen Description BLOOD RIGHT HAND  Final   Special Requests IN PEDIATRIC BOTTLE 1CC  Final   Culture NO GROWTH 3 DAYS  Final   Report Status PENDING  Incomplete  Urine culture     Status: Abnormal   Collection Time: 03/29/16  8:12 PM  Result Value Ref Range Status   Specimen Description URINE, CLEAN CATCH  Final   Special Requests NONE  Final   Culture 3,000 COLONIES/mL INSIGNIFICANT GROWTH (A)  Final   Report Status 03/30/2016 FINAL  Final  MRSA PCR Screening     Status: Abnormal   Collection Time: 03/29/16  9:46 PM  Result Value Ref Range Status   MRSA by PCR POSITIVE (A) NEGATIVE Final    Comment: RESULT CALLED TO, READ BACK BY AND VERIFIED WITH: RN B REAT AT 2300 [id number] MARTINB        The GeneXpert MRSA Assay (FDA approved for NASAL specimens only), is one component of a comprehensive MRSA colonization surveillance program. It is not intended to diagnose MRSA infection nor to guide or monitor treatment for MRSA infections.   Gastrointestinal Panel by PCR , Stool     Status: None   Collection Time: 03/30/16  6:32 AM  Result Value Ref Range Status   Campylobacter species NOT DETECTED NOT  DETECTED Final   Plesimonas shigelloides NOT DETECTED NOT DETECTED Final   Salmonella species NOT DETECTED NOT DETECTED Final   Yersinia enterocolitica NOT DETECTED NOT DETECTED Final   Vibrio species NOT DETECTED NOT DETECTED Final   Vibrio cholerae NOT DETECTED NOT  DETECTED Final   Enteroaggregative E coli (EAEC) NOT DETECTED NOT DETECTED Final   Enteropathogenic E coli (EPEC) NOT DETECTED NOT DETECTED Final   Enterotoxigenic E coli (ETEC) NOT DETECTED NOT DETECTED Final   Shiga like toxin producing E coli (STEC) NOT DETECTED NOT DETECTED Final   E. coli O157 NOT DETECTED NOT DETECTED Final   Shigella/Enteroinvasive E coli (EIEC) NOT DETECTED NOT DETECTED Final   Cryptosporidium NOT DETECTED NOT DETECTED Final   Cyclospora cayetanensis NOT DETECTED NOT DETECTED Final   Entamoeba histolytica NOT DETECTED NOT DETECTED Final   Giardia lamblia NOT DETECTED NOT DETECTED Final   Adenovirus F40/41 NOT DETECTED NOT DETECTED Final   Astrovirus NOT DETECTED NOT DETECTED Final   Norovirus GI/GII NOT DETECTED NOT DETECTED Final   Rotavirus A NOT DETECTED NOT DETECTED Final   Sapovirus (I, II, IV, and V) NOT DETECTED NOT DETECTED Final  C difficile quick scan w PCR reflex     Status: None   Collection Time: 03/30/16  6:32 AM  Result Value Ref Range Status   C Diff antigen NEGATIVE NEGATIVE Final   C Diff toxin NEGATIVE NEGATIVE Final   C Diff interpretation Negative for toxigenic C. difficile  Final    Studies: No results found.  Scheduled Meds: . acidophilus  1 capsule Oral Daily  . antiseptic oral rinse  7 mL Mouth Rinse BID  . atorvastatin  20 mg Oral Daily  . bethanechol  25 mg Oral TID  . Chlorhexidine Gluconate Cloth  6 each Topical Q0600  . cloBAZam  40 mg Oral QPM  . clopidogrel  75 mg Oral Daily  . digoxin  0.25 mg Oral Daily  . enoxaparin (LOVENOX) injection  60 mg Subcutaneous Q24H  . finasteride  5 mg Oral Daily  . ipratropium-albuterol  3 mL Nebulization BID  .  lamoTRIgine  400 mg Oral BID  . levETIRAcetam  1,000 mg Oral QPM  . levETIRAcetam  1,250 mg Oral q morning - 10a  . metoprolol tartrate  25 mg Oral TID  . montelukast  10 mg Oral QHS  . mupirocin ointment  1 application Nasal BID  . pantoprazole  40 mg Oral Q0600  . piperacillin-tazobactam (ZOSYN)  IV  3.375 g Intravenous Q8H  . potassium chloride  40 mEq Oral Once  . sodium chloride flush  10-40 mL Intracatheter Q12H  . tamsulosin  0.4 mg Oral QPC supper  . vancomycin  1,250 mg Intravenous Q12H   Continuous Infusions: . sodium chloride 10 mL/hr at 04/01/16 1032   Principal Problem:   Severe sepsis (HCC) Active Problems:   Sepsis (HCC)   Hypothermia   Epilepsy (HCC)   Abdominal pain, generalized   Hypotension, unspecified   DNR (do not resuscitate)   Leukocytosis   TBI (traumatic brain injury) (HCC)   Dysphagia   Coronary artery disease   Diarrhea   History of traumatic brain injury   Chronic constipation   Altered mental state   At high risk for aspiration   CHF (congestive heart failure) (HCC)   Left hemiparesis (HCC)  Time spent: 25 mins  Standley Dakins, MD, FAAFP [facility] Pager [phone]  If 7PM-7AM, please contact night-coverage [email or web address] Password [email or web address] 04/02/2016, 8:07 AM    LOS: 4 days

## 2016-04-03 LAB — CULTURE, BLOOD (ROUTINE X 2): Culture: NO GROWTH

## 2016-04-03 LAB — GLUCOSE, CAPILLARY: Glucose-Capillary: 86 mg/dL (ref 65–99)

## 2016-04-03 MED ORDER — CIPROFLOXACIN HCL 500 MG PO TABS: 500.0000 mg | ORAL_TABLET | Freq: Two times a day (BID) | ORAL | Status: AC

## 2016-04-03 MED ORDER — METRONIDAZOLE 500 MG PO TABS: 500.0000 mg | ORAL_TABLET | Freq: Three times a day (TID) | ORAL | Status: AC

## 2016-04-03 MED ORDER — AMOXICILLIN-POT CLAVULANATE 875-125 MG PO TABS: 1.0000 | ORAL_TABLET | Freq: Two times a day (BID) | ORAL | Status: DC

## 2016-04-03 MED ORDER — METRONIDAZOLE 500 MG PO TABS: 500.0000 mg | ORAL_TABLET | Freq: Three times a day (TID) | ORAL | Status: DC

## 2016-04-03 MED ORDER — PANTOPRAZOLE SODIUM 40 MG PO TBEC: 40.0000 mg | DELAYED_RELEASE_TABLET | Freq: Every day | ORAL | Status: DC

## 2016-04-03 NOTE — Discharge Instructions (Signed)
Recommendations for Outpatient Follow-up:  1. Follow up with PCP in 1-2 weeks 2. Follow up with GI for colonoscopy in next 3-4 weeks 3. Follow up with cardiologist in 1 month   Discharge Condition: Stable   Diet recommendation:  Diet recommendations: Dysphagia 2 (fine chop);Thin liquid Liquids provided via: Cup;Straw Medication Administration: Whole meds with liquid Supervision: Staff to assist with self feeding Compensations: Minimize environmental distractions;Slow rate;Small sips/bites Postural Changes and/or Swallow Maneuvers: Seated upright 90 degrees    Oral Care Recommendations: Oral care BID         Return if symptoms come back or new problems develop.     Abdominal Pain, Adult Many things can cause belly (abdominal) pain. Most times, the belly pain is not dangerous. Many cases of belly pain can be watched and treated at home. HOME CARE   Do not take medicines that help you go poop (laxatives) unless told to by your doctor.  Only take medicine as told by your doctor.  Eat or drink as told by your doctor. Your doctor will tell you if you should be on a special diet. GET HELP IF:  You do not know what is causing your belly pain.  You have belly pain while you are sick to your stomach (nauseous) or have runny poop (diarrhea).  You have pain while you pee or poop.  Your belly pain wakes you up at night.  You have belly pain that gets worse or better when you eat.  You have belly pain that gets worse when you eat fatty foods.  You have a fever. GET HELP RIGHT AWAY IF:   The pain does not go away within 2 hours.  You keep throwing up (vomiting).  The pain changes and is only in the right or left part of the belly.  You have bloody or tarry looking poop. MAKE SURE YOU:   Understand these instructions.  Will watch your condition.  Will get help right away if you are not doing well or get worse.   This information is not intended to replace advice  given to you by your health care provider. Make sure you discuss any questions you have with your health care provider.   Document Released: 03/26/2008 Document Revised: 10/29/2014 Document Reviewed: 06/17/2013 Elsevier Interactive Patient Education 2016 Elsevier Inc.  Dysphagia Diet Level 2, Mechanically Altered The dysphagia level 2 diet includes foods that are blended, chopped, ground, or mashed so they are easier to chew and swallow. The foods are soft, moist, and can be chopped into -inch chunks (such as pancakes, pasta, and bananas). In order to be on this diet, you must be able to chew. This diet helps you transition between the pureed textures of the dysphagia level 1 diet to more solid textures. This diet is helpful for people with mild to moderate swallowing difficulties. It reduces the risk of food getting caught in the windpipe, trachea, or lungs.  You may need help or supervision during meals while following this diet so that you eat safely. You will be on this diet until your health care provider advances the texture of your diet.  WHAT DO I NEED TO KNOW ABOUT THIS DIET? Foods  You may eat foods that are soft and moist.  You may need to use a blender, whisk, or masher to soften some of your foods.  You can moisten foods with gravies, sauces, vegetable or fruit juice, milk, half and half, or water when blending, mashing, or grinding your foods  to the right consistency.  If you were on the dysphagia level 1 diet, you may still eat any of the foods included in that diet.  Avoid foods that are dry, hard, sticky, chewy, coarse, and crunchy. Also avoid large cuts of food.  Take small bites. Each bite should contain  inch or less of food. Liquids  Avoid liquids with seeds and chunks.  Thicken liquids, if instructed by your health care provider. Your health care provider will tell you the consistency to which you should thicken your liquids for safe swallowing. To thicken a  liquid, use a commercial thickener or a thickening food (such as rice cereal or potato flakes). Ask your health care provider for specific recommendations on thickeners. See your dietitian or health care provider regularly for help with your dietary changes. WHAT FOODS CAN I EAT? Grains Store-bought soft breads that do not have nuts or seeds. Pancakes, sweet rolls, [place]anish pastries, and [place] toast that have been moistened with syrup or sauce to form a slurry when blended. Well-cooked pasta, noodles, and bread dressing. Well-cooked noodles and pasta in sauce. Moist macaroni and cheese. Soft dumplings or spaetzle with gravy or butter. Cooked cereals (including oatmeal). Low-texture dry cereals, such as rice puff, corn, or wheat-flake cereals, with milk (if thin liquids are not allowed, make sure all of the milk is absorbed by the cereal before eating it). Vegetables Very soft, well-cooked vegetables in pieces less than  inch in size. Cooked potatoes that are moist, not crispy, and with sauce. Fruits Canned or cooked fruits that are soft or moist and do not have skin or seeds. Fresh, soft bananas. Fruit juices with a small amount of pulp (if thin liquids are allowed). Gelatin or plain gelatin with canned fruit, except pineapple. Meat and Other Protein Sources Tender, moist meats, poultry, or fish cooked with gravy or sauce and cubed to -inch bites or smaller. Ground meat. Moist meatball or meatloaf. Fish without bones. Moist casseroles without rice. Tuna, egg, or meat salad without chunks or hard-to-chew vegetables, such as celery and onions. Smooth quiche without large chunks. Scrambled, poached, or soft-cooked eggs with butter, margarine, sauce, or gravy. Tofu. Well-cooked, moistened and mashed beans, peas, baked beans, and other legumes. Casseroles without rice (such as tuna noodle casserole or soft moist meat lasagna). Dairy Cream cheese. Yogurt. Cottage cheese. Ask your health care provider if milk  is allowed. Sweets/Desserts Pudding. Custard. Soft fruit pies with crust on the bottom only. Crisps and cobblers without seeds or nuts and with soft crusts. Soft, moist cakes. Icing. Pre-gelled cookies. Soft, moist cookies dunked in milk, coffee, or another liquid. Jelly. Soft, smooth chocolate bars that are easily chewed. Jams and preserves without seeds. Ask your health care provider whether you can have frozen desserts. Fats and Oils Butter. Margarine. Cream for cereal, depending on liquid consistency allowed. Gravy. Cream sauces. Mayonnaise. Salad dressings. Cream cheese. Cheese spreads, plain or with soft fruits or vegetables added. Sour cream. Sour cream dips with soft fruits or vegetables added. Whipped toppings. Other Sauces and salsas that have soft chunks that are about  inch or smaller. The items listed above may not be a complete list of recommended foods or beverages. Contact your dietitian for more options. WHAT FOODS ARE NOT RECOMMENDED? Grains All breads not listed in the recommended list. Breads that are hard or have nuts or seeds. Coarse cereals. Cereals that have nuts, seeds, dried fruits, or coconut. Rice. Corn. Vegetables Whole, raw, frozen, or dried vegetables. Tough, fibrous,  chewy, or stringy cooked vegetables, such as celery, peas, broccoli, cabbage, Brussels sprouts, and asparagus. Potato skins. Potato and other vegetable chips. Fried or French-fried potatoes. Cooked corn and peas. Fruits Whole raw, frozen, or dried fruits, including coconut. Pineapple. Fruits with seeds. Meat and Other Protein Sources Dry, tough meats, such as bacon, sausage, and hot dogs. Cheese slices and cubes. Peanut butter. Hard boiled or fried eggs. Nuts. Seeds. Pizza. Sandwiches. Dry casseroles or casseroles with rice or large chunks. Dairy Yogurt with nuts, seeds, or large chunks. Sweets/Desserts Coarse, hard, chewy, or sticky desserts. Any dessert with nuts, seeds, coconut, pineapple, or dried  fruit. Ask your health care provider whether you can have frozen desserts. Fats and Oils Avoid fats with chunky, large textures, such as those with nuts or fruits. Other Soups and casseroles with large chunks. The items listed above may not be a complete list of foods and beverages to avoid. Contact your dietitian for more information.   This information is not intended to replace advice given to you by your health care provider. Make sure you discuss any questions you have with your health care provider.   Document Released: 10/08/2005 Document Revised: 10/29/2014 Document Reviewed: 09/21/2013 Elsevier Interactive Patient Education 2016 Elsevier Inc.  Sepsis, Adult Sepsis is a serious infection of your blood or tissues that affects your whole body. The infection that causes sepsis may be bacterial, viral, fungal, or parasitic. Sepsis may be life threatening. Sepsis can cause your blood pressure to drop. This may result in shock. Shock causes your central nervous system and your organs to stop working correctly.  RISK FACTORS Sepsis can happen in anyone, but it is more likely to happen in people who have weakened immune systems. SIGNS AND SYMPTOMS  Symptoms of sepsis can include:  Fever or low body temperature (hypothermia).  Rapid breathing (hyperventilation).  Chills.  Rapid heartbeat (tachycardia).  Confusion or light-headedness.  Trouble breathing.  Urinating much less than usual.  Cool, clammy skin or red, flushed skin.  Other problems with the heart, kidneys, or brain. DIAGNOSIS  Your health care provider will likely do tests to look for an infection, to see if the infection has spread to your blood, and to see how serious your condition is. Tests can include:  Blood tests, including cultures of your blood.  Cultures of other fluids from your body, such as:  Urine.  Pus from wounds.  Mucus coughed up from your lungs.  Urine tests other than cultures.  X-ray  exams or other imaging tests. TREATMENT  Treatment will begin with elimination of the source of infection. If your sepsis is likely caused by a bacterial or fungal infection, you will be given antibiotic or antifungal medicines. You may also receive:  Oxygen.  Fluids through an IV tube.  Medicines to increase your blood pressure.  A machine to clean your blood (dialysis) if your kidneys fail.  A machine to help you breathe if your lungs fail. SEEK IMMEDIATE MEDICAL CARE IF: You get an infection or develop any of the signs and symptoms of sepsis after surgery or a hospitalization.   This information is not intended to replace advice given to you by your health care provider. Make sure you discuss any questions you have with your health care provider.   Document Released: 07/07/2003 Document Revised: 02/22/2015 Document Reviewed: 06/15/2013 Elsevier Interactive Patient Education [employer]

## 2016-04-03 NOTE — Clinical Social Work Note (Signed)
Patient medically stable for discharge back to [facility].  Admissions director at SNF informed and discharge information transmitted to facility. [name] will be transported by ambulance. Sister Austin Hartman contacted and informed.  Genelle [name], MSW, LCSW Licensed Clinical Social Worker Clinical Social Work Department [facility] [phone]

## 2016-04-03 NOTE — Discharge Summary (Addendum)
Physician Discharge Summary  Austin Hartman JXB:[id number] DOB: 01-10-1951 DOA: 03/29/2016  PCP: No primary care provider on file.  Admit date: 03/29/2016 Discharge date: 04/03/2016  [facility]position:  [facility]  Recommendations for Outpatient Follow-up:  1. Follow up with PCP in 1-2 weeks 2. Follow up with GI for colonoscopy in next 3-4 weeks 3. 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 male with a significant history of traumatic brain injury and [facility]. 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 He has had multiple recent admissions for sepsis syndrome. He did have sepsis admission approximately one week ago at [facility]. Approximate 3 weeks prior to that he also was admitted there for sepsis. His today family members are with him and they are very supportive and involved. They decided [facility]. He had been doing well since he was discharged [facility] until today. They sat down to noon meal and he suddenly became pale and diaphoretic and confused. His blood pressure dropped and he remained confused and was found to be hypotensive and hypothermic. Upon medic arrival the patient was cool and diaphoretic. They established an IO in the left shoulder to initiate fluid resuscitation. Patient had difficult IV access. Patient was complaining of  generalized pain and was confused and altered mentally. He was noted to have an elevated lactate of >3. He had complained of some abdominal pain. He has long history of chronic constipation but has had bowel movements recently per family. He was noted today to have copious watery stool output that eventually subsided. His family says that he has done this before with sepsis. Pt admitted for stepdown ICU care and then transferred to telemetry.   Assessment & Plan:  Severe Sepsis resolved - secondary to GI infection. Clinically improved. following blood cultures, neg to date,clinically patient has been improving. Possible GI source of infection given CT findings. I asked for general surgery to evaluate. Broad spectrum IV antibiotics given and patient rapidly improved. Plan to switch to oral antibiotics 6/13 and DC to SNF.    Altered Mental State - improved with intensive resuscitation. This was likely a result of the hypotension, hypothermia and sepsis. Neuro checks followed. Pt improved to baseline per sister.    Hypothermia - resolved now.  He did not require warmed IV fluids. This was likely from bacteremia/sepsis. Foley discontinued and passed voiding trials.    Hypotension - blood pressures stabilized after aggressive IVF hydration for sepsis protocol. No need for pressors at this time. Family was asked and they are fine with short term use of pressors if needed but didn't require them.    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 He is being placed on seizure precautions and used valium to control seizures as this normally works well. After many years of experience caring for  patient, family is adamant that valium has been the most effective treatment for controlling the patient's seizures.   Abdominal Pain resolved now - CT abdomen: ischemia versus infection.Surgery consulted and leaning more  towards infection and Responding to antibiotics. Pt did not require surgery.  Pt does have history of chronic constipation. C. Diff negative. I discussed with sister, patient will need to follow up with GI for colonoscopy in next few weeks.  She verbalized understanding.    Chronic Constipation - Discontinued laxatives at this time because of copious diarrhea on admission but will resume when appropriate. He had a BM prior to discharge.  Home on his regular laxatives.    Diarrhea - Resolved now. C diff negative. DC rectal tube 6/11.   Dysphagia - Pt has been managing with elevating the head of bed with eating and drinking. Would observe aspiration precautions. Elevate HOB 90 deg when eating. Consulted SLP. See diet above.   DNR - Had a long discussion with family. Pt has been progressively declining over recent months and they have elected to make him DNR. They would like to continue full medical care at this time.   Multiple recent hospitalizations: I placed order to request medical records from [facility] for recent admissions.   CAD - normal troponin, no symptoms of chest pain, no acute EKG changes, resuming home plavix for now.   Atrial arrythmias and atrial fibrillation - Pt to follow up with his cardiologist in [place]. He has chronic atrial fibrillation.   Acute Renal Insufficiency - Normalized after hydration.   Hyperglycemia - likely stress reaction, no known history of diabetes mellitus. BS better now.  Chronic CHF - remained compensated and stable during hospitalization, follow up with cardiologist recommended.  Hypokalemia - repleted orally.  DVT Prophylaxis: lovenox Code Status: DNR Family Communication: spoke at length sister at bedside  Disposition Plan: anticipate return to [facility]  Consultants:  General Surgery  Procedures:  Picc Line Placement 03/30/16, discontinued 6/13 prior to discharge  Discharge Diagnoses:  Active  Problems:   Sepsis (HCC)   Hypothermia   Epilepsy (HCC)   Abdominal pain, generalized   Hypotension, unspecified   DNR (do not resuscitate)   Leukocytosis   TBI (traumatic brain injury) (HCC)   Dysphagia   Coronary artery disease   Diarrhea   History of traumatic brain injury   Chronic constipation   Altered mental state   At high risk for aspiration   CHF (congestive heart failure) (HCC)   Left hemiparesis [facility])   Discharge Instructions      Discharge Instructions    Discharge instructions    Complete by:  As directed   Please follow up with primary care provider in 1-2 weeks for recheck.            Medication List    STOP taking these medications        diphenhydrAMINE 25 MG tablet  Commonly known as:  BENADRYL     furosemide 20 MG tablet  Commonly known as:  LASIX     lactulose (encephalopathy) 10 GM/15ML Soln  Commonly known as:  CHRONULAC     loratadine 10 MG tablet  Commonly known as:  CLARITIN     oxyCODONE 5 MG immediate release tablet  Commonly known as:  Oxy IR/ROXICODONE     polyethylene glycol packet  Commonly known as:  MIRALAX / GLYCOLAX     promethazine 25 MG tablet  Commonly known as:  PHENERGAN  TAKE these medications        atorvastatin 20 MG tablet  Commonly known as:  LIPITOR  Take 20 mg by mouth daily.     bethanechol 25 MG tablet  Commonly known as:  URECHOLINE  Take 25 mg by mouth 3 (three) times daily.     ciprofloxacin 500 MG tablet  Commonly known as:  CIPRO  Take 1 tablet (500 mg total) by mouth 2 (two) times daily.     cloBAZam 20 MG tablet  Commonly known as:  ONFI  Take 40 mg by mouth every evening.     clopidogrel 75 MG tablet  Commonly known as:  PLAVIX  Take 75 mg by mouth daily.     Cranberry 450 MG Caps  Take 450 mg by mouth daily.     cyclobenzaprine 5 MG tablet  Commonly known as:  FLEXERIL  Take 5 mg by mouth every 12 (twelve) hours as needed for muscle spasms.     digoxin 0.25 MG tablet   Commonly known as:  LANOXIN  Take 0.25 mg by mouth daily.     finasteride 5 MG tablet  Commonly known as:  PROSCAR  Take 5 mg by mouth daily.     FLORA-Q Caps capsule  Take 1 capsule by mouth daily.     ipratropium-albuterol 0.5-2.5 (3) MG/3ML Soln  Commonly known as:  DUONEB  Take 3 mLs by nebulization 4 (four) times daily.     lamoTRIgine 200 MG tablet  Commonly known as:  LAMICTAL  Take 400 mg by mouth 2 (two) times daily.     levETIRAcetam 500 MG tablet  Commonly known as:  KEPPRA  Take 1,250 mg by mouth daily.     levETIRAcetam 1000 MG tablet  Commonly known as:  KEPPRA  Take 1,000 mg by mouth every evening.     LINZESS 145 MCG Caps capsule  Generic drug:  linaclotide  Take 145 mcg by mouth daily before breakfast.     metoprolol tartrate 25 MG tablet  Commonly known as:  LOPRESSOR  Take 25 mg by mouth 3 (three) times daily.     metroNIDAZOLE 500 MG tablet  Commonly known as:  FLAGYL  Take 1 tablet (500 mg total) by mouth 3 (three) times daily with meals.     montelukast 10 MG tablet  Commonly known as:  SINGULAIR  Take 10 mg by mouth at bedtime.     MULTIVITAMIN PO  Take 1 tablet by mouth daily.     pantoprazole 40 MG tablet  Commonly known as:  PROTONIX  Take 1 tablet (40 mg total) by mouth daily.     PREVIDENT 5000 PLUS 1.1 % Crea dental cream  Generic drug:  sodium fluoride  Place 1 application onto teeth 2 (two) times daily.     sennosides-docusate sodium 8.6-50 MG tablet  Commonly known as:  SENOKOT-S  Take 2 tablets by mouth daily.     tamsulosin 0.4 MG Caps capsule  Commonly known as:  FLOMAX  Take 0.4 mg by mouth daily after supper.       Follow-up Information    Follow up with Primary care provider .     Allergies  Allergen Reactions  . Acetaminophen   . Azithromycin   . Biaxin [Clarithromycin]   . Diltiazem   . Verapamil     Consultations:  General Surgery   Procedures/Studies: US Abdomen Complete  03/30/2016  CLINICAL  DATA:  Abdominal pain EXAM: ABDOMEN ULTRASOUND COMPLETE COMPARISON:  CT  from earlier the same day FINDINGS: Gallbladder: Surgically absent Common bile duct: Diameter: 7 mm. Where visualized, no filling defect. Liver: No focal lesion identified. Within normal limits in parenchymal echogenicity. IVC: No abnormality visualized. Pancreas: Not visualized but without acute finding or mass on previous CT. Spleen: Size and appearance within normal limits. Right Kidney: Length: 10 cm. Cortical thinning with normal echogenicity. No hydronephrosis. Left Kidney: Length: 11 cm. Cortical thinning with normal echogenicity. No hydronephrosis. Abdominal aorta: Obscured at the mid and distal segments. No acute finding on previous CT. No proximal aneurysm. IMPRESSION: 1. No explanation for abdominal pain. 2. Multiple areas are poorly visualized but negative for acute finding on preceding CT. Electronically Signed   By: Marnee Spring M.D.   On: 03/30/2016 04:56   Ct Abdomen Pelvis W Contrast  03/30/2016  CLINICAL DATA:  Abdominal pain.  History of hypertension. EXAM: CT ABDOMEN AND PELVIS WITH CONTRAST TECHNIQUE: Multidetector CT imaging of the abdomen and pelvis was performed using the standard protocol following bolus administration of intravenous contrast. CONTRAST:  1 ISOVUE-300 IOPAMIDOL (ISOVUE-300) INJECTION 61% COMPARISON:  None. FINDINGS: Lower chest and abdominal wall:  Symmetric gynecomastia. Probable previous right inguinal hernia repair. Cardiomegaly and coronary atherosclerosis. Hepatobiliary: No focal liver abnormality.Cholecystectomy with normal common bile duct diameter Pancreas: Unremarkable. Spleen: Unremarkable. Adrenals/Urinary Tract: Mildly lobulated left adrenal gland without discrete nodule. No hydronephrosis or stone. The bladder is decompressed by Foley catheter. Reproductive:Negative Stomach/Bowel: Redundant sigmoid colon with wandering course. The rectum and distal sigmoid has circumferential wall  thickening and mild sigmoid mesenteric inflammatory change. The mesenteric vessels are somewhat distorted but there is no volvulus or obstruction. No small bowel dilatation. No appendicitis. No signs of bowel necrosis or perforation. Vascular/Lymphatic: No acute vascular finding. To level permitted by technique, the IMV and IMA are patent. Intravenous gas at the level of the left groin that is likely from vascular access. No mass or adenopathy. Peritoneal: No ascites or pneumoperitoneum. Musculoskeletal: Sclerotic density in the L5 vertebra is likely a bone island. Remote T11, L1, and L4 superior endplate fractures. IMPRESSION: Rectosigmoid thickening could be ischemic or infectious. Elongated and tortuous sigmoid colon without obstruction or volvulus. No other potential source of sepsis is identified. Incidental findings noted above. Electronically Signed   By: Marnee Spring M.D.   On: 03/30/2016 00:50   Dg Chest Port 1 View  03/30/2016  CLINICAL DATA:  Central catheter placement.  Hypertension. EXAM: PORTABLE CHEST 1 VIEW COMPARISON:  March 29, 2016 FINDINGS: Central catheter tip is in the superior vena cava. No pneumothorax. There is no edema or consolidation. Cardiomegaly is stable. Pulmonary vascularity is normal. Prominence of the thoracic aorta is stable. No adenopathy. No bone lesions evident. IMPRESSION: Central catheter tip in superior vena cava. No pneumothorax. Stable cardiomegaly. No edema or consolidation. Prominence of the thoracic aorta is stable and likely due to chronic hypertensive change. Electronically Signed   By: Bretta Bang III M.D.   On: 03/30/2016 10:51   Dg Chest Port 1 View  03/29/2016  CLINICAL DATA:  Sepsis hypotension EXAM: PORTABLE CHEST 1 VIEW COMPARISON:  None FINDINGS: Severe cardiac silhouette enlargement with azygos vein distension and mild pulmonary vascular congestion. No consolidation or edema appreciated. IMPRESSION: Cardiac enlargement with vascular congestion but  no evidence of edema or consolidation. Electronically Signed   By: Esperanza Heir M.D.   On: 03/29/2016 15:25    Subjective: Pt eating and drinking well.  He had a bowel movement.  His pain is better in the  abdomen.    Discharge Exam: Filed Vitals:   04/03/16 0508 04/03/16 0901  BP: 129/90 136/89  Pulse: 72 88  Temp: 98.3 F (36.8 C) 98.7 F (37.1 C)  Resp: 18 18   Filed Vitals:   04/02/16 2057 04/03/16 0508 04/03/16 0901 04/03/16 0942  BP: 106/78 129/90 136/89   Pulse: 111 72 88   Temp: 98.8 F (37.1 C) 98.3 F (36.8 C) 98.7 F (37.1 C)   TempSrc: Oral Oral Oral   Resp: Height:  (1.93 m)     Weight: 292 lb (132.45 kg)     SpO2: 96% 97% 98% 98%     General exam: Pt appears well nourished sitting up in bed, in no obvious distress. Pt cooperative for exam.   Head, eyes and ENT: pronounced TBI noted; Oral mucosa moist.    Neck: Supple. No JVD, carotid bruit or thyromegaly.  Lymphatics: No lymphadenopathy.  Respiratory system: bilateral upper airway expiratory wheeze heard.  Cardiovascular system: normal s1, s2 sounds.  Gastrointestinal system: Abdomen is soft and nontender, no guarding and Normal bowel sounds heard. No organomegaly or masses appreciated.  Central nervous system: awake and oriented, pronounced left hemiparesis.  Extremities: Peripheral pulses symmetrically felt.   Skin: No rashes or acute findings.  Psychiatry: Pleasant and cooperative   The results of significant diagnostics from this hospitalization (including imaging, microbiology, ancillary and laboratory) are listed below for reference.     Microbiology: Recent Results (from the past 240 hour(s))  Culture, blood (Routine x 2)     Status: None (Preliminary result)   Collection Time: 03/29/16  5:55 PM  Result Value Ref Range Status   Specimen Description BLOOD RIGHT HAND  Final   Special Requests IN PEDIATRIC BOTTLE 1CC  Final   Culture NO GROWTH 4 DAYS  Final    Report Status PENDING  Incomplete  Urine culture     Status: Abnormal   Collection Time: 03/29/16  8:12 PM  Result Value Ref Range Status   Specimen Description URINE, CLEAN CATCH  Final   Special Requests NONE  Final   Culture 3,000 COLONIES/mL INSIGNIFICANT GROWTH (A)  Final   Report Status 03/30/2016 FINAL  Final  MRSA PCR Screening     Status: Abnormal   Collection Time: 03/29/16  9:46 PM  Result Value Ref Range Status   MRSA by PCR POSITIVE (A) NEGATIVE Final    Comment: RESULT CALLED TO, READ BACK BY AND VERIFIED WITH: RN B REAT AT 2300 [id number] MARTINB        The GeneXpert MRSA Assay (FDA approved for NASAL specimens only), is one component of a comprehensive MRSA colonization surveillance program. It is not intended to diagnose MRSA infection nor to guide or monitor treatment for MRSA infections.   Gastrointestinal Panel by PCR , Stool     Status: None   Collection Time: 03/30/16  6:32 AM  Result Value Ref Range Status   Campylobacter species NOT DETECTED NOT DETECTED Final   Plesimonas shigelloides NOT DETECTED NOT DETECTED Final   Salmonella species NOT DETECTED NOT DETECTED Final   Yersinia enterocolitica NOT DETECTED NOT DETECTED Final   Vibrio species NOT DETECTED NOT DETECTED Final   Vibrio cholerae NOT DETECTED NOT DETECTED Final   Enteroaggregative E coli (EAEC) NOT DETECTED NOT DETECTED Final   Enteropathogenic E coli (EPEC) NOT DETECTED NOT DETECTED Final   Enterotoxigenic E coli (ETEC) NOT DETECTED NOT DETECTED Final   Shiga like toxin producing E  coli (STEC) NOT DETECTED NOT DETECTED Final   E. coli O157 NOT DETECTED NOT DETECTED Final   Shigella/Enteroinvasive E coli (EIEC) NOT DETECTED NOT DETECTED Final   Cryptosporidium NOT DETECTED NOT DETECTED Final   Cyclospora cayetanensis NOT DETECTED NOT DETECTED Final   Entamoeba histolytica NOT DETECTED NOT DETECTED Final   Giardia lamblia NOT DETECTED NOT DETECTED Final   Adenovirus F40/41 NOT DETECTED NOT  DETECTED Final   Astrovirus NOT DETECTED NOT DETECTED Final   Norovirus GI/GII NOT DETECTED NOT DETECTED Final   Rotavirus A NOT DETECTED NOT DETECTED Final   Sapovirus (I, II, IV, and V) NOT DETECTED NOT DETECTED Final  C difficile quick scan w PCR reflex     Status: None   Collection Time: 03/30/16  6:32 AM  Result Value Ref Range Status   C Diff antigen NEGATIVE NEGATIVE Final   C Diff toxin NEGATIVE NEGATIVE Final   C Diff interpretation Negative for toxigenic C. difficile  Final     Labs: BNP (last 3 results) No results for input(s): BNP in the last 8760 hours. Basic Metabolic Panel:  Recent Labs Lab 03/29/16 1415 03/30/16 0949 03/31/16 0329 04/02/16 0530  NA 139 141 138 140  K 4.4 3.5 3.5 3.4*  CL 104 111 109 108  CO2 26 24 23 27   GLUCOSE 152* 110* 90 79  BUN 12 14 12  5*  CREATININE 1.32* 1.02 0.98 0.84  CALCIUM 10.2 8.4* 8.2* 8.4*   Liver Function Tests:  Recent Labs Lab 03/29/16 1415 03/30/16 0949  AST 31 27  ALT 25 20  ALKPHOS 116 73  BILITOT 0.5 0.9  PROT 8.2* 6.0*  ALBUMIN 3.6 2.6*   No results for input(s): LIPASE, AMYLASE in the last 168 hours. No results for input(s): AMMONIA in the last 168 hours. CBC:  Recent Labs Lab 03/29/16 1415 03/30/16 0949 03/31/16 0329  WBC 14.9* 10.6* 9.1  NEUTROABS 11.7* 8.5*  --   HGB 13.4 10.9* 10.6*  HCT 41.8 34.5* 33.9*  MCV 93.9 93.5 95.2  PLT 287 232 218   Cardiac Enzymes: No results for input(s): CKTOTAL, CKMB, CKMBINDEX, TROPONINI in the last 168 hours. BNP: Invalid input(s): POCBNP CBG:  Recent Labs Lab 03/30/16 2003 04/02/16 1208 04/02/16 1555 04/02/16 2055 04/03/16 0742  GLUCAP 147* 94 104* 113* 86   D-Dimer No results for input(s): DDIMER in the last 72 hours. Hgb A1c  Recent Labs  03/31/16 1030  HGBA1C 5.1   Lipid Profile No results for input(s): CHOL, HDL, LDLCALC, TRIG, CHOLHDL, LDLDIRECT in the last 72 hours. Thyroid function studies No results for input(s): TSH, T4TOTAL,  T3FREE, THYROIDAB in the last 72 hours.  Invalid input(s): FREET3 Anemia work up No results for input(s): VITAMINB12, FOLATE, FERRITIN, TIBC, IRON, RETICCTPCT in the last 72 hours. Urinalysis    Component Value Date/Time   COLORURINE AMBER* 03/29/2016 2011   APPEARANCEUR CLOUDY* 03/29/2016 2011   LABSPEC 1.025 03/29/2016 2011   PHURINE 5.0 03/29/2016 2011   GLUCOSEU NEGATIVE 03/29/2016 2011   HGBUR NEGATIVE 03/29/2016 2011   BILIRUBINUR SMALL* 03/29/2016 2011   KETONESUR 15* 03/29/2016 2011   PROTEINUR NEGATIVE 03/29/2016 2011   NITRITE NEGATIVE 03/29/2016 2011   LEUKOCYTESUR TRACE* 03/29/2016 2011   Sepsis Labs Invalid input(s): PROCALCITONIN,  WBC,  LACTICIDVEN Microbiology Recent Results (from the past 240 hour(s))  Culture, blood (Routine x 2)     Status: None (Preliminary result)   Collection Time: 03/29/16  5:55 PM  Result Value Ref Range Status  Specimen Description BLOOD RIGHT HAND  Final   Special Requests IN PEDIATRIC BOTTLE 1CC  Final   Culture NO GROWTH 4 DAYS  Final   Report Status PENDING  Incomplete  Urine culture     Status: Abnormal   Collection Time: 03/29/16  8:12 PM  Result Value Ref Range Status   Specimen Description URINE, CLEAN CATCH  Final   Special Requests NONE  Final   Culture 3,000 COLONIES/mL INSIGNIFICANT GROWTH (A)  Final   Report Status 03/30/2016 FINAL  Final  MRSA PCR Screening     Status: Abnormal   Collection Time: 03/29/16  9:46 PM  Result Value Ref Range Status   MRSA by PCR POSITIVE (A) NEGATIVE Final    Comment: RESULT CALLED TO, READ BACK BY AND VERIFIED WITH: RN B REAT AT 2300 [id number] MARTINB        The GeneXpert MRSA Assay (FDA approved for NASAL specimens only), is one component of a comprehensive MRSA colonization surveillance program. It is not intended to diagnose MRSA infection nor to guide or monitor treatment for MRSA infections.   Gastrointestinal Panel by PCR , Stool     Status: None   Collection Time:  03/30/16  6:32 AM  Result Value Ref Range Status   Campylobacter species NOT DETECTED NOT DETECTED Final   Plesimonas shigelloides NOT DETECTED NOT DETECTED Final   Salmonella species NOT DETECTED NOT DETECTED Final   Yersinia enterocolitica NOT DETECTED NOT DETECTED Final   Vibrio species NOT DETECTED NOT DETECTED Final   Vibrio cholerae NOT DETECTED NOT DETECTED Final   Enteroaggregative E coli (EAEC) NOT DETECTED NOT DETECTED Final   Enteropathogenic E coli (EPEC) NOT DETECTED NOT DETECTED Final   Enterotoxigenic E coli (ETEC) NOT DETECTED NOT DETECTED Final   Shiga like toxin producing E coli (STEC) NOT DETECTED NOT DETECTED Final   E. coli O157 NOT DETECTED NOT DETECTED Final   Shigella/Enteroinvasive E coli (EIEC) NOT DETECTED NOT DETECTED Final   Cryptosporidium NOT DETECTED NOT DETECTED Final   Cyclospora cayetanensis NOT DETECTED NOT DETECTED Final   Entamoeba histolytica NOT DETECTED NOT DETECTED Final   Giardia lamblia NOT DETECTED NOT DETECTED Final   Adenovirus F40/41 NOT DETECTED NOT DETECTED Final   Astrovirus NOT DETECTED NOT DETECTED Final   Norovirus GI/GII NOT DETECTED NOT DETECTED Final   Rotavirus A NOT DETECTED NOT DETECTED Final   Sapovirus (I, II, IV, and V) NOT DETECTED NOT DETECTED Final  C difficile quick scan w PCR reflex     Status: None   Collection Time: 03/30/16  6:32 AM  Result Value Ref Range Status   C Diff antigen NEGATIVE NEGATIVE Final   C Diff toxin NEGATIVE NEGATIVE Final   C Diff interpretation Negative for toxigenic C. difficile  Final   Time coordinating discharge: 40 minutes  SIGNED:  Standley Dakins, MD  [facility] 04/03/2016, 9:47 AM Pager   If 7PM-7AM, please contact night-coverage [email or web address] Password [email or web address]

## 2016-04-03 NOTE — Progress Notes (Signed)
04/03/2016 3:10 PM  IV RN removed PICC line, this RN removed telemetry.  Called report to Ander SladeJoy, [facility].  SW called [facility] for transport shortly after. Sister, Joyce GrossKay, updated at bedside.  Theadora RamaKIRKMAN, Cimone Fahey Brooke

## 2016-04-03 NOTE — Care Management Note (Signed)
Case Management Note  Patient Details  Name: Austin Hartman MRN: [id number] Date of Birth: 08/13/1951  Subjective/Objective:            CM following for progression and d/c planning.       Action/Plan: 04/03/2016 Met with pt and sister on 04/02/2016 , IM given and discussed, noted plan to d/c today back to SNF. No HH or DME needs at this time.   Expected Discharge Date:    04/03/2016           Expected Discharge Plan:  Skilled Nursing Facility  In-House Referral:  Clinical Social Work  Discharge planning Services  CM Consult  Post Acute Care Choice:  NA Choice offered to:  NA  DME Arranged:  N/A DME Agency:  NA  HH Arranged:  NA HH Agency:  NA  Status of Service:  Completed, signed off  Medicare Important Message Given:  Yes Date Medicare IM Given:    Medicare IM give by:    Date Additional Medicare IM Given:    Additional Medicare Important Message give by:     If discussed at [place] of Stay Meetings, dates discussed:    Additional Comments:  Adron Bene, RN 04/03/2016, 12:48 PM

## 2016-05-07 DIAGNOSIS — I483 Typical atrial flutter: Secondary | ICD-10-CM | POA: Insufficient documentation

## 2016-05-07 HISTORY — DX: Typical atrial flutter: I48.3

## 2016-07-26 DIAGNOSIS — N400 Enlarged prostate without lower urinary tract symptoms: Secondary | ICD-10-CM

## 2016-07-26 DIAGNOSIS — I69319 Unspecified symptoms and signs involving cognitive functions following cerebral infarction: Secondary | ICD-10-CM

## 2016-07-26 DIAGNOSIS — N390 Urinary tract infection, site not specified: Secondary | ICD-10-CM | POA: Diagnosis not present

## 2016-07-26 DIAGNOSIS — E871 Hypo-osmolality and hyponatremia: Secondary | ICD-10-CM

## 2016-07-26 DIAGNOSIS — K219 Gastro-esophageal reflux disease without esophagitis: Secondary | ICD-10-CM | POA: Diagnosis not present

## 2016-07-26 DIAGNOSIS — A419 Sepsis, unspecified organism: Secondary | ICD-10-CM | POA: Diagnosis not present

## 2016-07-27 DIAGNOSIS — N400 Enlarged prostate without lower urinary tract symptoms: Secondary | ICD-10-CM | POA: Diagnosis not present

## 2016-07-27 DIAGNOSIS — A419 Sepsis, unspecified organism: Secondary | ICD-10-CM | POA: Diagnosis not present

## 2016-07-27 DIAGNOSIS — K219 Gastro-esophageal reflux disease without esophagitis: Secondary | ICD-10-CM | POA: Diagnosis not present

## 2016-07-27 DIAGNOSIS — N390 Urinary tract infection, site not specified: Secondary | ICD-10-CM | POA: Diagnosis not present

## 2016-07-28 DIAGNOSIS — N390 Urinary tract infection, site not specified: Secondary | ICD-10-CM | POA: Diagnosis not present

## 2016-07-28 DIAGNOSIS — N400 Enlarged prostate without lower urinary tract symptoms: Secondary | ICD-10-CM | POA: Diagnosis not present

## 2016-07-28 DIAGNOSIS — A419 Sepsis, unspecified organism: Secondary | ICD-10-CM | POA: Diagnosis not present

## 2016-07-28 DIAGNOSIS — K219 Gastro-esophageal reflux disease without esophagitis: Secondary | ICD-10-CM | POA: Diagnosis not present

## 2016-07-29 DIAGNOSIS — N400 Enlarged prostate without lower urinary tract symptoms: Secondary | ICD-10-CM | POA: Diagnosis not present

## 2016-07-29 DIAGNOSIS — A419 Sepsis, unspecified organism: Secondary | ICD-10-CM | POA: Diagnosis not present

## 2016-07-29 DIAGNOSIS — N390 Urinary tract infection, site not specified: Secondary | ICD-10-CM | POA: Diagnosis not present

## 2016-07-29 DIAGNOSIS — K219 Gastro-esophageal reflux disease without esophagitis: Secondary | ICD-10-CM | POA: Diagnosis not present

## 2016-07-30 DIAGNOSIS — A419 Sepsis, unspecified organism: Secondary | ICD-10-CM | POA: Diagnosis not present

## 2016-07-30 DIAGNOSIS — N390 Urinary tract infection, site not specified: Secondary | ICD-10-CM | POA: Diagnosis not present

## 2016-07-30 DIAGNOSIS — N400 Enlarged prostate without lower urinary tract symptoms: Secondary | ICD-10-CM | POA: Diagnosis not present

## 2016-07-30 DIAGNOSIS — K219 Gastro-esophageal reflux disease without esophagitis: Secondary | ICD-10-CM | POA: Diagnosis not present

## 2016-12-20 ENCOUNTER — Ambulatory Visit: Payer: Medicare Other | Admitting: Neurology

## 2016-12-25 ENCOUNTER — Ambulatory Visit: Payer: Medicare Other | Admitting: Neurology

## 2017-02-21 ENCOUNTER — Encounter: Payer: Self-pay | Admitting: Neurology

## 2017-02-21 ENCOUNTER — Ambulatory Visit (INDEPENDENT_AMBULATORY_CARE_PROVIDER_SITE_OTHER): Payer: Medicare Other | Admitting: Neurology

## 2017-02-21 VITALS — HR 107 | Temp 97.9°F | Ht 76.0 in | Wt 273.0 lb

## 2017-02-21 DIAGNOSIS — Z87820 Personal history of traumatic brain injury: Secondary | ICD-10-CM

## 2017-02-21 DIAGNOSIS — G40119 Localization-related (focal) (partial) symptomatic epilepsy and epileptic syndromes with simple partial seizures, intractable, without status epilepticus: Secondary | ICD-10-CM
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
## 2017-03-01 DIAGNOSIS — G40119 Localization-related (focal) (partial) symptomatic epilepsy and epileptic syndromes with simple partial seizures, intractable, without status epilepticus: Secondary | ICD-10-CM | POA: Insufficient documentation

## 2017-03-01 HISTORY — DX: Localization-related (focal) (partial) symptomatic epilepsy and epileptic syndromes with simple partial seizures, intractable, without status epilepticus: G40.119

## 2017-04-12 DIAGNOSIS — J189 Pneumonia, unspecified organism: Secondary | ICD-10-CM

## 2017-04-12 DIAGNOSIS — R569 Unspecified convulsions: Secondary | ICD-10-CM | POA: Diagnosis not present

## 2017-04-12 DIAGNOSIS — I63319 Cerebral infarction due to thrombosis of unspecified middle cerebral artery: Secondary | ICD-10-CM | POA: Diagnosis not present

## 2017-04-12 DIAGNOSIS — A419 Sepsis, unspecified organism: Secondary | ICD-10-CM | POA: Diagnosis not present

## 2017-04-12 DIAGNOSIS — K219 Gastro-esophageal reflux disease without esophagitis: Secondary | ICD-10-CM | POA: Diagnosis not present

## 2017-04-12 DIAGNOSIS — N400 Enlarged prostate without lower urinary tract symptoms: Secondary | ICD-10-CM

## 2017-04-12 DIAGNOSIS — I2510 Atherosclerotic heart disease of native coronary artery without angina pectoris: Secondary | ICD-10-CM | POA: Diagnosis not present

## 2017-04-12 DIAGNOSIS — E785 Hyperlipidemia, unspecified: Secondary | ICD-10-CM | POA: Diagnosis not present

## 2017-04-12 DIAGNOSIS — R600 Localized edema: Secondary | ICD-10-CM | POA: Diagnosis not present

## 2017-04-13 DIAGNOSIS — I2510 Atherosclerotic heart disease of native coronary artery without angina pectoris: Secondary | ICD-10-CM | POA: Diagnosis not present

## 2017-04-13 DIAGNOSIS — A419 Sepsis, unspecified organism: Secondary | ICD-10-CM | POA: Diagnosis not present

## 2017-04-13 DIAGNOSIS — N400 Enlarged prostate without lower urinary tract symptoms: Secondary | ICD-10-CM | POA: Diagnosis not present

## 2017-04-13 DIAGNOSIS — R569 Unspecified convulsions: Secondary | ICD-10-CM | POA: Diagnosis not present

## 2017-04-13 DIAGNOSIS — J189 Pneumonia, unspecified organism: Secondary | ICD-10-CM | POA: Diagnosis not present

## 2017-04-13 DIAGNOSIS — E785 Hyperlipidemia, unspecified: Secondary | ICD-10-CM | POA: Diagnosis not present

## 2017-04-13 DIAGNOSIS — R600 Localized edema: Secondary | ICD-10-CM | POA: Diagnosis not present

## 2017-04-13 DIAGNOSIS — I63319 Cerebral infarction due to thrombosis of unspecified middle cerebral artery: Secondary | ICD-10-CM | POA: Diagnosis not present

## 2017-04-13 DIAGNOSIS — K219 Gastro-esophageal reflux disease without esophagitis: Secondary | ICD-10-CM | POA: Diagnosis not present

## 2017-04-14 DIAGNOSIS — I2510 Atherosclerotic heart disease of native coronary artery without angina pectoris: Secondary | ICD-10-CM | POA: Diagnosis not present

## 2017-04-14 DIAGNOSIS — R569 Unspecified convulsions: Secondary | ICD-10-CM | POA: Diagnosis not present

## 2017-04-14 DIAGNOSIS — K219 Gastro-esophageal reflux disease without esophagitis: Secondary | ICD-10-CM | POA: Diagnosis not present

## 2017-04-14 DIAGNOSIS — J189 Pneumonia, unspecified organism: Secondary | ICD-10-CM | POA: Diagnosis not present

## 2017-04-14 DIAGNOSIS — A419 Sepsis, unspecified organism: Secondary | ICD-10-CM | POA: Diagnosis not present

## 2017-04-14 DIAGNOSIS — N400 Enlarged prostate without lower urinary tract symptoms: Secondary | ICD-10-CM | POA: Diagnosis not present

## 2017-04-14 DIAGNOSIS — I63319 Cerebral infarction due to thrombosis of unspecified middle cerebral artery: Secondary | ICD-10-CM | POA: Diagnosis not present

## 2017-04-14 DIAGNOSIS — R600 Localized edema: Secondary | ICD-10-CM | POA: Diagnosis not present

## 2017-04-14 DIAGNOSIS — E785 Hyperlipidemia, unspecified: Secondary | ICD-10-CM | POA: Diagnosis not present

## 2017-04-15 DIAGNOSIS — J189 Pneumonia, unspecified organism: Secondary | ICD-10-CM | POA: Diagnosis not present

## 2017-04-15 DIAGNOSIS — K219 Gastro-esophageal reflux disease without esophagitis: Secondary | ICD-10-CM | POA: Diagnosis not present

## 2017-04-15 DIAGNOSIS — E785 Hyperlipidemia, unspecified: Secondary | ICD-10-CM | POA: Diagnosis not present

## 2017-04-15 DIAGNOSIS — I2510 Atherosclerotic heart disease of native coronary artery without angina pectoris: Secondary | ICD-10-CM | POA: Diagnosis not present

## 2017-04-15 DIAGNOSIS — N390 Urinary tract infection, site not specified: Secondary | ICD-10-CM | POA: Diagnosis not present

## 2017-04-15 DIAGNOSIS — R569 Unspecified convulsions: Secondary | ICD-10-CM | POA: Diagnosis not present

## 2017-04-15 DIAGNOSIS — N400 Enlarged prostate without lower urinary tract symptoms: Secondary | ICD-10-CM | POA: Diagnosis not present

## 2017-04-15 DIAGNOSIS — A419 Sepsis, unspecified organism: Secondary | ICD-10-CM | POA: Diagnosis not present

## 2017-04-15 DIAGNOSIS — R600 Localized edema: Secondary | ICD-10-CM | POA: Diagnosis not present

## 2017-04-16 DIAGNOSIS — I2510 Atherosclerotic heart disease of native coronary artery without angina pectoris: Secondary | ICD-10-CM | POA: Diagnosis not present

## 2017-04-16 DIAGNOSIS — N400 Enlarged prostate without lower urinary tract symptoms: Secondary | ICD-10-CM | POA: Diagnosis not present

## 2017-04-16 DIAGNOSIS — E785 Hyperlipidemia, unspecified: Secondary | ICD-10-CM | POA: Diagnosis not present

## 2017-04-16 DIAGNOSIS — R600 Localized edema: Secondary | ICD-10-CM | POA: Diagnosis not present

## 2017-04-16 DIAGNOSIS — J189 Pneumonia, unspecified organism: Secondary | ICD-10-CM | POA: Diagnosis not present

## 2017-04-16 DIAGNOSIS — A419 Sepsis, unspecified organism: Secondary | ICD-10-CM | POA: Diagnosis not present

## 2017-04-16 DIAGNOSIS — R569 Unspecified convulsions: Secondary | ICD-10-CM | POA: Diagnosis not present

## 2017-04-16 DIAGNOSIS — I63319 Cerebral infarction due to thrombosis of unspecified middle cerebral artery: Secondary | ICD-10-CM | POA: Diagnosis not present

## 2017-04-16 DIAGNOSIS — N390 Urinary tract infection, site not specified: Secondary | ICD-10-CM | POA: Diagnosis not present

## 2017-04-16 DIAGNOSIS — K219 Gastro-esophageal reflux disease without esophagitis: Secondary | ICD-10-CM | POA: Diagnosis not present

## 2017-04-17 DIAGNOSIS — E785 Hyperlipidemia, unspecified: Secondary | ICD-10-CM | POA: Diagnosis not present

## 2017-04-17 DIAGNOSIS — I63319 Cerebral infarction due to thrombosis of unspecified middle cerebral artery: Secondary | ICD-10-CM | POA: Diagnosis not present

## 2017-04-17 DIAGNOSIS — K219 Gastro-esophageal reflux disease without esophagitis: Secondary | ICD-10-CM | POA: Diagnosis not present

## 2017-04-17 DIAGNOSIS — J189 Pneumonia, unspecified organism: Secondary | ICD-10-CM | POA: Diagnosis not present

## 2017-04-17 DIAGNOSIS — R569 Unspecified convulsions: Secondary | ICD-10-CM | POA: Diagnosis not present

## 2017-04-17 DIAGNOSIS — A419 Sepsis, unspecified organism: Secondary | ICD-10-CM | POA: Diagnosis not present

## 2017-04-17 DIAGNOSIS — N390 Urinary tract infection, site not specified: Secondary | ICD-10-CM | POA: Diagnosis not present

## 2017-04-17 DIAGNOSIS — N400 Enlarged prostate without lower urinary tract symptoms: Secondary | ICD-10-CM | POA: Diagnosis not present

## 2017-04-17 DIAGNOSIS — R600 Localized edema: Secondary | ICD-10-CM | POA: Diagnosis not present

## 2017-04-17 DIAGNOSIS — I2510 Atherosclerotic heart disease of native coronary artery without angina pectoris: Secondary | ICD-10-CM | POA: Diagnosis not present

## 2017-04-18 DIAGNOSIS — R569 Unspecified convulsions: Secondary | ICD-10-CM | POA: Diagnosis not present

## 2017-04-18 DIAGNOSIS — E785 Hyperlipidemia, unspecified: Secondary | ICD-10-CM | POA: Diagnosis not present

## 2017-04-18 DIAGNOSIS — N400 Enlarged prostate without lower urinary tract symptoms: Secondary | ICD-10-CM | POA: Diagnosis not present

## 2017-04-18 DIAGNOSIS — R600 Localized edema: Secondary | ICD-10-CM | POA: Diagnosis not present

## 2017-04-18 DIAGNOSIS — I2510 Atherosclerotic heart disease of native coronary artery without angina pectoris: Secondary | ICD-10-CM | POA: Diagnosis not present

## 2017-04-18 DIAGNOSIS — J189 Pneumonia, unspecified organism: Secondary | ICD-10-CM | POA: Diagnosis not present

## 2017-04-18 DIAGNOSIS — N390 Urinary tract infection, site not specified: Secondary | ICD-10-CM | POA: Diagnosis not present

## 2017-04-18 DIAGNOSIS — K219 Gastro-esophageal reflux disease without esophagitis: Secondary | ICD-10-CM | POA: Diagnosis not present

## 2017-04-18 DIAGNOSIS — I63319 Cerebral infarction due to thrombosis of unspecified middle cerebral artery: Secondary | ICD-10-CM | POA: Diagnosis not present

## 2017-04-18 DIAGNOSIS — A419 Sepsis, unspecified organism: Secondary | ICD-10-CM | POA: Diagnosis not present

## 2017-06-06 ENCOUNTER — Ambulatory Visit (INDEPENDENT_AMBULATORY_CARE_PROVIDER_SITE_OTHER): Payer: Medicare Other | Admitting: Cardiology

## 2017-06-06 ENCOUNTER — Encounter: Payer: Self-pay | Admitting: Cardiology

## 2017-06-06 VITALS — BP 104/74 | HR 89 | Ht 76.0 in | Wt 280.0 lb

## 2017-06-06 DIAGNOSIS — Z79899 Other long term (current) drug therapy: Secondary | ICD-10-CM | POA: Diagnosis not present

## 2017-06-06 DIAGNOSIS — I4821 Permanent atrial fibrillation: Secondary | ICD-10-CM

## 2017-06-06 DIAGNOSIS — I482 Chronic atrial fibrillation, unspecified: Secondary | ICD-10-CM | POA: Insufficient documentation

## 2017-06-06 DIAGNOSIS — I6203 Nontraumatic chronic subdural hemorrhage: Secondary | ICD-10-CM

## 2017-06-06 DIAGNOSIS — I25118 Atherosclerotic heart disease of native coronary artery with other forms of angina pectoris: Secondary | ICD-10-CM

## 2017-06-06 DIAGNOSIS — I10 Essential (primary) hypertension: Secondary | ICD-10-CM | POA: Diagnosis not present

## 2017-06-06 DIAGNOSIS — E785 Hyperlipidemia, unspecified: Secondary | ICD-10-CM | POA: Diagnosis not present

## 2017-06-06 NOTE — Patient Instructions (Addendum)

## 2017-06-06 NOTE — Progress Notes (Signed)
Cardiology Office Note:    Date:  06/06/2017   ID:  Austin Hartman, DOB [birth year], MRN [id number]  PCP:  Shelbie Ammons, MD  Cardiologist:  Norman Herrlich, MD    Referring MD: Shelbie Ammons, MD  Please do a digoxin level CMP and lipid   ASSESSMENT:    1. Coronary artery disease of native artery of native heart with stable angina pectoris (HCC)   2. Permanent atrial fibrillation (HCC)   3. Hyperlipidemia, unspecified hyperlipidemia type   4. High risk medication use   5. Essential hypertension   6. Subdural hematoma, chronic (HCC)    PLAN:    In order of problems listed above:  1. Stable, with comorbidities and poor candidate for further revascularization so that I wouldn't do a stress test, continue current medical treatment with dual antiplatelet therapy beta blocker and high intensity statin 2. Stable rate is controlled continue current treatment including digoxin low-dose amiodarone for rate control and beta blocker 3. Stable continue his statin weight labs regarding potential for liver toxicity and LDL for efficacy Digoxin, no signs of clinical toxicity weight labs for drug level ideally less than 1 to avoid increased mortality with atrial fibrillation. He is on dual antiplatelet therapy for stroke prophylaxis  4.           Stable continue current treatment 5.           Stable, he's poor candidate for anticoagulation  Next appointment: 6 months   Medication Adjustments/Labs and Tests Ordered: Current medicines are reviewed at length with the patient today.  Concerns regarding medicines are outlined above.  Orders Placed This Encounter  Procedures  . EKG 12-Lead   No orders of the defined types were placed in this encounter.   Chief Complaint  Patient presents with  . Follow-up    6 month flup     History of Present Illness:    Austin Hartman is a 66 y.o. male with a hx of CAD with previous PCI,  chronic Atrial Fibrillation on DAPT, Dyslipidemia, and HTN last seen  six months ago.His course has been stable no recurrent hospitalizations chest pain dyspnea palpitation or syncope and the patient and his family are pleased with his quality of care. He has frequent labs performed copy is requested Compliance with diet, lifestyle and medications: Yes Past Medical History:  Diagnosis Date  . Abdominal pain, generalized 03/29/2016  . Altered mental state 03/29/2016  . At high risk for aspiration 03/29/2016  . CHF (congestive heart failure) (HCC)   . Chronic constipation 03/29/2016  . Coronary artery disease   . Diarrhea   . DNR (do not resuscitate) 03/29/2016  . Dysphagia   . Epilepsy (HCC) 03/29/2016  . Essential hypertension   . Hemiplegia (HCC) 12/13/2011  . History of traumatic brain injury   . Hyperlipidemia 06/30/2015  . Hypertension   . Hypotension, unspecified 03/29/2016  . Hypothermia 03/29/2016  . Left hemiparesis (HCC) 03/29/2016  . Leukocytosis 03/29/2016  . 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 Partial epilepsy with impairment of consciousness, intractable (HCC) 12/13/2011  . Sepsis (HCC)   . Stroke (HCC)   . TBI (traumatic brain injury) (HCC)   . Typical atrial flutter (HCC) 05/07/2016    Past Surgical History:  Procedure Laterality Date  . BRAIN SURGERY    . CARDIAC SURGERY    . CHOLECYSTECTOMY    . CORONARY ANGIOPLASTY WITH STENT PLACEMENT    .  HERNIA REPAIR      Current Medications: Current Meds  Medication Sig  . amiodarone (PACERONE) 200 MG tablet Take 200 mg by mouth 2 (two) times daily.  Marland Kitchen aspirin EC 81 MG tablet Take 81 mg by mouth daily.  Marland Kitchen atorvastatin (LIPITOR) 20 MG tablet Take 20 mg by mouth daily.  . bethanechol (URECHOLINE) 25 MG tablet Take 25 mg by mouth 3 (three) times daily.  . cloBAZam (ONFI) 20 MG tablet Take 40 mg by mouth every evening.  . clopidogrel (PLAVIX) 75 MG tablet Take 75 mg by mouth  daily.  . Cranberry 450 MG CAPS Take 450 mg by mouth daily.  . cyclobenzaprine (FLEXERIL) 5 MG tablet Take 5 mg by mouth every 12 (twelve) hours as needed for muscle spasms.  . digoxin (LANOXIN) 0.25 MG tablet Take 0.25 mg by mouth daily.  . finasteride (PROSCAR) 5 MG tablet Take 5 mg by mouth daily.  . furosemide (LASIX) 40 MG tablet Take 40 mg by mouth daily.  Marland Kitchen ipratropium-albuterol (DUONEB) 0.5-2.5 (3) MG/3ML SOLN Take 3 mLs by nebulization 4 (four) times daily.  Marland Kitchen lamoTRIgine (LAMICTAL) 200 MG tablet Take 400 mg by mouth 2 (two) times daily.  Marland Kitchen levETIRAcetam (KEPPRA) 1000 MG tablet Take 1,000 mg by mouth 2 (two) times daily.   Marland Kitchen levETIRAcetam (KEPPRA) 500 MG tablet Take 250 mg by mouth daily.   Marland Kitchen linaclotide (LINZESS) 145 MCG CAPS capsule Take 145 mcg by mouth daily before breakfast.  . loratadine (CLARITIN) 10 MG tablet Take 10 mg by mouth daily as needed for allergies.  . metoprolol tartrate (LOPRESSOR) 25 MG tablet Take 12.5 mg by mouth 3 (three) times daily.   . montelukast (SINGULAIR) 10 MG tablet Take 10 mg by mouth at bedtime.  . Multiple Vitamins-Minerals (MULTIVITAMIN PO) Take 1 tablet by mouth daily.  . pantoprazole (PROTONIX) 40 MG tablet Take 1 tablet (40 mg total) by mouth daily.  . sennosides-docusate sodium (SENOKOT-S) 8.6-50 MG tablet Take 2 tablets by mouth daily.  . sodium fluoride (PREVIDENT 5000 PLUS) 1.1 % CREA dental cream Place 1 application onto teeth 2 (two) times daily.  . tamsulosin (FLOMAX) 0.4 MG CAPS capsule Take 0.4 mg by mouth daily after supper.     Allergies:   Acetaminophen; Azithromycin; Biaxin [clarithromycin]; Diltiazem; and Verapamil   Social History   Social History  . Marital status: Single    Spouse name: N/A  . Number of children: N/A  . Years of education: N/A   Social History Main Topics  . Smoking status: Never Smoker  . Smokeless tobacco: Never Used  . Alcohol use No  . Drug use: No  . Sexual activity: Not Asked   Other Topics  Concern  . None   Social History Narrative  . None     Family History: The patient's family history includes Heart attack in his father; Hyperlipidemia in his father; Hypertension in his father and mother; Lung cancer in his mother. ROS:   Please see the history of present illness.    All other systems reviewed and are negative.  EKGs/Labs/Other Studies Reviewed:    The following studies were reviewed today:  EKG:  EKG ordered today.  The ekg ordered today demonstrates AF with a controlled VR  Recent Labs: No results found for requested labs within last 8760 hours.  Recent Lipid Panel No results found for: CHOL, TRIG, HDL, CHOLHDL, VLDL, LDLCALC, LDLDIRECT  Physical Exam:    VS:  BP 104/74 (BP Location: Right Arm, Patient  Position: Sitting)   Pulse 89   Ht 6\' 4"  (1.93 m)   Wt 280 lb (127 kg)   SpO2 97%   BMI 34.08 kg/m     Wt Readings from Last 3 Encounters:  06/06/17 280 lb (127 kg)  02/21/17 273 lb (123.8 kg)  04/02/16 292 lb (132.5 kg)     GEN:  Well nourished, well developed in no acute distress HEENT: Normal NECK: No JVD; No carotid bruits LYMPHATICS: No lymphadenopathy CARDIAC: Irr irrRRR, no murmurs, rubs, gallops RESPIRATORY:  Clear to auscultation without rales, wheezing or rhonchi  ABDOMEN: Soft, non-tender, non-distended MUSCULOSKELETAL:  No edema; No deformity  SKIN: Warm and dry NEUROLOGIC:  Alert and oriented x 3 PSYCHIATRIC:  Normal affect    Signed, Norman HerrlichBrian Piercen Covino, MD  06/06/2017 1:06 PM    [facility]

## 2017-08-08 ENCOUNTER — Telehealth: Payer: Self-pay | Admitting: Neurology

## 2017-08-08 NOTE — Telephone Encounter (Signed)
Returned call.  LMOM letting pt/pt's sister know that xarelto should not cause any issues with pt's medications, however they should contact pt's cardiologist/PCP for further questions.

## 2017-08-08 NOTE — Telephone Encounter (Signed)
Pt called and is questioning a medication called xarelto, and wants to know if its ok to take to prevent blood clots

## 2017-08-26 ENCOUNTER — Encounter: Payer: Self-pay | Admitting: Neurology

## 2017-08-26 ENCOUNTER — Ambulatory Visit (INDEPENDENT_AMBULATORY_CARE_PROVIDER_SITE_OTHER): Payer: Medicare Other | Admitting: Neurology

## 2017-08-26 VITALS — BP 132/78 | HR 92 | Ht 76.0 in | Wt 278.0 lb

## 2017-08-26 DIAGNOSIS — Z87820 Personal history of traumatic brain injury: Secondary | ICD-10-CM | POA: Diagnosis not present

## 2017-08-26 DIAGNOSIS — I25118 Atherosclerotic heart disease of native coronary artery with other forms of angina pectoris: Secondary | ICD-10-CM | POA: Diagnosis not present

## 2017-08-26 DIAGNOSIS — G40119 Localization-related (focal) (partial) symptomatic epilepsy and epileptic syndromes with simple partial seizures, intractable, without status epilepticus: Secondary | ICD-10-CM
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
## 2017-08-26 NOTE — Patient Instructions (Signed)
It's been great seeing you! Continue all your medications. Have a good holiday and see you in 6 months. Call for any changes.

## 2017-09-05 ENCOUNTER — Encounter: Payer: Self-pay | Admitting: Cardiology

## 2017-09-05 ENCOUNTER — Ambulatory Visit (INDEPENDENT_AMBULATORY_CARE_PROVIDER_SITE_OTHER): Payer: Medicare Other | Admitting: Cardiology

## 2017-09-05 VITALS — BP 110/78 | HR 98 | Ht 76.0 in | Wt 294.0 lb

## 2017-09-05 DIAGNOSIS — Z79899 Other long term (current) drug therapy: Secondary | ICD-10-CM

## 2017-09-05 DIAGNOSIS — I110 Hypertensive heart disease with heart failure: Secondary | ICD-10-CM

## 2017-09-05 DIAGNOSIS — I25119 Atherosclerotic heart disease of native coronary artery with unspecified angina pectoris: Secondary | ICD-10-CM | POA: Diagnosis not present

## 2017-09-05 DIAGNOSIS — I482 Chronic atrial fibrillation, unspecified: Secondary | ICD-10-CM

## 2017-09-05 MED ORDER — TORSEMIDE 20 MG PO TABS
20.0000 mg | ORAL_TABLET | Freq: Two times a day (BID) | ORAL | 3 refills | Status: DC
Start: 2017-09-05 — End: 2020-11-01

## 2017-09-05 NOTE — Patient Instructions (Signed)
Medication Instructions:  Your physician has recommended you make the following change in your medication:  STOP furosemide START torsemide 20 mg twice daily  Labwork: Your physician recommends that you return for lab work in: CMP, TSH, BNP at next labs [facility].  Testing/Procedures: None  Follow-Up: Your physician recommends that you schedule a follow-up appointment in: 6 weeks.   Any Other Special Instructions Will Be Listed Below (If Applicable).     If you need a refill on your cardiac medications before your next appointment, please call your pharmacy.
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
## 2017-10-21 ENCOUNTER — Ambulatory Visit: Payer: Medicare Other | Admitting: Cardiology

## 2018-01-20 NOTE — Progress Notes (Signed)
Cardiology Office Note:    Date:  01/21/2018   ID:  Austin Hartman, DOB Jul 18, 1951, MRN [id number]  PCP:  Shelbie Ammons, MD  Cardiologist:  Norman Herrlich, MD    Referring MD: Shelbie Ammons, MD    ASSESSMENT:    1. Hypertensive heart disease with heart failure (HCC)   2. Chronic atrial fibrillation (HCC)   3. High risk medication use   4. Hyperlipidemia, unspecified hyperlipidemia type   5. On amiodarone therapy    PLAN:    In order of problems listed above:  1. Stable, continue current diuretic 2. Stable, rate controlled and continue metoprolol, digoxin and amiodarone 3. Check digoxin level every 3 mos, goal < 1.0 4. Stable, continue his statin with CAD 5. Stable, continue low dose amiodarone, check CMP and TDSH T4 every 6 mos and needs a CXR to screen for toxicity 6. Stable CAD continue medical treatment   Next appointment: 4 months   Medication Adjustments/Labs and Tests Ordered: Current medicines are reviewed at length with the patient today.  Concerns regarding medicines are outlined above.  Orders Placed This Encounter  Procedures  . EKG 12-Lead   No orders of the defined types were placed in this encounter.   Chief Complaint  Patient presents with  . Follow-up    6 week flup appt  . Atrial Fibrillation  . Congestive Heart Failure  . Coronary Artery Disease  . Hypertension    History of Present Illness:    Austin Hartman is a 67 y.o. male with a hx of CAD with previous PCI, chronic Atrial Fibrillation on DAPT, Dyslipidemia,HTNand seizure disorder last seen 09/05/17.  ASSESSMENT:    1. Hypertensive heart disease with heart failure (HCC)   2. Coronary artery disease involving native coronary artery of native heart with angina pectoris (HCC)   3. Chronic atrial fibrillation (HCC)   4. Hyperlipidemia, unspecified hyperlipidemia type   5. High risk medication use    PLAN:    In order of problems listed above:  1.   He Is mildly decompensated  related to dietary sodium intake.  He shares meals and skilled nursing facility and his sister brings him diet drinks.  I will increase his diuretic and switch him to more potent loop diuretic and asked to be contacted if he is not improved. 7. Stable continue medical therapy which includes long-term dual antiplatelet therapy aspirin and clopidogrel beta-blocker and statin 8. Stable rate controlled continue current medications including digoxin and amiodarone and beta-blocker.  I asked his healthcare providers to check liver function thyroid renal function and digoxin level. 9. Stable continue statin lipid profile be checked at the skilled nursing facility 10. Digoxin amiodarone stable continue same  Compliance with diet, lifestyle and medications: yes supervised at SNF, no recent labs were sent in his records He has had a stressful day and was brought to the home site resulting in delay.  His sister is not present.  He has had no interim hospitalizations at this time denies shortness of breath chest pain palpitation or syncope and has chronic right lower extremity pain and swelling in his paretic side.  I reviewed his medications they are appropriate blood pressure is mildly reduced but is not taking vasodilators or antihypertensives.  His rate is controlled on the present regimen of low-dose beta-blocker digoxin and amiodarone on remind the staff.  To check levels for toxicity CMP and thyroid and chest x-ray for amiodarone toxicity and renal function every 6 months.  I will  plan to see him in [facility] office in follow-up this summer and probably do an echocardiogram about that time. Past Medical History:  Diagnosis Date  . Abdominal pain, generalized 03/29/2016  . Altered mental state 03/29/2016  . At high risk for aspiration 03/29/2016  . CHF (congestive heart failure) (HCC)   . Chronic constipation 03/29/2016  . Coronary artery disease   . Diarrhea   . DNR (do not resuscitate) 03/29/2016  . Dysphagia   .  Epilepsy (HCC) 03/29/2016  . Essential hypertension   . Hemiplegia (HCC) 12/13/2011  . History of traumatic brain injury   . Hyperlipidemia 06/30/2015  . Hypertension   . Hypotension, unspecified 03/29/2016  . Hypothermia 03/29/2016  . Left hemiparesis (HCC) 03/29/2016  . Leukocytosis 03/29/2016  . 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 Partial epilepsy with impairment of consciousness, intractable (HCC) 12/13/2011  . Sepsis (HCC)   . Stroke (HCC)   . TBI (traumatic brain injury) (HCC)   . Typical atrial flutter (HCC) 05/07/2016    Past Surgical History:  Procedure Laterality Date  . BRAIN SURGERY    . CARDIAC SURGERY    . CHOLECYSTECTOMY    . CORONARY ANGIOPLASTY WITH STENT PLACEMENT    . HERNIA REPAIR      Current Medications: Current Meds  Medication Sig  . acetaminophen (TYLENOL) 325 MG tablet Take 650 mg by mouth 2 (two) times daily.  Marland Kitchen. amiodarone (PACERONE) 200 MG tablet Take 200 mg by mouth daily.   Marland Kitchen. aspirin EC 81 MG tablet Take 81 mg by mouth daily.  Marland Kitchen. atorvastatin (LIPITOR) 20 MG tablet Take 20 mg by mouth daily.  . bethanechol (URECHOLINE) 25 MG tablet Take 25 mg by mouth 3 (three) times daily.  . cloBAZam (ONFI) 20 MG tablet Take 40 mg by mouth every evening.  . clopidogrel (PLAVIX) 75 MG tablet Take 75 mg by mouth daily.  . Cranberry 450 MG CAPS Take 450 mg by mouth daily.  . digoxin (LANOXIN) 0.125 MG tablet Take 125 mcg daily by mouth.   . finasteride (PROSCAR) 5 MG tablet Take 5 mg by mouth daily.  . fluticasone (FLONASE) 50 MCG/ACT nasal spray Place 2 sprays daily into both nostrils.  Marland Kitchen. ipratropium-albuterol (DUONEB) 0.5-2.5 (3) MG/3ML SOLN Take 3 mLs by nebulization 4 (four) times daily.  Marland Kitchen. lamoTRIgine (LAMICTAL) 200 MG tablet Take 400 mg by mouth 2 (two) times daily.  Marland Kitchen. levETIRAcetam (KEPPRA) 1000 MG tablet Take 1,000 mg by mouth 2 (two)  times daily.   Marland Kitchen. levETIRAcetam (KEPPRA) 250 MG tablet Take 250 mg by mouth daily.   Marland Kitchen. levothyroxine (SYNTHROID, LEVOTHROID) 25 MCG tablet Take 25 mcg by mouth daily before breakfast.  . linaclotide (LINZESS) 145 MCG CAPS capsule Take 145 mcg by mouth daily before breakfast.  . loratadine (CLARITIN) 10 MG tablet Take 10 mg by mouth daily as needed for allergies.  . metoprolol tartrate (LOPRESSOR) 25 MG tablet Take 12.5 mg by mouth 3 (three) times daily.   . montelukast (SINGULAIR) 10 MG tablet Take 10 mg by mouth at bedtime.  . Multiple Vitamins-Minerals (MULTIVITAMIN PO) Take 1 tablet by mouth daily.  . pantoprazole (PROTONIX) 40 MG tablet Take 1 tablet (40 mg total) by mouth daily. (Patient taking differently: Take 20 mg daily by mouth. )  . polyethylene glycol (MIRALAX / GLYCOLAX) packet Take 17 g by mouth daily as needed for mild constipation.  . sennosides-docusate sodium (SENOKOT-S) 8.6-50  MG tablet Take 2 tablets by mouth daily.  . sodium fluoride (PREVIDENT 5000 PLUS) 1.1 % CREA dental cream Place 1 application onto teeth 2 (two) times daily.  . tamsulosin (FLOMAX) 0.4 MG CAPS capsule Take 0.4 mg by mouth daily after supper.  . torsemide (DEMADEX) 20 MG tablet Take 1 tablet (20 mg total) 2 (two) times daily by mouth.     Allergies:   Acetaminophen; Azithromycin; Biaxin [clarithromycin]; Diltiazem; and Verapamil   Social History   Socioeconomic History  . Marital status: Single    Spouse name: Not on file  . Number of children: Not on file  . Years of education: Not on file  . Highest education level: Not on file  Occupational History  . Not on file  Social Needs  . Financial resource strain: Not on file  . Food insecurity:    Worry: Not on file    Inability: Not on file  . Transportation needs:    Medical: Not on file    Non-medical: Not on file  Tobacco Use  . Smoking status: Never Smoker  . Smokeless tobacco: Never Used  Substance and Sexual Activity  . Alcohol use:  No  . Drug use: No  . Sexual activity: Not on file  Lifestyle  . Physical activity:    Days per week: Not on file    Minutes per session: Not on file  . Stress: Not on file  Relationships  . Social connections:    Talks on phone: Not on file    Gets together: Not on file    Attends religious service: Not on file    Active member of club or organization: Not on file    Attends meetings of clubs or organizations: Not on file    Relationship status: Not on file  Other Topics Concern  . Not on file  Social History Narrative  . Not on file     Family History: The patient's family history includes Heart attack in his father; Hyperlipidemia in his father; Hypertension in his father and mother; Lung cancer in his mother. ROS:   Please see the history of present illness.    All other systems reviewed and are negative.  EKGs/Labs/Other Studies Reviewed:    The following studies were reviewed today:  EKG:  EKG ordered today.  The ekg ordered today demonstrates Aflutter controlled VR  Recent Labs: No results found for requested labs within last 8760 hours.  Recent Lipid Panel No results found for: CHOL, TRIG, HDL, CHOLHDL, VLDL, LDLCALC, LDLDIRECT  Physical Exam:    VS:  BP 94/68 (BP Location: Left Arm, Patient Position: Sitting, Cuff Size: Large)   Pulse 95   Ht 6\' 4"  (1.93 m)   Wt 285 lb (129.3 kg)   SpO2 98%   BMI 34.69 kg/m     Wt Readings from Last 3 Encounters:  01/21/18 285 lb (129.3 kg)  09/05/17 294 lb (133.4 kg)  08/26/17 278 lb (126.1 kg)     GEN:  Well nourished, well developed in no acute distress HEENT: Normal NECK: No JVD; No carotid bruits LYMPHATICS: No lymphadenopathy CARDIAC: Irr Irr no S3 or MR  RESPIRATORY:  Clear to auscultation without rales, wheezing or rhonchi  ABDOMEN: Soft, non-tender, non-distended MUSCULOSKELETAL:  1-2+ chronic RLE  edema; No deformity  SKIN: Warm and dry NEUROLOGIC:  Alert and oriented x 3 PSYCHIATRIC:  Normal affect      Signed, Norman Herrlich, MD  01/21/2018 11:26 AM    [facility]

## 2018-01-21 ENCOUNTER — Ambulatory Visit (INDEPENDENT_AMBULATORY_CARE_PROVIDER_SITE_OTHER): Payer: Medicare Other | Admitting: Cardiology

## 2018-01-21 ENCOUNTER — Encounter: Payer: Self-pay | Admitting: Cardiology

## 2018-01-21 VITALS — BP 94/68 | HR 95 | Ht 76.0 in | Wt 285.0 lb

## 2018-01-21 DIAGNOSIS — I482 Chronic atrial fibrillation, unspecified: Secondary | ICD-10-CM

## 2018-01-21 DIAGNOSIS — Z79899 Other long term (current) drug therapy: Secondary | ICD-10-CM | POA: Diagnosis not present

## 2018-01-21 DIAGNOSIS — E785 Hyperlipidemia, unspecified: Secondary | ICD-10-CM | POA: Diagnosis not present

## 2018-01-21 DIAGNOSIS — I110 Hypertensive heart disease with heart failure: Secondary | ICD-10-CM | POA: Diagnosis not present

## 2018-01-21 NOTE — Patient Instructions (Addendum)
Medication Instructions:  Your physician recommends that you continue on your current medications as directed. Please refer to the Current Medication list given to you today.  Labwork: None  Testing/Procedures: You had an EKG today.  Follow-Up: Your physician wants you to follow-up in: 4 months. You will receive a reminder letter in the mail two months in advance. If you don't receive a letter, please call our office to schedule the follow-up appointment.  Any Other Special Instructions Will Be Listed Below (If Applicable).     If you need a refill on your cardiac medications before your next appointment, please call your pharmacy.    Heart Failure  Weigh yourself every morning when you first wake up and record on a calender or note pad, bring this to your office visits. Using a pill tender can help with taking your medications consistently.  Limit your fluid intake to 2 liters daily  Limit your sodium intake to less than 2-3 grams daily. Ask if you need dietary teaching.  If you gain more than 3 pounds (from your dry weight ), double your dose of diuretic for the day.  If you gain more than 5 pounds (from your dry weight), double your dose of lasix and call your heart failure doctor.  Please do not smoke tobacco since it is very bad for your heart.  Please do not drink alcohol since it can worsen your heart failure.Also avoid OTC nonsteroidal drugs, such as advil, aleve and motrin.  Try to exercise for at least 30 minutes every day because this will help your heart be more efficient. You may be eligible for supervised cardiac rehab, ask your physician.

## 2018-02-24 ENCOUNTER — Ambulatory Visit (INDEPENDENT_AMBULATORY_CARE_PROVIDER_SITE_OTHER): Payer: Medicare Other | Admitting: Neurology

## 2018-02-24 ENCOUNTER — Other Ambulatory Visit: Payer: Self-pay

## 2018-02-24 ENCOUNTER — Encounter: Payer: Self-pay | Admitting: Neurology

## 2018-02-24 VITALS — BP 106/70 | HR 91

## 2018-02-24 DIAGNOSIS — Z87820 Personal history of traumatic brain injury: Secondary | ICD-10-CM

## 2018-02-24 DIAGNOSIS — G8191 Hemiplegia, unspecified affecting right dominant side: Secondary | ICD-10-CM

## 2018-02-24 DIAGNOSIS — M79604 Pain in right leg: Secondary | ICD-10-CM

## 2018-02-24 DIAGNOSIS — G40119 Localization-related (focal) (partial) symptomatic epilepsy and epileptic syndromes with simple partial seizures, intractable, without status epilepticus: Secondary | ICD-10-CM

## 2018-02-24 NOTE — Patient Instructions (Signed)
1. Continue all your seizure medications as prescribed 2. Refer to Physical Medicine and Rehab for right leg pain 3. Follow-up in 6 months, call for any changes  Seizure Precautions: 1. If medication has been prescribed for you to prevent seizures, take it exactly as directed.  Do not stop taking the medicine without talking to your doctor first, even if you have not had a seizure in a long time.   2. Avoid activities in which a seizure would cause danger to yourself or to others.  Don't operate dangerous machinery, swim alone, or climb in high or dangerous places, such as on ladders, roofs, or girders.  Do not drive unless your doctor says you may.  3. If you have any warning that you may have a seizure, lay down in a safe place where you can't hurt yourself.    4.  No driving for 6 months from last seizure, as per [facility].   Please refer to the following link on the [employer]'s website for more information: http://www.epilepsyfoundation.org/answerplace/Social/driving/drivingu.cfm   5.  Maintain good sleep hygiene. Avoid alcohol.  6.  Contact your doctor if you have any problems that may be related to the medicine you are taking.  7.  Call 911 and bring the patient back to the ED if:        A.  The seizure lasts longer than 5 minutes.       B.  The patient doesn't awaken shortly after the seizure  C.  The patient has new problems such as difficulty seeing, speaking or moving  D.  The patient was injured during the seizure  E.  The patient has a temperature over 102 F (39C)  F.  The patient vomited and now is having trouble breathing
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
## 2018-03-13 ENCOUNTER — Telehealth: Payer: Self-pay | Admitting: Neurology

## 2018-03-13 NOTE — Telephone Encounter (Signed)
It appears that pt has been scheduled with PT.

## 2018-03-13 NOTE — Telephone Encounter (Signed)
Austin Hartman called for Austin Hartman regarding information for an Youth worker (Referral)? Please Call. Thanks

## 2018-03-28 ENCOUNTER — Ambulatory Visit: Payer: Medicare Other | Admitting: Physical Medicine & Rehabilitation

## 2018-04-02 ENCOUNTER — Encounter: Payer: Medicare Other | Admitting: Physical Medicine & Rehabilitation

## 2018-05-15 ENCOUNTER — Ambulatory Visit: Payer: Medicare Other | Admitting: Physical Medicine & Rehabilitation

## 2018-06-02 ENCOUNTER — Encounter: Payer: Self-pay | Admitting: Cardiology

## 2018-06-02 ENCOUNTER — Ambulatory Visit (INDEPENDENT_AMBULATORY_CARE_PROVIDER_SITE_OTHER): Payer: Medicare Other | Admitting: Cardiology

## 2018-06-02 VITALS — BP 102/64 | HR 91 | Ht 76.0 in | Wt 283.0 lb

## 2018-06-02 DIAGNOSIS — I482 Chronic atrial fibrillation, unspecified: Secondary | ICD-10-CM

## 2018-06-02 DIAGNOSIS — I110 Hypertensive heart disease with heart failure: Secondary | ICD-10-CM

## 2018-06-02 DIAGNOSIS — Z79899 Other long term (current) drug therapy: Secondary | ICD-10-CM

## 2018-06-02 DIAGNOSIS — I25119 Atherosclerotic heart disease of native coronary artery with unspecified angina pectoris: Secondary | ICD-10-CM

## 2018-06-02 DIAGNOSIS — E785 Hyperlipidemia, unspecified: Secondary | ICD-10-CM

## 2018-06-02 NOTE — Patient Instructions (Signed)
Medication Instructions:  Your physician recommends that you continue on your current medications as directed. Please refer to the Current Medication list given to you today.   Labwork: NONE  Testing/Procedures: You had an EKG today  Follow-Up: Your physician wants you to follow-up in: 6 months. You will receive a reminder letter in the mail two months in advance. If you don't receive a letter, please call our office to schedule the follow-up appointment.   Any Other Special Instructions Will Be Listed Below (If Applicable).     If you need a refill on your cardiac medications before your next appointment, please call your pharmacy.   
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
## 2018-09-10 ENCOUNTER — Encounter: Payer: Self-pay | Admitting: Neurology

## 2018-09-10 ENCOUNTER — Ambulatory Visit (INDEPENDENT_AMBULATORY_CARE_PROVIDER_SITE_OTHER): Payer: Medicare Other | Admitting: Neurology

## 2018-09-10 ENCOUNTER — Other Ambulatory Visit: Payer: Self-pay

## 2018-09-10 VITALS — BP 122/76 | HR 85 | Ht 76.0 in | Wt 293.0 lb

## 2018-09-10 DIAGNOSIS — G40119 Localization-related (focal) (partial) symptomatic epilepsy and epileptic syndromes with simple partial seizures, intractable, without status epilepticus: Secondary | ICD-10-CM

## 2018-09-10 DIAGNOSIS — Z87820 Personal history of traumatic brain injury: Secondary | ICD-10-CM

## 2018-09-10 DIAGNOSIS — I25119 Atherosclerotic heart disease of native coronary artery with unspecified angina pectoris: Secondary | ICD-10-CM
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
## 2018-09-15 ENCOUNTER — Encounter: Payer: Self-pay | Admitting: Neurology

## 2018-10-29 ENCOUNTER — Ambulatory Visit: Payer: Medicare Other | Admitting: Nurse Practitioner

## 2019-01-02 ENCOUNTER — Ambulatory Visit: Payer: Medicare Other | Admitting: Cardiology

## 2019-01-14 ENCOUNTER — Ambulatory Visit: Payer: Medicare Other | Admitting: Cardiology

## 2019-04-15 ENCOUNTER — Telehealth: Payer: Self-pay | Admitting: Neurology

## 2019-04-15 NOTE — Telephone Encounter (Signed)
Can we make this an e-visit?

## 2019-04-15 NOTE — Telephone Encounter (Signed)
Ok for virtual visit, thanks 

## 2019-04-15 NOTE — Telephone Encounter (Signed)
Aquino appt 04/27/19. [name] called to say pt in facility and no family can go see him and they are not allowing pt to leave. [name] states pt not able to keep 04/27/19 appt in office.  [name] states pt has had weekly virtual visits with PCP with the nurse in the room with pt.  OK to schedule virtual or would you prefer to delay appt until August in hopes pt will be able to leave facility to come for appt.   [name] [phone]

## 2019-04-16 NOTE — Telephone Encounter (Signed)
Called Austin Hartman scheduled Virtual appt 05/14/19. [name] will check with facility to confirm nurse will be with pt for appt. [name] will call back if there is a conflict.

## 2019-04-17 ENCOUNTER — Telehealth: Payer: Self-pay | Admitting: Neurology

## 2019-04-17 NOTE — Telephone Encounter (Signed)
Error

## 2019-04-27 ENCOUNTER — Ambulatory Visit: Payer: Medicare Other | Admitting: Neurology

## 2019-05-07 ENCOUNTER — Telehealth: Payer: Self-pay | Admitting: Neurology

## 2019-05-07 NOTE — Telephone Encounter (Signed)
error 

## 2019-05-14 ENCOUNTER — Other Ambulatory Visit: Payer: Self-pay

## 2019-05-14 ENCOUNTER — Encounter: Payer: Self-pay | Admitting: Neurology

## 2019-05-14 ENCOUNTER — Telehealth (INDEPENDENT_AMBULATORY_CARE_PROVIDER_SITE_OTHER): Payer: Medicare Other | Admitting: Neurology

## 2019-05-14 VITALS — BP 122/64 | Temp 97.0°F | Ht 76.0 in | Wt 291.3 lb

## 2019-05-14 DIAGNOSIS — G40119 Localization-related (focal) (partial) symptomatic epilepsy and epileptic syndromes with simple partial seizures, intractable, without status epilepticus: Secondary | ICD-10-CM | POA: Diagnosis not present

## 2019-05-14 DIAGNOSIS — Z87820 Personal history of traumatic brain injury: Secondary | ICD-10-CM
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
## 2019-11-19 ENCOUNTER — Telehealth: Payer: Self-pay | Admitting: Cardiology

## 2019-11-19 NOTE — Telephone Encounter (Signed)
Patients sister called in because her brother is due for an appt with Dr. Dulce Sellar. He is in a nursing home and cannot come into the office due to being high risk for covid. She has spoken to the head nurse at the facility and she believes that they will be able to do a virtual appt as long as Dr. Dulce Sellar is able to do so. Please advise patient's sister, [name], if this is possible. She is waiting to hear back from the head nurse where her brother is at then will call to set up the appt.

## 2019-11-19 NOTE — Telephone Encounter (Signed)
I spoke with patient's sister and told her Dr Dulce Sellar does telemedicine visits.  I let her know this could be video or telephone.  She will check with nurse at patient's facility regarding the scheduling of this appointment.

## 2019-12-15 ENCOUNTER — Other Ambulatory Visit: Payer: Self-pay

## 2019-12-15 ENCOUNTER — Encounter: Payer: Self-pay | Admitting: Neurology

## 2019-12-15 ENCOUNTER — Telehealth (INDEPENDENT_AMBULATORY_CARE_PROVIDER_SITE_OTHER): Payer: Medicare Other | Admitting: Neurology

## 2019-12-15 VITALS — BP 106/64 | HR 60 | Temp 97.8°F | Ht 76.0 in | Wt 292.2 lb

## 2019-12-15 DIAGNOSIS — G40119 Localization-related (focal) (partial) symptomatic epilepsy and epileptic syndromes with simple partial seizures, intractable, without status epilepticus: Secondary | ICD-10-CM | POA: Diagnosis not present
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
## 2020-08-15 ENCOUNTER — Other Ambulatory Visit: Payer: Self-pay

## 2020-08-15 ENCOUNTER — Encounter: Payer: Self-pay | Admitting: Neurology

## 2020-08-15 ENCOUNTER — Telehealth (INDEPENDENT_AMBULATORY_CARE_PROVIDER_SITE_OTHER): Payer: Medicare Other | Admitting: Neurology

## 2020-08-15 VITALS — BP 122/70 | HR 76 | Temp 97.3°F | Resp 18 | Ht 76.0 in | Wt 301.7 lb

## 2020-08-15 DIAGNOSIS — G40119 Localization-related (focal) (partial) symptomatic epilepsy and epileptic syndromes with simple partial seizures, intractable, without status epilepticus: Secondary | ICD-10-CM
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
## 2020-10-24 ENCOUNTER — Emergency Department (HOSPITAL_COMMUNITY): Payer: Medicare Other

## 2020-10-24 ENCOUNTER — Encounter (HOSPITAL_COMMUNITY): Payer: Self-pay | Admitting: Family Medicine

## 2020-10-24 ENCOUNTER — Inpatient Hospital Stay (HOSPITAL_COMMUNITY): Payer: Medicare Other

## 2020-10-24 ENCOUNTER — Inpatient Hospital Stay (HOSPITAL_COMMUNITY)
Admission: EM | Admit: 2020-10-24 | Discharge: 2020-11-02 | DRG: 871 | Disposition: A | Discharge status: Skilled Nursing Facility | Payer: Medicare Other | Source: Skilled Nursing Facility | Attending: Internal Medicine | Admitting: Internal Medicine

## 2020-10-24 ENCOUNTER — Inpatient Hospital Stay: Payer: Self-pay

## 2020-10-24 DIAGNOSIS — I25119 Atherosclerotic heart disease of native coronary artery with unspecified angina pectoris: Secondary | ICD-10-CM | POA: Diagnosis present

## 2020-10-24 DIAGNOSIS — I9589 Other hypotension: Secondary | ICD-10-CM | POA: Diagnosis not present

## 2020-10-24 DIAGNOSIS — I110 Hypertensive heart disease with heart failure: Secondary | ICD-10-CM | POA: Diagnosis present

## 2020-10-24 DIAGNOSIS — D72829 Elevated white blood cell count, unspecified: Secondary | ICD-10-CM | POA: Diagnosis present

## 2020-10-24 DIAGNOSIS — N179 Acute kidney failure, unspecified: Secondary | ICD-10-CM | POA: Diagnosis present

## 2020-10-24 DIAGNOSIS — R4182 Altered mental status, unspecified: Secondary | ICD-10-CM | POA: Diagnosis not present

## 2020-10-24 DIAGNOSIS — Z8249 Family history of ischemic heart disease and other diseases of the circulatory system: Secondary | ICD-10-CM | POA: Diagnosis not present

## 2020-10-24 DIAGNOSIS — Z7401 Bed confinement status: Secondary | ICD-10-CM

## 2020-10-24 DIAGNOSIS — E870 Hyperosmolality and hypernatremia: Secondary | ICD-10-CM | POA: Diagnosis not present

## 2020-10-24 DIAGNOSIS — A4189 Other specified sepsis: Secondary | ICD-10-CM | POA: Diagnosis present

## 2020-10-24 DIAGNOSIS — Z79899 Other long term (current) drug therapy: Secondary | ICD-10-CM

## 2020-10-24 DIAGNOSIS — T68XXXA Hypothermia, initial encounter: Secondary | ICD-10-CM | POA: Diagnosis present

## 2020-10-24 DIAGNOSIS — R6520 Severe sepsis without septic shock: Secondary | ICD-10-CM | POA: Diagnosis present

## 2020-10-24 DIAGNOSIS — R197 Diarrhea, unspecified: Secondary | ICD-10-CM

## 2020-10-24 DIAGNOSIS — J9601 Acute respiratory failure with hypoxia: Secondary | ICD-10-CM | POA: Diagnosis present

## 2020-10-24 DIAGNOSIS — R338 Other retention of urine: Secondary | ICD-10-CM | POA: Diagnosis present

## 2020-10-24 DIAGNOSIS — K5909 Other constipation: Secondary | ICD-10-CM | POA: Diagnosis not present

## 2020-10-24 DIAGNOSIS — R471 Dysarthria and anarthria: Secondary | ICD-10-CM | POA: Diagnosis present

## 2020-10-24 DIAGNOSIS — E039 Hypothyroidism, unspecified: Secondary | ICD-10-CM | POA: Diagnosis present

## 2020-10-24 DIAGNOSIS — R140 Abdominal distension (gaseous): Secondary | ICD-10-CM | POA: Diagnosis present

## 2020-10-24 DIAGNOSIS — R680 Hypothermia, not associated with low environmental temperature: Secondary | ICD-10-CM | POA: Diagnosis present

## 2020-10-24 DIAGNOSIS — Z888 Allergy status to other drugs, medicaments and biological substances status: Secondary | ICD-10-CM

## 2020-10-24 DIAGNOSIS — I4892 Unspecified atrial flutter: Secondary | ICD-10-CM | POA: Diagnosis present

## 2020-10-24 DIAGNOSIS — R0902 Hypoxemia: Secondary | ICD-10-CM

## 2020-10-24 DIAGNOSIS — Z7901 Long term (current) use of anticoagulants: Secondary | ICD-10-CM | POA: Diagnosis not present

## 2020-10-24 DIAGNOSIS — Z83438 Family history of other disorder of lipoprotein metabolism and other lipidemia: Secondary | ICD-10-CM

## 2020-10-24 DIAGNOSIS — Z955 Presence of coronary angioplasty implant and graft: Secondary | ICD-10-CM

## 2020-10-24 DIAGNOSIS — F32A Depression, unspecified: Secondary | ICD-10-CM | POA: Diagnosis present

## 2020-10-24 DIAGNOSIS — Z66 Do not resuscitate: Secondary | ICD-10-CM | POA: Diagnosis present

## 2020-10-24 DIAGNOSIS — Z7902 Long term (current) use of antithrombotics/antiplatelets: Secondary | ICD-10-CM

## 2020-10-24 DIAGNOSIS — R1310 Dysphagia, unspecified: Secondary | ICD-10-CM | POA: Diagnosis present

## 2020-10-24 DIAGNOSIS — I5032 Chronic diastolic (congestive) heart failure: Secondary | ICD-10-CM | POA: Diagnosis present

## 2020-10-24 DIAGNOSIS — Z87820 Personal history of traumatic brain injury: Secondary | ICD-10-CM

## 2020-10-24 DIAGNOSIS — Z791 Long term (current) use of non-steroidal anti-inflammatories (NSAID): Secondary | ICD-10-CM

## 2020-10-24 DIAGNOSIS — Z515 Encounter for palliative care: Secondary | ICD-10-CM | POA: Diagnosis not present

## 2020-10-24 DIAGNOSIS — L8990 Pressure ulcer of unspecified site, unspecified stage: Secondary | ICD-10-CM | POA: Insufficient documentation

## 2020-10-24 DIAGNOSIS — E785 Hyperlipidemia, unspecified: Secondary | ICD-10-CM | POA: Diagnosis present

## 2020-10-24 DIAGNOSIS — L89152 Pressure ulcer of sacral region, stage 2: Secondary | ICD-10-CM | POA: Diagnosis present

## 2020-10-24 DIAGNOSIS — R531 Weakness: Secondary | ICD-10-CM | POA: Diagnosis not present

## 2020-10-24 DIAGNOSIS — Z7189 Other specified counseling: Secondary | ICD-10-CM | POA: Diagnosis not present

## 2020-10-24 DIAGNOSIS — U071 COVID-19: Secondary | ICD-10-CM | POA: Diagnosis present

## 2020-10-24 DIAGNOSIS — I959 Hypotension, unspecified: Secondary | ICD-10-CM | POA: Diagnosis present

## 2020-10-24 DIAGNOSIS — G40909 Epilepsy, unspecified, not intractable, without status epilepticus: Secondary | ICD-10-CM | POA: Diagnosis not present

## 2020-10-24 DIAGNOSIS — Z9189 Other specified personal risk factors, not elsewhere classified: Secondary | ICD-10-CM

## 2020-10-24 DIAGNOSIS — Z7982 Long term (current) use of aspirin: Secondary | ICD-10-CM

## 2020-10-24 DIAGNOSIS — I482 Chronic atrial fibrillation, unspecified: Secondary | ICD-10-CM | POA: Diagnosis present

## 2020-10-24 DIAGNOSIS — I6203 Nontraumatic chronic subdural hemorrhage: Secondary | ICD-10-CM | POA: Diagnosis not present

## 2020-10-24 DIAGNOSIS — A419 Sepsis, unspecified organism: Secondary | ICD-10-CM | POA: Diagnosis not present

## 2020-10-24 DIAGNOSIS — T68XXXD Hypothermia, subsequent encounter: Secondary | ICD-10-CM

## 2020-10-24 DIAGNOSIS — J9811 Atelectasis: Secondary | ICD-10-CM | POA: Diagnosis present

## 2020-10-24 DIAGNOSIS — N401 Enlarged prostate with lower urinary tract symptoms: Secondary | ICD-10-CM | POA: Diagnosis present

## 2020-10-24 DIAGNOSIS — G9341 Metabolic encephalopathy: Secondary | ICD-10-CM | POA: Diagnosis present

## 2020-10-24 DIAGNOSIS — E869 Volume depletion, unspecified: Secondary | ICD-10-CM | POA: Diagnosis present

## 2020-10-24 DIAGNOSIS — R7989 Other specified abnormal findings of blood chemistry: Secondary | ICD-10-CM | POA: Diagnosis not present

## 2020-10-24 LAB — C DIFFICILE QUICK SCREEN W PCR REFLEX
C Diff antigen: NEGATIVE
C Diff interpretation: NOT DETECTED
C Diff toxin: NEGATIVE

## 2020-10-24 LAB — D-DIMER, QUANTITATIVE: D-Dimer, Quant: 3.46 ug/mL-FEU — ABNORMAL HIGH (ref 0.00–0.50)

## 2020-10-24 LAB — CBC WITH DIFFERENTIAL/PLATELET
Abs Immature Granulocytes: 0.05 10*3/uL (ref 0.00–0.07)
Basophils Absolute: 0 10*3/uL (ref 0.0–0.1)
Basophils Relative: 0 %
Eosinophils Absolute: 0 10*3/uL (ref 0.0–0.5)
Eosinophils Relative: 0 %
HCT: 42.8 % (ref 39.0–52.0)
Hemoglobin: 13.1 g/dL (ref 13.0–17.0)
Immature Granulocytes: 0 %
Lymphocytes Relative: 7 %
Lymphs Abs: 0.9 10*3/uL (ref 0.7–4.0)
MCH: 29.4 pg (ref 26.0–34.0)
MCHC: 30.6 g/dL (ref 30.0–36.0)
MCV: 96 fL (ref 80.0–100.0)
Monocytes Absolute: 0.6 10*3/uL (ref 0.1–1.0)
Monocytes Relative: 4 %
Neutro Abs: 12.8 10*3/uL — ABNORMAL HIGH (ref 1.7–7.7)
Neutrophils Relative %: 89 %
Platelets: 210 10*3/uL (ref 150–400)
RBC: 4.46 MIL/uL (ref 4.22–5.81)
RDW: 14.8 % (ref 11.5–15.5)
WBC: 14.5 10*3/uL — ABNORMAL HIGH (ref 4.0–10.5)
nRBC: 0 % (ref 0.0–0.2)

## 2020-10-24 LAB — FERRITIN: Ferritin: 32 ng/mL (ref 24–336)

## 2020-10-24 LAB — COMPREHENSIVE METABOLIC PANEL
ALT: 17 U/L (ref 0–44)
AST: 33 U/L (ref 15–41)
Albumin: 3.5 g/dL (ref 3.5–5.0)
Alkaline Phosphatase: 139 U/L — ABNORMAL HIGH (ref 38–126)
Anion gap: 16 — ABNORMAL HIGH (ref 5–15)
BUN: 54 mg/dL — ABNORMAL HIGH (ref 8–23)
CO2: 24 mmol/L (ref 22–32)
Calcium: 8.7 mg/dL — ABNORMAL LOW (ref 8.9–10.3)
Chloride: 104 mmol/L (ref 98–111)
Creatinine, Ser: 2.55 mg/dL — ABNORMAL HIGH (ref 0.61–1.24)
GFR, Estimated: 27 mL/min — ABNORMAL LOW (ref >60)
Glucose, Bld: 139 mg/dL — ABNORMAL HIGH (ref 70–99)
Potassium: 4.3 mmol/L (ref 3.5–5.1)
Sodium: 144 mmol/L (ref 135–145)
Total Bilirubin: 1.2 mg/dL (ref 0.3–1.2)
Total Protein: 8.3 g/dL — ABNORMAL HIGH (ref 6.5–8.1)

## 2020-10-24 LAB — LACTIC ACID, PLASMA
Lactic Acid, Venous: 4.8 mmol/L (ref 0.5–1.9)
Lactic Acid, Venous: 4.5 mmol/L (ref 0.5–1.9)

## 2020-10-24 LAB — PROCALCITONIN: Procalcitonin: 0.35 ng/mL

## 2020-10-24 LAB — RESP PANEL BY RT-PCR (FLU A&B, COVID) ARPGX2
Influenza A by PCR: NEGATIVE
Influenza B by PCR: NEGATIVE
SARS Coronavirus 2 by RT PCR: POSITIVE — AB

## 2020-10-24 LAB — C-REACTIVE PROTEIN: CRP: 3.2 mg/dL — ABNORMAL HIGH (ref <1.0)

## 2020-10-24 LAB — BRAIN NATRIURETIC PEPTIDE: B Natriuretic Peptide: 144.3 pg/mL — ABNORMAL HIGH (ref 0.0–100.0)

## 2020-10-24 LAB — LACTATE DEHYDROGENASE: LDH: 187 U/L (ref 98–192)

## 2020-10-24 LAB — TRIGLYCERIDES: Triglycerides: 63 mg/dL (ref <150)

## 2020-10-24 LAB — TROPONIN I (HIGH SENSITIVITY): Troponin I (High Sensitivity): 5 ng/L (ref <18)

## 2020-10-24 LAB — FIBRINOGEN: Fibrinogen: 727 mg/dL — ABNORMAL HIGH (ref 210–475)

## 2020-10-24 LAB — DIGOXIN LEVEL: Digoxin Level: 1.7 ng/mL (ref 0.8–2.0)

## 2020-10-24 MED ORDER — SODIUM CHLORIDE 0.9 % IV SOLN: INTRAVENOUS | Status: DC

## 2020-10-24 MED ORDER — VANCOMYCIN HCL 2000 MG/400ML IV SOLN
2000.0000 mg | Freq: Once | INTRAVENOUS | Status: AC
  Administered 2020-10-24: 2000 mg via INTRAVENOUS
  Filled 2020-10-24: qty 400

## 2020-10-24 MED ORDER — PIPERACILLIN-TAZOBACTAM 3.375 G IVPB
3.3750 g | Freq: Once | INTRAVENOUS | Status: AC
  Administered 2020-10-24: 3.375 g via INTRAVENOUS
  Filled 2020-10-24: qty 50

## 2020-10-24 MED ORDER — SODIUM CHLORIDE 0.9 % IV BOLUS
500.0000 mL | Freq: Once | INTRAVENOUS | Status: AC
  Administered 2020-10-24: 500 mL via INTRAVENOUS

## 2020-10-24 MED ORDER — VANCOMYCIN HCL IN DEXTROSE 1-5 GM/200ML-% IV SOLN
1000.0000 mg | Freq: Once | INTRAVENOUS | Status: DC
  Filled 2020-10-24: qty 200

## 2020-10-24 MED ORDER — LOPERAMIDE HCL 2 MG PO CAPS
4.0000 mg | ORAL_CAPSULE | Freq: Once | ORAL | Status: AC
  Administered 2020-10-25: 4 mg via ORAL
  Filled 2020-10-24: qty 2

## 2020-10-24 MED ORDER — SODIUM CHLORIDE 0.9 % IV BOLUS
1000.0000 mL | Freq: Once | INTRAVENOUS | Status: AC
  Administered 2020-10-24: 1000 mL via INTRAVENOUS

## 2020-10-24 NOTE — ED Notes (Signed)
Patient stuck x2 in an attempt to obtain 2nd Troponin and Lactic without success. Main phlebotomy called for assistance.

## 2020-10-24 NOTE — ED Notes (Signed)
Tegeler MD made aware of rectal temp.

## 2020-10-24 NOTE — ED Notes (Signed)
IV team at bedside 

## 2020-10-24 NOTE — ED Provider Notes (Addendum)
Sign out note  70 y/o male with history of TBI, chronic subdural hematoma, and prior brain surgery who presents with altered mental status episode, hypoxia, cough, shortness of breath, diarrhea, malaise, reported hypothermia, and recent COVID-19 positive test.  Follow-up labs, reassessment.  4:00 PM Received signout from Dr. Rush Landmark  On my assessment, patient was having declining mental status, significant increased work of breathing, hypotensive, ill-appearing.  Concern for superimposed bacterial sepsis.  Receiving fluid bolus and broad-spectrum antibiotics.  I engaged in goals of care conversation with patient's sister, [name].  She reports that she is medical power of attorney.  Discussed my concerns that patient has a very poor prognosis given his current presentation, Covid positive.  She states that she was unsure if he had a formal DNR but said patient had clearly stated previously he would not want to be put on a ventilator.  States patient would not want invasive procedures, any form of life support.  She is interested in pursuing other measures such as supplemental oxygen, fluids, antibiotics, etc.  Discussed case with Dr. Jarvis Newcomer with hospitalist service.  He will admit patient and assume care.  .Critical Care Performed by: Milagros Loll, MD Authorized by: Milagros Loll, MD   Critical care provider statement:    Critical care time (minutes):  45   Critical care was necessary to treat or prevent imminent or life-threatening deterioration of the following conditions:  Sepsis, shock, respiratory failure and CNS failure or compromise   Critical care was time spent personally by me on the following activities:  Discussions with consultants, evaluation of patient's response to treatment, examination of patient, ordering and performing treatments and interventions, ordering and review of laboratory studies, ordering and review of radiographic studies, pulse oximetry, re-evaluation of  patient's condition, obtaining history from patient or surrogate and review of old charts  Ultrasound ED Peripheral IV (Provider)  Date/Time: 10/24/2020 10:06 PM Performed by: Milagros Loll, MD Authorized by: Milagros Loll, MD   Procedure details:    Indications: hydration and hypotension     Location:  Left AC   Angiocath:  20 G   Bedside Ultrasound Guided: Yes     Images: not archived     Patient tolerated procedure without complications: Yes   Ultrasound ED Peripheral IV (Provider)  Date/Time: 10/24/2020 10:06 PM Performed by: Milagros Loll, MD Authorized by: Milagros Loll, MD   Procedure details:    Indications: hydration and hypotension     Location:  Right AC   Angiocath:  20 G   Bedside Ultrasound Guided: Yes     Images: not archived     Patient tolerated procedure without complications: Yes     Dressing applied: Yes          Milagros Loll, MD 10/24/20 2207

## 2020-10-24 NOTE — ED Notes (Signed)
Date and time results received: 10/24/20 6:09 PM  Test: LA Critical Value: 4.8  Name of Provider Notified: Dykstra  Orders Received? Or Actions Taken?:

## 2020-10-24 NOTE — H&P (Incomplete)
History and Physical   Austin Hartman QBH:[id number] DOB: 05/16/1951 DOA: 10/24/2020  Referring MD/NP/PA: Dr. Stevie Kern, EDP PCP: Galvin Proffer, MD  Patient coming from: Long term care facility  Chief Complaint: AMS,   HPI: Austin Hartman is a 70 y.o. male with a history of TBI, CVA, chronic SDH, s/p craniectomy, epilepsy, and recent covid-19 diagnosis who presented from LTC facility with altered mental status, cough, hypoxia to 70%'s and hypothermia on 10/24/2020. History obtained from records from facility, EDP, and family due to patient's altered mentation.   ED Course: He appeared ill, hypotensive, hypothermic with declining mental status, found to have leukocytosis, ARF, and has had copious diarrhea with negative C. diff testing. EDP discussed poor prognosis with patient's sister who is HCPOA. Visitation offered, confirmed DNR and desire to avoid invasive procedures including central lines and ventilator. IV fluids, antibiotics have been given with slight improvement in blood pressure. Pt admitted to SDU. Palliative care consulted.   Review of Systems: Unable to obtain due to confusion. Otherwise per HPI.   Past Medical History:  Diagnosis Date  . Abdominal pain, generalized 03/29/2016  . Altered mental state 03/29/2016  . At high risk for aspiration 03/29/2016  . CHF (congestive heart failure) (HCC)   . Chronic constipation 03/29/2016  . Coronary artery disease   . Diarrhea   . DNR (do not resuscitate) 03/29/2016  . Dysphagia   . Epilepsy (HCC) 03/29/2016  . Essential hypertension   . Hemiplegia (HCC) 12/13/2011  . History of traumatic brain injury   . Hyperlipidemia 06/30/2015  . Hypertension   . Hypotension, unspecified 03/29/2016  . Hypothermia 03/29/2016  . Left hemiparesis (HCC) 03/29/2016  . Leukocytosis 03/29/2016  . 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 Partial  epilepsy with impairment of consciousness, intractable (HCC) 12/13/2011  . Sepsis (HCC)   . Stroke (HCC)   . TBI (traumatic brain injury) (HCC)   . Typical atrial flutter (HCC) 05/07/2016   Past Surgical History:  Procedure Laterality Date  . BRAIN SURGERY    . CARDIAC SURGERY    . CHOLECYSTECTOMY    . CORONARY ANGIOPLASTY WITH STENT PLACEMENT    . HERNIA REPAIR     - Nonsmoker, no EtOH or illicit drugs, has been long term care resident for *** years.    reports that he has never smoked. He has never used smokeless tobacco. He reports that he does not drink alcohol and does not use drugs. Allergies  Allergen Reactions  . Acetaminophen Other (See Comments)    unknown  . Azithromycin Other (See Comments)    unknown  . Biaxin [Clarithromycin] Other (See Comments)    unknown  . Diltiazem Other (See Comments)    unknown  . Other Other (See Comments)    Darvocet-N - unknown  . Verapamil Other (See Comments)    unknown   Family History  Problem Relation Age of Onset  . Hypertension Mother   . Lung cancer Mother   . Heart attack Father   . Hypertension Father   . Hyperlipidemia Father    - Family history otherwise reviewed and not pertinent.  Prior to Admission medications   Medication Sig Start Date End Date Taking? Authorizing Provider  acetaminophen (TYLENOL) 325 MG tablet Take 650 mg by mouth every 6 (six) hours as needed for moderate pain.   Yes [provider]  amiodarone (PACERONE) 200 MG tablet Take 200 mg by mouth  daily.    Yes [provider]  ammonium lactate (AMLACTIN) 12 % cream Apply 1 g topically at bedtime. Both legs/feet   Yes [provider]  aspirin EC 81 MG tablet Take 81 mg by mouth daily.   Yes [provider]  atorvastatin (LIPITOR) 20 MG tablet Take 20 mg by mouth daily.   Yes [provider]  bethanechol (URECHOLINE) 10 MG tablet Take 10 mg by mouth 3 (three) times daily.    Yes [provider]   bisacodyl (DULCOLAX) 10 MG suppository Place 10 mg rectally 2 (two) times daily as needed for moderate constipation.   Yes [provider]  cloBAZam (ONFI) 10 MG tablet Take 40 mg by mouth daily.   Yes [provider]  clopidogrel (PLAVIX) 75 MG tablet Take 75 mg by mouth daily.   Yes [provider]  Cranberry 450 MG CAPS Take 450 mg by mouth daily.   Yes [provider]  digoxin (LANOXIN) 0.125 MG tablet Take 125 mcg daily by mouth.    Yes [provider]  escitalopram (LEXAPRO) 10 MG tablet Take 10 mg by mouth daily.   Yes [provider]  finasteride (PROSCAR) 5 MG tablet Take 5 mg by mouth daily.   Yes [provider]  gabapentin (NEURONTIN) 300 MG capsule Take 300 mg by mouth at bedtime.   Yes [provider]  guaifenesin (HUMIBID E) 400 MG TABS tablet Take 400 mg by mouth 2 (two) times daily.   Yes [provider]  lamoTRIgine (LAMICTAL) 200 MG tablet Take 400 mg by mouth 2 (two) times daily.   Yes [provider]  levETIRAcetam (KEPPRA) 1000 MG tablet Take 1,000 mg by mouth 2 (two) times daily.    Yes [provider]  levETIRAcetam (KEPPRA) 250 MG tablet Take 250 mg by mouth daily. Takes along with 1000 mg tablet to equal 1250 mg in the morning.   Yes [provider]  Levothyroxine Sodium 112 MCG CAPS Take 112 mcg by mouth daily before breakfast.    Yes [provider]  linaclotide (LINZESS) 290 MCG CAPS capsule Take 290 mcg by mouth daily.   Yes [provider]  loratadine (CLARITIN) 10 MG tablet Take 10 mg by mouth daily.   Yes [provider]  Magnesium Hydroxide (MILK OF MAGNESIA PO) Take 30 mLs by mouth daily as needed (constipation).   Yes [provider]  meloxicam (MOBIC) 15 MG tablet Take 15 mg by mouth daily.   Yes [provider]  Menthol, Topical Analgesic, (BIOFREEZE) 4 % GEL Apply 1 application topically 2 (two) times daily.    Yes [provider]  metoprolol tartrate (LOPRESSOR) 25 MG tablet Take 12.5 mg by mouth 3 (three) times daily.    Yes [provider]  montelukast (SINGULAIR) 10 MG tablet Take 10 mg by mouth at bedtime.   Yes [provider]  Multiple Vitamins-Minerals (MULTIVITAMIN PO) Take 1 tablet by mouth daily.   Yes [provider]  pantoprazole (PROTONIX) 20 MG tablet Take 20 mg by mouth daily.   Yes [provider]  polyethylene glycol (MIRALAX / GLYCOLAX) packet Take 17 g by mouth daily.   Yes [provider]  sennosides-docusate sodium (SENOKOT-S) 8.6-50 MG tablet Take 2 tablets by mouth 2 (two) times daily.   Yes [provider]  sodium fluoride (PREVIDENT 5000 PLUS) 1.1 % CREA dental cream Place 1 application onto teeth 2 (two) times daily.   Yes [provider]  tamsulosin (FLOMAX) 0.4 MG CAPS capsule Take 0.8 mg by mouth daily after supper.   Yes [provider]  torsemide (DEMADEX) 20 MG tablet Take 1 tablet (20 mg total) 2 (two) times daily by mouth. Patient taking differently: Take 40 mg by mouth 2 (two) times daily. 09/05/17  Yes Baldo Daub, MD  pantoprazole (PROTONIX) 40 MG tablet Take 1 tablet (40 mg total) by mouth daily. Patient not taking: No sig reported 04/03/16   Cleora Fleet, MD    Physical Exam: Vitals:   10/24/20 2230 10/24/20 2245 10/24/20 2300 10/24/20 2315  BP: 90/72 99/73 100/75   Pulse: 80 93 100   Resp: 15 17 16 16   Temp:      TempSrc:      SpO2: 95% 98% 94%    Constitutional: Acutely and chronically ill-appearing male appearing diaphoretic. Head: Deformity s/p craniectomy noted with no bulging. Eyes: Lids and conjunctivae normal, PERRL ENMT: Mucous membranes are tacky. Posterior pharynx clear of any exudate or lesions.  Neck: normal, supple, no masses, no thyromegaly Respiratory: Non-labored breathing supplemental oxygen, unable to listen posteriorly but has minimal crac  without accessory muscle use. Clear*** breath sounds to auscultation bilaterally Cardiovascular: Regular rate and rhythm, no murmurs, rubs, or gallops. *** carotid bruits. *** JVD. *** LE edema. *** pedal pulses. Abdomen: Normoactive*** bowel sounds. No tenderness, non-distended, and no masses palpated. No hepatosplenomegaly. GU: *** indwelling catheter Musculoskeletal: *** clubbing / cyanosis. No joint deformity upper and lower extremities. Good ROM, no contractures. Normal muscle tone.  Skin: Warm, dry. No rashes, wounds, *** ulcers. No significant lesions noted.  Neurologic: CN II-XII grossly intact. Gait ***. Speech normal***. No focal deficits in motor strength or sensation in all extremities. DTRs ***.  Psychiatric: Alert and oriented x3. Normal judgment and insight. Mood *** with *** affect.   Labs on Admission: I have personally reviewed following labs and imaging studies  CBC: Recent Labs  Lab 10/24/20 1652  WBC 14.5*  NEUTROABS 12.8*  HGB 13.1  HCT 42.8  MCV 96.0  PLT 210   Basic Metabolic Panel: Recent Labs  Lab 10/24/20 1652  NA 144  K 4.3  CL 104  CO2 24  GLUCOSE 139*  BUN 54*  CREATININE 2.55*  CALCIUM 8.7*   GFR: CrCl cannot be calculated (Unknown ideal weight.). Liver Function Tests: Recent Labs  Lab 10/24/20 1652  AST 33  ALT 17  ALKPHOS 139*  BILITOT 1.2  PROT 8.3*  ALBUMIN 3.5   No results for input(s): LIPASE, AMYLASE in the last 168 hours. No results for input(s): AMMONIA in the last 168 hours. Coagulation Profile: No results for input(s): INR, PROTIME in the last 168 hours. Cardiac Enzymes: No results for input(s): CKTOTAL, CKMB, CKMBINDEX, TROPONINI in the last 168 hours. BNP (last 3 results) No results for input(s): PROBNP in the last 8760 hours. HbA1C: No results for input(s): HGBA1C in the last 72 hours. CBG: No results for input(s): GLUCAP in the last 168 hours. Lipid Profile: Recent Labs    10/24/20 1652  TRIG 63   Thyroid  Function Tests: No results for input(s): TSH, T4TOTAL, FREET4, T3FREE, THYROIDAB in the last 72 hours. Anemia Panel: Recent Labs    10/24/20 1652  FERRITIN 32   Urine analysis:    Component Value Date/Time   COLORURINE AMBER (A) 03/29/2016 2011   APPEARANCEUR CLOUDY (A) 03/29/2016 2011   LABSPEC 1.025 03/29/2016 2011   PHURINE 5.0 03/29/2016 2011   GLUCOSEU NEGATIVE  03/29/2016 2011   HGBUR NEGATIVE 03/29/2016 2011   BILIRUBINUR SMALL (A) 03/29/2016 2011   KETONESUR 15 (A) 03/29/2016 2011   PROTEINUR NEGATIVE 03/29/2016 2011   NITRITE NEGATIVE 03/29/2016 2011   LEUKOCYTESUR TRACE (A) 03/29/2016 2011    Recent Results (from the past 240 hour(s))  Resp Panel by RT-PCR (Flu A&B, Covid) Nasopharyngeal Swab     Status: Abnormal   Collection Time: 10/24/20  4:52 PM   Specimen: Nasopharyngeal Swab; Nasopharyngeal(NP) swabs in vial transport medium  Result Value Ref Range Status   SARS Coronavirus 2 by RT PCR POSITIVE (A) NEGATIVE Final    Comment: RESULT CALLED TO, READ BACK BY AND VERIFIED WITH: GREEN F. 01.03.22 @ 2046 BY MECIAL J. (NOTE) SARS-CoV-2 target nucleic acids are DETECTED.  The SARS-CoV-2 RNA is generally detectable in upper respiratory specimens during the acute phase of infection. Positive results are indicative of the presence of the identified virus, but do not rule out bacterial infection or co-infection with other pathogens not detected by the test. Clinical correlation with patient history and other diagnostic information is necessary to determine patient infection status. The expected result is Negative.  Fact Sheet for Patients: EntrepreneurPulse.com.au  Fact Sheet for Healthcare Providers: IncredibleEmployment.be  This test is not yet approved or cleared by the [place] FDA and  has been authorized for detection and/or diagnosis of SARS-CoV-2 by FDA under an Emergency Use Authorization (EUA).  This EUA  will remain in effect (meaning this test ca n be used) for the duration of  the COVID-19 declaration under Section 564(b)(1) of the Act, 21 U.S.C. section 360bbb-3(b)(1), unless the authorization is terminated or revoked sooner.     Influenza A by PCR NEGATIVE NEGATIVE Final   Influenza B by PCR NEGATIVE NEGATIVE Final    Comment: (NOTE) The Xpert Xpress SARS-CoV-2/FLU/RSV plus assay is intended as an aid in the diagnosis of influenza from Nasopharyngeal swab specimens and should not be used as a sole basis for treatment. Nasal washings and aspirates are unacceptable for Xpert Xpress SARS-CoV-2/FLU/RSV testing.  Fact Sheet for Patients: EntrepreneurPulse.com.au  Fact Sheet for Healthcare Providers: IncredibleEmployment.be  This test is not yet approved or cleared by the [place] FDA and has been authorized for detection and/or diagnosis of SARS-CoV-2 by FDA under an Emergency Use Authorization (EUA). This EUA will remain in effect (meaning this test can be used) for the duration of the COVID-19 declaration under Section 564(b)(1) of the Act, 21 U.S.C. section 360bbb-3(b)(1), unless the authorization is terminated or revoked.  Performed at [facility], [place] [address], [place], [place] [address]   C Difficile Quick Screen w PCR reflex     Status: None   Collection Time: 10/24/20 10:09 PM   Specimen: STOOL  Result Value Ref Range Status   C Diff antigen NEGATIVE NEGATIVE Final   C Diff toxin NEGATIVE NEGATIVE Final   C Diff interpretation No C. difficile detected.  Final    Comment: Performed at [facility], [place] [address], [place], [place] [address]     Radiological Exams on Admission: DG Chest Port 1 View  Result Date: 10/24/2020 CLINICAL DATA:  70 year old male with positive COVID-19 and hypoxia. EXAM: PORTABLE CHEST 1 VIEW COMPARISON:  Chest radiograph dated 03/30/2016. FINDINGS: Diffuse  interstitial coarsening. Bilateral mid to lower lung field streaky densities, likely atelectasis. Developing infiltrate is not excluded. No lobar consolidation, pleural effusion or pneumothorax. The right costophrenic angle is obscured due to superimposed support device. Stable  cardiomegaly. Hiatal hernia. No acute osseous pathology. IMPRESSION: Bilateral mid to lower lung field streaky densities, likely atelectasis. Developing infiltrate is not excluded. Electronically Signed   By: Elgie Collard M.D.   On: 10/24/2020 17:59   DG Abd 2 Views  Result Date: 10/24/2020 CLINICAL DATA:  COVID positive with low oxygen saturation and abdominal distension. EXAM: ABDOMEN - 2 VIEW COMPARISON:  None. FINDINGS: The bowel gas pattern is normal. A large amount of stool is seen within the distal sigmoid colon. There is no evidence of free air. No radio-opaque calculi or other significant radiographic abnormality is seen. IMPRESSION: 1. No evidence of bowel obstruction. Electronically Signed   By: Aram Candela M.D.   On: 10/24/2020 21:42   [place] EKG SITE RITE  Result Date: 10/24/2020 If Site Rite image not attached, placement could not be confirmed due to current cardiac rhythm.   EKG: Independently reviewed. ***  Assessment/Plan Active Problems:   Severe sepsis (HCC)     ***  DVT prophylaxis: ***  Code Status: ***  Family Communication: *** Disposition Plan: *** Consults called: ***  Admission status: ***    Tyrone Nine, MD [facility] [email or web address] 10/24/2020, 11:44 PM

## 2020-10-24 NOTE — H&P (Signed)
History and Physical   Austin Hartman GEZ:[id number] DOB: Mar 30, 1951 DOA: 10/24/2020  Referring MD/NP/PA: Dr. Stevie Kern, EDP PCP: Galvin Proffer, MD  Patient coming from: Long term care facility  Chief Complaint: AMS,   HPI: Austin Hartman is a 70 y.o. male with a history of TBI, CVA, chronic SDH, s/p craniectomy, epilepsy, and recent covid-19 diagnosis who presented from LTC facility with altered mental status, cough, hypoxia to 70%'s and hypothermia on 10/24/2020. History obtained from records from facility, EDP, and family due to patient's altered mentation.   ED Course: He appeared ill, hypotensive, hypothermic with declining mental status, found to have leukocytosis, ARF, and has had copious diarrhea with negative C. diff testing. EDP discussed poor prognosis with patient's sister who is HCPOA. Visitation offered, confirmed DNR and desire to avoid invasive procedures including central lines and ventilator. IV fluids, antibiotics have been given with slight improvement in blood pressure. Pt admitted to SDU. Palliative care consulted.   Review of Systems: Unable to obtain due to confusion. Otherwise per HPI.   Past Medical History:  Diagnosis Date  . Abdominal pain, generalized 03/29/2016  . Altered mental state 03/29/2016  . At high risk for aspiration 03/29/2016  . CHF (congestive heart failure) (HCC)   . Chronic constipation 03/29/2016  . Coronary artery disease   . Diarrhea   . DNR (do not resuscitate) 03/29/2016  . Dysphagia   . Epilepsy (HCC) 03/29/2016  . Essential hypertension   . Hemiplegia (HCC) 12/13/2011  . History of traumatic brain injury   . Hyperlipidemia 06/30/2015  . Hypertension   . Hypotension, unspecified 03/29/2016  . Hypothermia 03/29/2016  . Left hemiparesis (HCC) 03/29/2016  . Leukocytosis 03/29/2016  . 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 Partial  epilepsy with impairment of consciousness, intractable (HCC) 12/13/2011  . Sepsis (HCC)   . Stroke (HCC)   . TBI (traumatic brain injury) (HCC)   . Typical atrial flutter (HCC) 05/07/2016   Past Surgical History:  Procedure Laterality Date  . BRAIN SURGERY    . CARDIAC SURGERY    . CHOLECYSTECTOMY    . CORONARY ANGIOPLASTY WITH STENT PLACEMENT    . HERNIA REPAIR     - Nonsmoker, no EtOH or illicit drugs, has been long term care resident for 12 years.    reports that he has never smoked. He has never used smokeless tobacco. He reports that he does not drink alcohol and does not use drugs. Allergies  Allergen Reactions  . Acetaminophen Other (See Comments)    unknown  . Azithromycin Other (See Comments)    unknown  . Biaxin [Clarithromycin] Other (See Comments)    unknown  . Diltiazem Other (See Comments)    unknown  . Other Other (See Comments)    Darvocet-N - unknown  . Verapamil Other (See Comments)    unknown   Family History  Problem Relation Age of Onset  . Hypertension Mother   . Lung cancer Mother   . Heart attack Father   . Hypertension Father   . Hyperlipidemia Father    - Family history otherwise reviewed and not pertinent.  Prior to Admission medications   Medication Sig Start Date End Date Taking? Authorizing Provider  acetaminophen (TYLENOL) 325 MG tablet Take 650 mg by mouth every 6 (six) hours as needed for moderate pain.   Yes [provider]  amiodarone (PACERONE) 200 MG tablet Take 200 mg by mouth  daily.    Yes [provider]  ammonium lactate (AMLACTIN) 12 % cream Apply 1 g topically at bedtime. Both legs/feet   Yes [provider]  aspirin EC 81 MG tablet Take 81 mg by mouth daily.   Yes [provider]  atorvastatin (LIPITOR) 20 MG tablet Take 20 mg by mouth daily.   Yes [provider]  bethanechol (URECHOLINE) 10 MG tablet Take 10 mg by mouth 3 (three) times daily.    Yes [provider]   bisacodyl (DULCOLAX) 10 MG suppository Place 10 mg rectally 2 (two) times daily as needed for moderate constipation.   Yes [provider]  cloBAZam (ONFI) 10 MG tablet Take 40 mg by mouth daily.   Yes [provider]  clopidogrel (PLAVIX) 75 MG tablet Take 75 mg by mouth daily.   Yes [provider]  Cranberry 450 MG CAPS Take 450 mg by mouth daily.   Yes [provider]  digoxin (LANOXIN) 0.125 MG tablet Take 125 mcg daily by mouth.    Yes [provider]  escitalopram (LEXAPRO) 10 MG tablet Take 10 mg by mouth daily.   Yes [provider]  finasteride (PROSCAR) 5 MG tablet Take 5 mg by mouth daily.   Yes [provider]  gabapentin (NEURONTIN) 300 MG capsule Take 300 mg by mouth at bedtime.   Yes [provider]  guaifenesin (HUMIBID E) 400 MG TABS tablet Take 400 mg by mouth 2 (two) times daily.   Yes [provider]  lamoTRIgine (LAMICTAL) 200 MG tablet Take 400 mg by mouth 2 (two) times daily.   Yes [provider]  levETIRAcetam (KEPPRA) 1000 MG tablet Take 1,000 mg by mouth 2 (two) times daily.    Yes [provider]  levETIRAcetam (KEPPRA) 250 MG tablet Take 250 mg by mouth daily. Takes along with 1000 mg tablet to equal 1250 mg in the morning.   Yes [provider]  Levothyroxine Sodium 112 MCG CAPS Take 112 mcg by mouth daily before breakfast.    Yes [provider]  linaclotide (LINZESS) 290 MCG CAPS capsule Take 290 mcg by mouth daily.   Yes [provider]  loratadine (CLARITIN) 10 MG tablet Take 10 mg by mouth daily.   Yes [provider]  Magnesium Hydroxide (MILK OF MAGNESIA PO) Take 30 mLs by mouth daily as needed (constipation).   Yes [provider]  meloxicam (MOBIC) 15 MG tablet Take 15 mg by mouth daily.   Yes [provider]  Menthol, Topical Analgesic, (BIOFREEZE) 4 % GEL Apply 1 application topically 2 (two) times daily.    Yes [provider]  metoprolol tartrate (LOPRESSOR) 25 MG tablet Take 12.5 mg by mouth 3 (three) times daily.    Yes [provider]  montelukast (SINGULAIR) 10 MG tablet Take 10 mg by mouth at bedtime.   Yes [provider]  Multiple Vitamins-Minerals (MULTIVITAMIN PO) Take 1 tablet by mouth daily.   Yes [provider]  pantoprazole (PROTONIX) 20 MG tablet Take 20 mg by mouth daily.   Yes [provider]  polyethylene glycol (MIRALAX / GLYCOLAX) packet Take 17 g by mouth daily.   Yes [provider]  sennosides-docusate sodium (SENOKOT-S) 8.6-50 MG tablet Take 2 tablets by mouth 2 (two) times daily.   Yes [provider]  sodium fluoride (PREVIDENT 5000 PLUS) 1.1 % CREA dental cream Place 1 application onto teeth 2 (two) times daily.   Yes [provider]  tamsulosin (FLOMAX) 0.4 MG CAPS capsule Take 0.8 mg by mouth daily after supper.   Yes [provider]  torsemide (DEMADEX) 20 MG tablet Take 1 tablet (20 mg total) 2 (two) times daily by mouth. Patient taking differently: Take 40 mg by mouth 2 (two) times daily. 09/05/17  Yes Baldo Daub, MD  pantoprazole (PROTONIX) 40 MG tablet Take 1 tablet (40 mg total) by mouth daily. Patient not taking: No sig reported 04/03/16   Cleora Fleet, MD    Physical Exam: Vitals:   10/24/20 2230 10/24/20 2245 10/24/20 2300 10/24/20 2315  BP: 90/72 99/73 100/75   Pulse: 80 93 100   Resp: 15 17 16 16   Temp:      TempSrc:      SpO2: 95% 98% 94%    Constitutional: Acutely and chronically ill-appearing male appearing diaphoretic. Head: Deformity s/p craniectomy noted with no bulging. Eyes: Lids and conjunctivae normal, PERRL ENMT: Mucous membranes are tacky. Posterior pharynx clear of any exudate or lesions.  Neck: normal, supple, no masses, no thyromegaly Respiratory: Non-labored breathing supplemental oxygen, unable to listen posteriorly but has minimal crackles  anteriorly. Cardiovascular: Regular rate and rhythm, no murmurs, rubs, or gallops. No carotid bruits. No definite JVD. Nonpitting diffuse R > L LE edema. Palpable pedal pulses. Abdomen: Normoactive bowel sounds. No tenderness, non-distended, and no masses palpated. No hepatosplenomegaly. GU: No indwelling catheter Musculoskeletal: No clubbing / cyanosis. No joint deformity upper and lower extremities. Skin: Warm, diaphoretic. No rashes, wounds, or ulcers on visualized skin. Neurologic: Not cooperative with full exam. Speech abnormal (unknown baseline), moves all extremities but only minimally, follows some commands.   Psychiatric: Alert and not oriented.    Labs on Admission: I have personally reviewed following labs and imaging studies  CBC: Recent Labs  Lab 10/24/20 1652  WBC 14.5*  NEUTROABS 12.8*  HGB 13.1  HCT 42.8  MCV 96.0  PLT 210   Basic Metabolic Panel: Recent Labs  Lab 10/24/20 1652  NA 144  K 4.3  CL 104  CO2 24  GLUCOSE 139*  BUN 54*  CREATININE 2.55*  CALCIUM 8.7*   GFR: CrCl cannot be calculated (Unknown ideal weight.). Liver Function Tests: Recent Labs  Lab 10/24/20 1652  AST 33  ALT 17  ALKPHOS 139*  BILITOT 1.2  PROT 8.3*  ALBUMIN 3.5   No results for input(s): LIPASE, AMYLASE in the last 168 hours. No results for input(s): AMMONIA in the last 168 hours. Coagulation Profile: No results for input(s): INR, PROTIME in the last 168 hours. Cardiac Enzymes: No results for input(s): CKTOTAL, CKMB, CKMBINDEX, TROPONINI in the last 168 hours. BNP (last 3 results) No results for input(s): PROBNP in the last 8760 hours. HbA1C: No results for input(s): HGBA1C in the last 72 hours. CBG: No results for input(s): GLUCAP in the last 168 hours. Lipid Profile: Recent Labs    10/24/20 1652  TRIG 63   Thyroid Function Tests: No results for input(s): TSH, T4TOTAL, FREET4, T3FREE, THYROIDAB in the last 72 hours. Anemia Panel: Recent Labs     10/24/20 1652  FERRITIN 32   Urine analysis:    Component Value Date/Time   COLORURINE AMBER (A) 03/29/2016 2011   APPEARANCEUR CLOUDY (A) 03/29/2016 2011   LABSPEC 1.025 03/29/2016 2011   PHURINE 5.0 03/29/2016 2011   GLUCOSEU NEGATIVE 03/29/2016 2011   HGBUR NEGATIVE 03/29/2016 2011   BILIRUBINUR SMALL (A) 03/29/2016 2011   KETONESUR 15 (A) 03/29/2016 2011  PROTEINUR NEGATIVE 03/29/2016 2011   NITRITE NEGATIVE 03/29/2016 2011   LEUKOCYTESUR TRACE (A) 03/29/2016 2011    Recent Results (from the past 240 hour(s))  Resp Panel by RT-PCR (Flu A&B, Covid) Nasopharyngeal Swab     Status: Abnormal   Collection Time: 10/24/20  4:52 PM   Specimen: Nasopharyngeal Swab; Nasopharyngeal(NP) swabs in vial transport medium  Result Value Ref Range Status   SARS Coronavirus 2 by RT PCR POSITIVE (A) NEGATIVE Final    Comment: RESULT CALLED TO, READ BACK BY AND VERIFIED WITH: GREEN F. 01.03.22 @ 2046 BY MECIAL J. (NOTE) SARS-CoV-2 target nucleic acids are DETECTED.  The SARS-CoV-2 RNA is generally detectable in upper respiratory specimens during the acute phase of infection. Positive results are indicative of the presence of the identified virus, but do not rule out bacterial infection or co-infection with other pathogens not detected by the test. Clinical correlation with patient history and other diagnostic information is necessary to determine patient infection status. The expected result is Negative.  Fact Sheet for Patients: BloggerCourse.com  Fact Sheet for Healthcare Providers: SeriousBroker.it  This test is not yet approved or cleared by the [place] FDA and  has been authorized for detection and/or diagnosis of SARS-CoV-2 by FDA under an Emergency Use Authorization (EUA).  This EUA will remain in effect (meaning this test ca n be used) for the duration of  the COVID-19 declaration under Section 564(b)(1) of the Act,  21 U.S.C. section 360bbb-3(b)(1), unless the authorization is terminated or revoked sooner.     Influenza A by PCR NEGATIVE NEGATIVE Final   Influenza B by PCR NEGATIVE NEGATIVE Final    Comment: (NOTE) The Xpert Xpress SARS-CoV-2/FLU/RSV plus assay is intended as an aid in the diagnosis of influenza from Nasopharyngeal swab specimens and should not be used as a sole basis for treatment. Nasal washings and aspirates are unacceptable for Xpert Xpress SARS-CoV-2/FLU/RSV testing.  Fact Sheet for Patients: BloggerCourse.com  Fact Sheet for Healthcare Providers: SeriousBroker.it  This test is not yet approved or cleared by the [place] FDA and has been authorized for detection and/or diagnosis of SARS-CoV-2 by FDA under an Emergency Use Authorization (EUA). This EUA will remain in effect (meaning this test can be used) for the duration of the COVID-19 declaration under Section 564(b)(1) of the Act, 21 U.S.C. section 360bbb-3(b)(1), unless the authorization is terminated or revoked.  Performed at [facility], [address], [place], [place] [address]   C Difficile Quick Screen w PCR reflex     Status: None   Collection Time: 10/24/20 10:09 PM   Specimen: STOOL  Result Value Ref Range Status   C Diff antigen NEGATIVE NEGATIVE Final   C Diff toxin NEGATIVE NEGATIVE Final   C Diff interpretation No C. difficile detected.  Final    Comment: Performed at [facility], [address] [place], [place] [address]     Radiological Exams on Admission: DG Chest Port 1 View  Result Date: 10/24/2020 CLINICAL DATA:  70 year old male with positive COVID-19 and hypoxia. EXAM: PORTABLE CHEST 1 VIEW COMPARISON:  Chest radiograph dated 03/30/2016. FINDINGS: Diffuse interstitial coarsening. Bilateral mid to lower lung field streaky densities, likely atelectasis. Developing infiltrate is not excluded. No  lobar consolidation, pleural effusion or pneumothorax. The right costophrenic angle is obscured due to superimposed support device. Stable cardiomegaly. Hiatal hernia. No acute osseous pathology. IMPRESSION: Bilateral mid to lower lung field streaky densities, likely atelectasis. Developing infiltrate is not excluded. Electronically  Signed   By: Anner Crete M.D.   On: 10/24/2020 17:59   DG Abd 2 Views  Result Date: 10/24/2020 CLINICAL DATA:  COVID positive with low oxygen saturation and abdominal distension. EXAM: ABDOMEN - 2 VIEW COMPARISON:  None. FINDINGS: The bowel gas pattern is normal. A large amount of stool is seen within the distal sigmoid colon. There is no evidence of free air. No radio-opaque calculi or other significant radiographic abnormality is seen. IMPRESSION: 1. No evidence of bowel obstruction. Electronically Signed   By: Virgina Norfolk M.D.   On: 10/24/2020 21:42   [place] EKG SITE RITE  Result Date: 10/24/2020 If Site Rite image not attached, placement could not be confirmed due to current cardiac rhythm.   EKG: Independently reviewed. IVCD, RBBB. No ST elevation  Assessment/Plan Principal Problem:   Severe sepsis (HCC) Active Problems:   Dysphagia   Coronary artery disease involving native coronary artery of native heart with angina pectoris (HCC)   Diarrhea   Hypertensive heart disease with heart failure (HCC)   Chronic constipation   Hypothermia   Epilepsy (HCC)   Hypotension, unspecified   At high risk for aspiration   DNR (do not resuscitate)   Leukocytosis   Hyperlipidemia   Chronic atrial fibrillation (HCC)   Subdural hematoma, chronic (HCC)  Lactic acidosis, severe sepsis: Unclear primary bacterial etiology though with elevated PCT, leukocytosis, and hypotension, bacterial sepsis is felt to be more likely than viral sepsis at this time.  - IV fluids gtt continue. With improvement in BP and hx CHF, will hold repeat boluses at this time.  - s/p  vancomycin and zosyn which we will continue empirically with patient's risk for aspiration and MRSA colonization, pending culture data (unfortunately these were drawn after abx)   Acute renal failure:  - IVF - Hold meloxicam which the patient has been receiving daily scheduled. - Monitor BMP, I/O; bladder scan to r/o obstruction w/BPH hx  Breakthrough covid-19 infection: With history of vaccination x3, latest in December. Positive test 12/30.  - Remdesivir unlikely to be of significant benefit in this setting and pt has severe renal failure.  - Without infiltrates, and with concern for bacterial sepsis, will also hold on steroids for now. Monitor inflammatory markers closely.  - Repeat CXR in AM. - Antitussives, antihistamines, singulair - Venous doppler U/S r/o DVT w/asymmetric edema and elevated d-dimer.   History of TBI, chronic subdural hematoma, craniectomy:  - Will avoid anticoagulants in this setting despite high risk of VTE.   Acute metabolic encephalopathy: Likely related to sepsis.  - Monitor with treatment as above - CT head pending, hx CVA. - SLP evaluation.  Seizure disorder: Pt has remained responsive with no stereotyped movements during ED stay. MAR sent from facility reports AEDs were given as scheduled today. - Continue AEDs including gabapentin 300mg  daily, keppra 1250mg  qAM, 1000mg  qHS, clobazam 40mg  daily, lamictal 400mg  BID  HTN, chronic HFpEF, chronic atrial fibrillation/atrial flutter, HLD: - Amiodarone 200mg  daily - Digoxin 185mcg daily, level pending - Hold torsemide 40mg  BID (given this AM) - Hold metoprolol 12.5mg  TID due to hypotension - Hold statin - ASA, plavix  CAD:  - Continue aspirin 81mg , plavix, hold statin and BB as above.  BPH:  - Continue flomax, finasteride, bethanechol  Depression:  - SSRI  Hypothyroidism:  - Continue synthroid 124mcg  Goals of care discussion: At this point the patient, who is chronically ill at long term care  facility for 12 years with poor functional status (  i.e. limited physiologic reserve), now has acute covid-19 infection, severe sepsis, and multiorgan failure (ARF, encephalopathy, hypoxia). Prognosis is exceedingly grim. We have confirmed HCPOA's understanding of this and wishes to avoid invasive procedures.   DVT prophylaxis: SCDs  Code Status: DNR confirmed. Has been DNR since at least 2017  Family Communication: Sister by phone Disposition Plan: Uncertain, guarded prognosis Consults called: Palliative care  Admission status: Inpatient    Tyrone Nineyan B Jevan Gaunt, MD [facility] [email or web address] 10/25/2020, 12:49 AM

## 2020-10-24 NOTE — Progress Notes (Signed)
A consult was received from an ED physician for Vancomycin per pharmacy dosing.  The patient's profile has been reviewed for ht/wt/allergies/indication/available labs.   A one time order has been placed for Vancomycin 2gm IV.  Further antibiotics/pharmacy consults should be ordered by admitting physician if indicated.                       Thank you, Junita Push PharmD 10/24/2020  5:44 PM

## 2020-10-24 NOTE — ED Triage Notes (Signed)
Patient reports to the ER from [facility] for COVID positive with low O2 sats.  Patient is on 1L [place] with EMS and his O2 sats are at 94%.  Patient's facility reported that he was on 10L [place] and was in the 70's and could not speak and his temp was 93.1  EMS reports patient is alert and oriented and able to speak and answer questions and his temperature was WDL.

## 2020-10-24 NOTE — ED Provider Notes (Signed)
[facility]-EMERGENCY DEPT Provider Note   CSN: [id number] Arrival date & time: 10/24/20  1533     History No chief complaint on file.   Austin Hartman is a 70 y.o. male.  The history is provided by the patient and medical records. No language interpreter was used.  Shortness of Breath Severity:  Severe Onset quality:  Gradual Timing:  Intermittent Progression:  Waxing and waning Chronicity:  New Context: URI   Relieved by:  Nothing Worsened by:  Nothing Ineffective treatments:  None tried Associated symptoms: cough, fever and sputum production   Associated symptoms: no abdominal pain, no chest pain, no diaphoresis, no headaches, no vomiting and no wheezing   Risk factors: obesity        Past Medical History:  Diagnosis Date  . Abdominal pain, generalized 03/29/2016  . Altered mental state 03/29/2016  . At high risk for aspiration 03/29/2016  . CHF (congestive heart failure) (HCC)   . Chronic constipation 03/29/2016  . Coronary artery disease   . Diarrhea   . DNR (do not resuscitate) 03/29/2016  . Dysphagia   . Epilepsy (HCC) 03/29/2016  . Essential hypertension   . Hemiplegia (HCC) 12/13/2011  . History of traumatic brain injury   . Hyperlipidemia 06/30/2015  . Hypertension   . Hypotension, unspecified 03/29/2016  . Hypothermia 03/29/2016  . Left hemiparesis (HCC) 03/29/2016  . Leukocytosis 03/29/2016  . 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 Partial epilepsy with impairment of consciousness, intractable (HCC) 12/13/2011  . Sepsis (HCC)   . Stroke (HCC)   . TBI (traumatic brain injury) (HCC)   . Typical atrial flutter (HCC) 05/07/2016    Patient Active Problem List   Diagnosis Date Noted  . On amiodarone therapy 01/21/2018  . Chronic atrial fibrillation (HCC) 06/06/2017  . High risk medication use 06/06/2017  . Subdural  hematoma, chronic (HCC) 06/06/2017  . 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 Typical atrial flutter (HCC) 05/07/2016  . Chronic constipation 03/29/2016  . Altered mental state 03/29/2016  . Hypothermia 03/29/2016  . Epilepsy (HCC) 03/29/2016  . Abdominal pain, generalized 03/29/2016  . Hypotension, unspecified 03/29/2016  . Left hemiparesis (HCC) 03/29/2016  . At high risk for aspiration 03/29/2016  . DNR (do not resuscitate) 03/29/2016  . Leukocytosis 03/29/2016  . TBI (traumatic brain injury) (HCC)   . Dysphagia   . 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 Diarrhea   . Hypertensive heart disease with heart failure (HCC)   . History of traumatic brain injury   . Sepsis (HCC)   . Hyperlipidemia 06/30/2015  . Hemiplegia (HCC) 12/13/2011  . Partial epilepsy with impairment of consciousness, intractable (HCC) 12/13/2011    Past Surgical History:  Procedure Laterality Date  . BRAIN SURGERY    . CARDIAC SURGERY    . CHOLECYSTECTOMY    . CORONARY ANGIOPLASTY WITH STENT PLACEMENT    . HERNIA REPAIR         Family History  Problem Relation Age of Onset  . Hypertension Mother   . Lung cancer Mother   . Heart attack Father   . Hypertension Father   . Hyperlipidemia Father     Social History   Tobacco Use  . Smoking status: Never Smoker  . Smokeless tobacco: Never Used  Vaping Use  . Vaping Use: Never used  Substance Use Topics  . Alcohol use: No  . Drug use: No    Home Medications Prior to Admission medications   Medication Sig Start Date End Date Taking? Authorizing Provider  acetaminophen (TYLENOL) 325 MG tablet Take 650 mg by mouth every 6 (six) hours as needed. Patient not taking: Reported on 08/15/2020    [provider]  amiodarone (PACERONE) 200 MG tablet  Take 200 mg by mouth daily.     [provider]  ammonium lactate (AMLACTIN) 12 % cream Apply topically as needed for dry skin. Both legs/feet    [provider]  aspirin EC 81 MG tablet Take 81 mg by mouth daily.    [provider]  atorvastatin (LIPITOR) 20 MG tablet Take 20 mg by mouth daily.    [provider]  bethanechol (URECHOLINE) 10 MG tablet Take 10 mg by mouth 3 (three) times daily.     [provider]  bisacodyl (DULCOLAX) 10 MG suppository Place 10 mg rectally as needed for moderate constipation.    [provider]  cloBAZam (ONFI) 20 MG tablet Take 40 mg by mouth every evening.    [provider]  clopidogrel (PLAVIX) 75 MG tablet Take 75 mg by mouth daily.    [provider]  Cranberry 450 MG CAPS Take 450 mg by mouth daily.    [provider]  digoxin (LANOXIN) 0.125 MG tablet Take 125 mcg daily by mouth.     [provider]  escitalopram (LEXAPRO) 10 MG tablet Take 10 mg by mouth daily.    [provider]  finasteride (PROSCAR) 5 MG tablet Take 5 mg by mouth daily.    [provider]  gabapentin (NEURONTIN) 300 MG capsule Take 300 mg by mouth at bedtime.    [provider]  lamoTRIgine (LAMICTAL) 200 MG tablet Take 400 mg by mouth 2 (two) times daily.    [provider]  levETIRAcetam (KEPPRA) 1000 MG tablet Take 1,000 mg by mouth 2 (two) times daily.     [provider]  levETIRAcetam (KEPPRA) 250 MG tablet Take 250 mg by mouth daily.     [provider]  Levothyroxine Sodium 112 MCG CAPS Take 112 mcg by mouth daily before breakfast.     [provider]  linaclotide (LINZESS) 290 MCG CAPS capsule Take 290 mcg by mouth daily before breakfast.     [provider]  loratadine (CLARITIN) 10 MG tablet Take 10 mg by mouth daily as needed for allergies.    [provider]  magnesium hydroxide (MILK OF MAGNESIA) 400  MG/5ML suspension Take 30 mLs by mouth daily as needed for mild constipation.    [provider]  metoprolol tartrate (LOPRESSOR) 25 MG tablet Take 12.5 mg by mouth 3 (three) times daily.     [provider]  montelukast (SINGULAIR) 10 MG tablet Take 10 mg by mouth at bedtime.    [provider]  Multiple Vitamins-Minerals (MULTIVITAMIN PO) Take 1 tablet by mouth daily.    [provider]  pantoprazole (PROTONIX) 40 MG tablet Take 1 tablet (40 mg total) by mouth daily. Patient taking differently: Take 20 mg daily by mouth.  04/03/16   Johnson, Clanford L, MD  polyethylene glycol (MIRALAX / GLYCOLAX) packet Take 17 g by mouth daily as needed for mild constipation.    [provider]  sennosides-docusate sodium (SENOKOT-S) 8.6-50 MG tablet Take 2 tablets by  mouth daily.    [provider]  sodium fluoride (PREVIDENT 5000 PLUS) 1.1 % CREA dental cream Place 1 application onto teeth 2 (two) times daily.    [provider]  tamsulosin (FLOMAX) 0.4 MG CAPS capsule Take 0.4 mg by mouth daily after supper.    [provider]  torsemide (DEMADEX) 20 MG tablet Take 1 tablet (20 mg total) 2 (two) times daily by mouth. Patient taking differently: Take 40 mg by mouth 2 (two) times daily.  09/05/17   Richardo Priest, MD    Allergies    Acetaminophen, Azithromycin, Biaxin [clarithromycin], Diltiazem, and Verapamil  Review of Systems   Review of Systems  Constitutional: Positive for chills, fatigue and fever. Negative for diaphoresis.  HENT: Negative for congestion.   Respiratory: Positive for cough, sputum production and shortness of breath. Negative for apnea, chest tightness and wheezing.   Cardiovascular: Negative for chest pain and palpitations.  Gastrointestinal: Positive for diarrhea. Negative for abdominal pain, constipation, nausea and vomiting.  Genitourinary: Negative for dysuria and flank pain.  Musculoskeletal: Negative for  back pain.  Neurological: Negative for headaches.  All other systems reviewed and are negative.   Physical Exam Updated Vital Signs BP (!) 93/47 (BP Location: Right Arm)   Pulse 86   Temp (!) 93.8 F (34.3 C) (Rectal)   Resp (!) 26   SpO2 92%   Physical Exam Vitals and nursing note reviewed.  Constitutional:      General: He is not in acute distress.    Appearance: He is well-developed and well-nourished. He is obese. He is not ill-appearing, toxic-appearing or diaphoretic.  HENT:     Head: Normocephalic and atraumatic.     Nose: No congestion or rhinorrhea.  Eyes:     Conjunctiva/sclera: Conjunctivae normal.     Pupils: Pupils are equal, round, and reactive to light.  Cardiovascular:     Rate and Rhythm: Normal rate and regular rhythm.     Pulses: Normal pulses.     Heart sounds: No murmur heard.   Pulmonary:     Effort: Pulmonary effort is normal. No respiratory distress.     Breath sounds: Rhonchi present. No wheezing or rales.  Chest:     Chest wall: No tenderness.  Abdominal:     General: Abdomen is flat. There is no distension.     Palpations: Abdomen is soft.     Tenderness: There is no abdominal tenderness. There is no guarding or rebound.  Musculoskeletal:        General: No tenderness.     Cervical back: Neck supple.     Right lower leg: Edema present.     Left lower leg: Edema present.  Skin:    General: Skin is warm and dry.     Capillary Refill: Capillary refill takes less than 2 seconds.     Findings: No erythema.  Neurological:     Mental Status: He is alert. Mental status is at baseline.  Psychiatric:        Mood and Affect: Mood and affect and mood normal.     ED Results / Procedures / Treatments   Labs (all labs ordered are listed, but only abnormal results are displayed) Labs Reviewed  RESP PANEL BY RT-PCR (FLU A&B, COVID) ARPGX2  CULTURE, BLOOD (ROUTINE X 2)  CULTURE, BLOOD (ROUTINE X 2)  LACTIC ACID, PLASMA  LACTIC ACID, PLASMA   CBC WITH DIFFERENTIAL/PLATELET  COMPREHENSIVE METABOLIC PANEL  D-DIMER, QUANTITATIVE (NOT AT [facility])  PROCALCITONIN  LACTATE DEHYDROGENASE  FERRITIN  TRIGLYCERIDES  FIBRINOGEN  C-REACTIVE PROTEIN  BRAIN NATRIURETIC PEPTIDE    EKG None  Radiology No results found.  Procedures Procedures (including critical care time)  Medications Ordered in ED Medications  sodium chloride 0.9 % bolus 500 mL (has no administration in time range)    ED Course  I have reviewed the triage vital signs and the nursing notes.  Pertinent labs & imaging results that were available during my care of the patient were reviewed by me and considered in my medical decision making (see chart for details).    MDM Rules/Calculators/A&P                          Adedamola Seto is a 70 y.o. male with a past medical history significant for CAD, CHF, hypertension, epilepsy, prior stroke, atrial flutter not anticoagulation with history of TBI, chronic subdural hematoma, and prior brain surgery who presents with altered mental status episode, hypoxia, cough, shortness of breath, diarrhea, malaise, reported hypothermia, and recent COVID-19 positive test.  According to EMS, patient was found to be cool with the temperature around 93 at the facility.  They also found him to have hypoxia in the 70s, and blood pressures that were soft.  When EMS evaluated him, his blood pressure was in the 90s but he was not cool on oral temperature evaluation and was not hypoxic.  They brought him to the emergency department.  On arrival, the patient now is hypothermic with a temperature of 93.8, bear hugger was ordered.  He is having soft pressures of 93 and tachypnea of 26.  He is having borderline hypoxia with oxygen saturations around 90 to 91% on 1 L.  We will increase his oxygen.  He is not tachycardic however.  On exam, he does have coarse breath sounds and had a large amount of diarrhea.  His abdomen is nontender and bowel sounds were  appreciated.  He is able to answer questions and is denying pain at this time.  He denies any trauma.  Denies any chest pain or palpitations.  Denies significant headache.  Clinically I am concerned about the patient's vital signs with the soft pressures, hypothermia, tachypnea, and hypoxia in the setting of new Covid diagnosis.  We will get screening labs, get a chest x-ray to look for pneumonia, and will get a head CT given his previous intracranial problems and this transient altered mental status and unresponsiveness.  We do not have a positive test in our system so we will test him to confirm Covid.  We will give gentle amount of fluids due to his history of heart failure and this tenuous oxygen saturations.  Anticipate admission due due to his altered mental status episode facility and his new hypoxia in the 70s reported however we will get work-up completed first.  Care transferred to oncoming team awaiting for work-up results.   Final Clinical Impression(s) / ED Diagnoses Final diagnoses:  COVID-19  Hypoxia  Altered mental status, unspecified altered mental status type    Clinical Impression: 1. COVID-19   2. Hypoxia   3. Altered mental status, unspecified altered mental status type     Disposition: Admit  This note was prepared with assistance of Dragon voice recognition software. Occasional wrong-word or sound-a-like substitutions may have occurred due to the inherent limitations of voice recognition software.      Shirelle Tootle, Canary Brim, MD 10/24/20 [id number]

## 2020-10-25 ENCOUNTER — Inpatient Hospital Stay (HOSPITAL_COMMUNITY): Payer: Medicare Other

## 2020-10-25 ENCOUNTER — Encounter (HOSPITAL_COMMUNITY): Payer: Self-pay | Admitting: Family Medicine

## 2020-10-25 DIAGNOSIS — R7989 Other specified abnormal findings of blood chemistry: Secondary | ICD-10-CM

## 2020-10-25 DIAGNOSIS — R6520 Severe sepsis without septic shock: Secondary | ICD-10-CM | POA: Diagnosis not present

## 2020-10-25 DIAGNOSIS — U071 COVID-19: Secondary | ICD-10-CM

## 2020-10-25 DIAGNOSIS — A419 Sepsis, unspecified organism: Secondary | ICD-10-CM | POA: Diagnosis not present

## 2020-10-25 LAB — CBC
HCT: 45.4 % (ref 39.0–52.0)
Hemoglobin: 13.5 g/dL (ref 13.0–17.0)
MCH: 29.3 pg (ref 26.0–34.0)
MCHC: 29.7 g/dL — ABNORMAL LOW (ref 30.0–36.0)
MCV: 98.7 fL (ref 80.0–100.0)
Platelets: 201 10*3/uL (ref 150–400)
RBC: 4.6 MIL/uL (ref 4.22–5.81)
RDW: 14.9 % (ref 11.5–15.5)
WBC: 14.7 10*3/uL — ABNORMAL HIGH (ref 4.0–10.5)
nRBC: 0 % (ref 0.0–0.2)

## 2020-10-25 LAB — COMPREHENSIVE METABOLIC PANEL
ALT: 19 U/L (ref 0–44)
AST: 39 U/L (ref 15–41)
Albumin: 3.3 g/dL — ABNORMAL LOW (ref 3.5–5.0)
Alkaline Phosphatase: 108 U/L (ref 38–126)
Anion gap: 18 — ABNORMAL HIGH (ref 5–15)
BUN: 60 mg/dL — ABNORMAL HIGH (ref 8–23)
CO2: 23 mmol/L (ref 22–32)
Calcium: 8 mg/dL — ABNORMAL LOW (ref 8.9–10.3)
Chloride: 106 mmol/L (ref 98–111)
Creatinine, Ser: 2.57 mg/dL — ABNORMAL HIGH (ref 0.61–1.24)
GFR, Estimated: 26 mL/min — ABNORMAL LOW (ref >60)
Glucose, Bld: 107 mg/dL — ABNORMAL HIGH (ref 70–99)
Potassium: 4.8 mmol/L (ref 3.5–5.1)
Sodium: 147 mmol/L — ABNORMAL HIGH (ref 135–145)
Total Bilirubin: 1.4 mg/dL — ABNORMAL HIGH (ref 0.3–1.2)
Total Protein: 7.5 g/dL (ref 6.5–8.1)

## 2020-10-25 LAB — PROCALCITONIN: Procalcitonin: 15.34 ng/mL

## 2020-10-25 LAB — LACTIC ACID, PLASMA: Lactic Acid, Venous: 4.2 mmol/L (ref 0.5–1.9)

## 2020-10-25 LAB — HIV ANTIBODY (ROUTINE TESTING W REFLEX): HIV Screen 4th Generation wRfx: NONREACTIVE

## 2020-10-25 LAB — MRSA PCR SCREENING: MRSA by PCR: NEGATIVE

## 2020-10-25 MED ORDER — CLOBAZAM 10 MG PO TABS
40.0000 mg | ORAL_TABLET | Freq: Every day | ORAL | Status: DC
Start: 2020-10-25 — End: 2020-11-02
  Administered 2020-10-28 – 2020-11-01 (×5): 40 mg via ORAL
  Filled 2020-10-25 (×6): qty 4

## 2020-10-25 MED ORDER — GUAIFENESIN 200 MG PO TABS
400.0000 mg | ORAL_TABLET | Freq: Two times a day (BID) | ORAL | Status: DC
  Administered 2020-10-25 – 2020-11-01 (×14): 400 mg via ORAL
  Filled 2020-10-25 (×16): qty 2

## 2020-10-25 MED ORDER — CHLORHEXIDINE GLUCONATE 0.12 % MT SOLN
15.0000 mL | Freq: Two times a day (BID) | OROMUCOSAL | Status: DC
  Administered 2020-10-25 – 2020-11-01 (×14): 15 mL via OROMUCOSAL
  Filled 2020-10-25 (×13): qty 15

## 2020-10-25 MED ORDER — LAMOTRIGINE 100 MG PO TABS
400.0000 mg | ORAL_TABLET | Freq: Two times a day (BID) | ORAL | Status: DC
  Administered 2020-10-25 – 2020-11-01 (×15): 400 mg via ORAL
  Filled 2020-10-25 (×14): qty 4

## 2020-10-25 MED ORDER — IPRATROPIUM-ALBUTEROL 0.5-2.5 (3) MG/3ML IN SOLN: 3.0000 mL | Freq: Four times a day (QID) | RESPIRATORY_TRACT | Status: DC | PRN

## 2020-10-25 MED ORDER — LEVETIRACETAM IN NACL 1000 MG/100ML IV SOLN
1000.0000 mg | INTRAVENOUS | Status: DC
  Administered 2020-10-25 – 2020-10-29 (×5): 1000 mg via INTRAVENOUS
  Filled 2020-10-25 (×6): qty 100

## 2020-10-25 MED ORDER — LEVOTHYROXINE SODIUM 112 MCG PO TABS
112.0000 ug | ORAL_TABLET | Freq: Every day | ORAL | Status: DC
  Administered 2020-10-25: 112 ug via ORAL
  Filled 2020-10-25 (×2): qty 1

## 2020-10-25 MED ORDER — CLOPIDOGREL BISULFATE 75 MG PO TABS
75.0000 mg | ORAL_TABLET | Freq: Every day | ORAL | Status: DC
  Administered 2020-10-25 – 2020-11-01 (×7): 75 mg via ORAL
  Filled 2020-10-25 (×7): qty 1

## 2020-10-25 MED ORDER — MONTELUKAST SODIUM 10 MG PO TABS
10.0000 mg | ORAL_TABLET | Freq: Every day | ORAL | Status: DC
  Administered 2020-10-25 – 2020-11-01 (×8): 10 mg via ORAL
  Filled 2020-10-25 (×9): qty 1

## 2020-10-25 MED ORDER — SODIUM CHLORIDE 0.9 % IV SOLN
100.0000 mg | Freq: Every day | INTRAVENOUS | Status: AC
  Administered 2020-10-26 – 2020-10-29 (×4): 100 mg via INTRAVENOUS
  Filled 2020-10-25 (×4): qty 20

## 2020-10-25 MED ORDER — PIPERACILLIN-TAZOBACTAM 3.375 G IVPB
3.3750 g | Freq: Three times a day (TID) | INTRAVENOUS | Status: DC
  Administered 2020-10-25 – 2020-10-28 (×9): 3.375 g via INTRAVENOUS
  Filled 2020-10-25 (×9): qty 50

## 2020-10-25 MED ORDER — DIGOXIN 125 MCG PO TABS
125.0000 ug | ORAL_TABLET | Freq: Every day | ORAL | Status: DC
  Administered 2020-10-25: 125 ug via ORAL
  Filled 2020-10-25: qty 1

## 2020-10-25 MED ORDER — CHLORHEXIDINE GLUCONATE CLOTH 2 % EX PADS
6.0000 | MEDICATED_PAD | Freq: Every day | CUTANEOUS | Status: DC
  Administered 2020-10-25 – 2020-11-01 (×8): 6 via TOPICAL

## 2020-10-25 MED ORDER — SODIUM CHLORIDE 0.9% FLUSH
3.0000 mL | Freq: Two times a day (BID) | INTRAVENOUS | Status: DC
  Administered 2020-10-25 – 2020-11-01 (×16): 3 mL via INTRAVENOUS

## 2020-10-25 MED ORDER — DEXAMETHASONE SODIUM PHOSPHATE 10 MG/ML IJ SOLN
6.0000 mg | Freq: Every day | INTRAMUSCULAR | Status: DC
  Administered 2020-10-25 – 2020-10-26 (×2): 6 mg via INTRAVENOUS
  Filled 2020-10-25 (×2): qty 1

## 2020-10-25 MED ORDER — GABAPENTIN 300 MG PO CAPS: 300.0000 mg | ORAL_CAPSULE | Freq: Every day | ORAL | Status: DC

## 2020-10-25 MED ORDER — LORATADINE 10 MG PO TABS
10.0000 mg | ORAL_TABLET | Freq: Every day | ORAL | Status: DC
  Administered 2020-10-25 – 2020-11-01 (×7): 10 mg via ORAL
  Filled 2020-10-25 (×7): qty 1

## 2020-10-25 MED ORDER — ALBUTEROL SULFATE HFA 108 (90 BASE) MCG/ACT IN AERS: 2.0000 | INHALATION_SPRAY | Freq: Four times a day (QID) | RESPIRATORY_TRACT | Status: DC | PRN

## 2020-10-25 MED ORDER — LEVETIRACETAM 500 MG PO TABS
1000.0000 mg | ORAL_TABLET | Freq: Two times a day (BID) | ORAL | Status: DC
  Administered 2020-10-25 (×2): 1000 mg via ORAL
  Filled 2020-10-25 (×2): qty 2

## 2020-10-25 MED ORDER — HYDROCOD POLST-CPM POLST ER 10-8 MG/5ML PO SUER
5.0000 mL | Freq: Two times a day (BID) | ORAL | Status: DC | PRN
  Filled 2020-10-25: qty 5

## 2020-10-25 MED ORDER — GUAIFENESIN-DM 100-10 MG/5ML PO SYRP
10.0000 mL | ORAL_SOLUTION | ORAL | Status: DC | PRN
  Administered 2020-10-29 (×2): 10 mL via ORAL
  Filled 2020-10-25 (×2): qty 10

## 2020-10-25 MED ORDER — ORAL CARE MOUTH RINSE
15.0000 mL | Freq: Two times a day (BID) | OROMUCOSAL | Status: DC
  Administered 2020-10-26 – 2020-11-01 (×14): 15 mL via OROMUCOSAL

## 2020-10-25 MED ORDER — SODIUM CHLORIDE 0.9 % IV SOLN
1250.0000 mg | INTRAVENOUS | Status: DC
  Administered 2020-10-26 – 2020-10-30 (×5): 1250 mg via INTRAVENOUS
  Filled 2020-10-25 (×5): qty 12.5

## 2020-10-25 MED ORDER — TAMSULOSIN HCL 0.4 MG PO CAPS
0.8000 mg | ORAL_CAPSULE | Freq: Every day | ORAL | Status: DC
  Administered 2020-10-26 – 2020-11-01 (×7): 0.8 mg via ORAL
  Filled 2020-10-25 (×8): qty 2

## 2020-10-25 MED ORDER — LACTATED RINGERS IV SOLN: INTRAVENOUS | Status: DC

## 2020-10-25 MED ORDER — DIGOXIN 0.25 MG/ML IJ SOLN
0.1250 mg | Freq: Every day | INTRAMUSCULAR | Status: DC
  Administered 2020-10-26 – 2020-11-01 (×7): 0.125 mg via INTRAVENOUS
  Filled 2020-10-25 (×2): qty 2
  Filled 2020-10-25 (×2): qty 0.5
  Filled 2020-10-25: qty 2
  Filled 2020-10-25 (×2): qty 0.5

## 2020-10-25 MED ORDER — LEVOTHYROXINE SODIUM 100 MCG/5ML IV SOLN
56.0000 ug | Freq: Every day | INTRAVENOUS | Status: DC
  Administered 2020-10-26 – 2020-10-30 (×5): 56 ug via INTRAVENOUS
  Filled 2020-10-25 (×5): qty 5

## 2020-10-25 MED ORDER — AMIODARONE HCL 200 MG PO TABS
200.0000 mg | ORAL_TABLET | Freq: Every day | ORAL | Status: DC
  Administered 2020-10-25 – 2020-11-01 (×7): 200 mg via ORAL
  Filled 2020-10-25 (×7): qty 1

## 2020-10-25 MED ORDER — FINASTERIDE 5 MG PO TABS
5.0000 mg | ORAL_TABLET | Freq: Every day | ORAL | Status: DC
Start: 2020-10-25 — End: 2020-11-02
  Administered 2020-10-25 – 2020-11-01 (×7): 5 mg via ORAL
  Filled 2020-10-25 (×7): qty 1

## 2020-10-25 MED ORDER — LEVETIRACETAM 250 MG PO TABS
250.0000 mg | ORAL_TABLET | Freq: Every day | ORAL | Status: DC
  Administered 2020-10-25: 250 mg via ORAL
  Filled 2020-10-25: qty 1

## 2020-10-25 MED ORDER — SODIUM CHLORIDE 0.9 % IV SOLN
200.0000 mg | Freq: Once | INTRAVENOUS | Status: AC
  Administered 2020-10-25: 200 mg via INTRAVENOUS
  Filled 2020-10-25: qty 200

## 2020-10-25 MED ORDER — ASPIRIN EC 81 MG PO TBEC
81.0000 mg | DELAYED_RELEASE_TABLET | Freq: Every day | ORAL | Status: DC
  Administered 2020-10-25 – 2020-11-01 (×7): 81 mg via ORAL
  Filled 2020-10-25 (×7): qty 1

## 2020-10-25 MED ORDER — BETHANECHOL CHLORIDE 10 MG PO TABS
10.0000 mg | ORAL_TABLET | Freq: Three times a day (TID) | ORAL | Status: DC
  Administered 2020-10-25 – 2020-11-01 (×20): 10 mg via ORAL
  Filled 2020-10-25 (×25): qty 1

## 2020-10-25 MED ORDER — VANCOMYCIN HCL 1750 MG/350ML IV SOLN: 1750.0000 mg | INTRAVENOUS | Status: DC

## 2020-10-25 MED ORDER — GABAPENTIN 300 MG PO CAPS
300.0000 mg | ORAL_CAPSULE | Freq: Every day | ORAL | Status: DC
  Administered 2020-10-25 – 2020-11-01 (×8): 300 mg via ORAL
  Filled 2020-10-25 (×10): qty 1

## 2020-10-25 NOTE — ED Notes (Signed)
199 ml detected on bladder scan

## 2020-10-25 NOTE — ED Notes (Signed)
Cayla,RN [phone], [facility] is where he lives and his nurse would like an update.

## 2020-10-25 NOTE — ED Notes (Signed)
Patient transported to Main CT. 

## 2020-10-25 NOTE — Progress Notes (Signed)
Bilateral lower extremity venous study completed.      Please see CV Proc for preliminary results.   Torence Palmeri, RVT  

## 2020-10-25 NOTE — Progress Notes (Signed)
Pharmacy Antibiotic Note  Austin Hartman is a 70 y.o. male admitted on 10/24/2020 with sepsis.  PMH significant for TBI, chronic subdural hematoma, CVA, epilepsy, recent Covid + diagnosis.  In the ED, patient received Vancomycin 2gm and Zosyn 3.375gm IV x 1 dose each.  Pharmacy has been consulted for Vancomycin and Zosyn dosing.  Last weight in Epic = 136.9 kg (08/15/20)  Plan:  Vancomycin 1750mg  IV q48h  Zosyn 3.375gm IV q8h (dose infused over 4 hours)  Daily SCr while on both Vancomycin and Zosyn  F/U current weight and adjust Vancomycin dose if needed  F/U culture results and sensitivities     Temp (24hrs), Avg:93.8 F (34.3 C), Min:93.8 F (34.3 C), Max:93.8 F (34.3 C)  Recent Labs  Lab 10/24/20 1652  WBC 14.5*  CREATININE 2.55*  LATICACIDVEN 4.5*  4.8*    CrCl cannot be calculated (Unknown ideal weight.).    Allergies  Allergen Reactions  . Acetaminophen Other (See Comments)    unknown  . Azithromycin Other (See Comments)    unknown  . Biaxin [Clarithromycin] Other (See Comments)    unknown  . Diltiazem Other (See Comments)    unknown  . Other Other (See Comments)    Darvocet-N - unknown  . Verapamil Other (See Comments)    unknown    Antimicrobials this admission: 1/3 Vanc >>   1/3 Zosyn >>    Dose adjustments this admission:    Microbiology results: 1/3 BCx:   1/3 Covid: positive  Thank you for allowing pharmacy to be a part of this patient's care.  12/22/20, PharmD 10/25/2020 12:56 AM

## 2020-10-25 NOTE — ED Notes (Signed)
Tim Lair, [phone], would like an update from the doctor about her brother.

## 2020-10-25 NOTE — Progress Notes (Deleted)
Pharmacy Antibiotic Note  Austin Hartman is a 70 y.o. male admitted on 10/24/2020 with sepsis.  PMH significant for TBI, chronic subdural hematoma, CVA, epilepsy, recent Covid + diagnosis.  In the ED, patient received Vancomycin 2gm and Zosyn 3.375gm IV x 1 dose each.  Pharmacy has been consulted for Vancomycin and Zosyn dosing.  Plan:  Vancomycin 1750mg  IV q48h  Zosyn 3.375gm IV q8h (dose infused over 4 hours)  Daily SCr while on both Vancomycin and Zosyn  F/U current weight and adjust Vancomycin dose if needed  F/U culture results and sensitivities     Temp (24hrs), Avg:93.8 F (34.3 C), Min:93.8 F (34.3 C), Max:93.8 F (34.3 C)  Recent Labs  Lab 10/24/20 1652  WBC 14.5*  CREATININE 2.55*  LATICACIDVEN 4.5*  4.8*    CrCl cannot be calculated (Unknown ideal weight.).    Allergies  Allergen Reactions  . Acetaminophen Other (See Comments)    unknown  . Azithromycin Other (See Comments)    unknown  . Biaxin [Clarithromycin] Other (See Comments)    unknown  . Diltiazem Other (See Comments)    unknown  . Other Other (See Comments)    Darvocet-N - unknown  . Verapamil Other (See Comments)    unknown    Antimicrobials this admission: 1/3 Vanc >>   1/3 Zosyn >>    Dose adjustments this admission:    Microbiology results: 1/3 BCx:   1/3 Covid: positive  Thank you for allowing pharmacy to be a part of this patient's care.  12/22/20, PharmD 10/25/2020 12:46 AM

## 2020-10-25 NOTE — Progress Notes (Signed)
PROGRESS NOTE  Austin Hartman  DOB: [birth year]  PCP: Bonnita Nasuti, MD TDD:[id number]  DOA: 10/24/2020  LOS: 1 day   Chief Complaint  Patient presents with  . Covid Positive    10/20/20   Brief narrative: Austin Hartman is a 70 y.o. male with PMH significant for TBI, CVA, chronic SDH, s/p craniectomy, epilepsy, and recent covid-19 diagnosis who lives in a long-term care facility.  At baseline, patient is able to transfer himself from bed to wheelchair and rolled around his wheelchair.  At baseline he is able to have a meaningful conversation. Patient was sent to the ED from [facility] on 1/3 for altered mental status, hypothermia, cough, hypoxia to 70s requiring 10 L of oxygen.  In the ED, patient was hypothermic to 94, hypotensive down to 70s and was altered. Labs showed WBC count elevated to 14.5, creatinine elevated to 2.55, lactic acid elevated to 4.8 He had copious diarrhea with negative C. Difficile. EDP discussed poor prognosis with patient's sister who is HCPOA. Visitation was offered, confirmed DNR and desire to avoid invasive procedures including central lines and ventilator. IV fluids, antibiotics were given with slight improvement in blood pressure.  Pt was admitted under hospitalist service Currently care was consulted. Of note patient tested positive for Covid on 12/30 despite history of vaccination x3 latest in December  Subjective: Patient was seen and examined this morning.  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  Is lethargic.  Mumbles few words on command but unable to follow motor commands. Per RN, patient continues to have profuse watery diarrhea. Chart reviewed Heart rate in 80s, blood pressure dropped to 90s now Labs with sodium up at 147, creatinine remains elevated, WBC count remains elevated at 14.7  Assessment/Plan: Severe sepsis with hypotension -Presented with hypothermia, hypotension, altered mental  status,  -Labs showed leukocytosis, lactic acidosis and elevated procalcitonin -Blood culture sent.  Send urinalysis and culture.  C diff negative.  GI pathogen panel sent -Currently on IV vancomycin and Zosyn.  Because of AKI, I would stop vancomycin and continue Zosyn only. -Continues to have elevated lactic acid level.  Increase IV fluid to LR at 125 mL/h Recent Labs  Lab 10/24/20 1652 10/25/20 0525 10/25/20 1416  WBC 14.5* 14.7*  --   LATICACIDVEN 4.5*  4.8*  --  4.2*  PROCALCITON 0.35  --  15.34   Abdominal distention/ Watery diarrhea -C. difficile negative. -pending GI pathogen panel -Unable to elicit abdominal tenderness because of altered mental status.  If tenderness persists or worsens, consider getting a CT scan of abdomen.  AKI -Baseline creatinine available in the system is from 2017 and it was normal at 0.84. -Presented with an elevated creatinine of 2.55.  Secondary to sepsis.  Expect improvement with IV fluids. -Med adjustment Recent Labs    10/24/20 1652 10/25/20 0525  BUN 54* 60*  CREATININE 2.55* 2.57*   Breakthrough covid-19 infection Acute respiratory failure with hypoxia  -Sent to the ED from the facility for low oxygen saturation.  -No evidence of pneumonia on chest x-ray but currently on 10 L oxygen -history of vaccination x3, latest in December. Positive test 12/30.   -Treatment: I will start him on a course of IV remdesivir for 5 days as well as IV dexamethasone. Wean down oxygen as tolerated. -Supportive care: Vitamin C, Zinc, PRN inhalers, Tylenol, Antitussives (benzonatate/ Mucinex/Tussionex).   -Encouraged incentive spirometry, prone position, out of bed and early mobilization as much as  possible -Continue airborne/contact isolation precautions for duration of 3 weeks from the day of diagnosis. -WBC and inflammatory markers trend as below.  Recent Labs  Lab 10/24/20 1652 10/25/20 0525 10/25/20 1416  SARSCOV2NAA POSITIVE*  --   --   WBC  14.5* 14.7*  --   LATICACIDVEN 4.5*  4.8*  --  4.2*  PROCALCITON 0.35  --  15.34  DDIMER 3.46*  --   --   FERRITIN 32  --   --   LDH 187  --   --   CRP 3.2*  --   --   ALT 17 19  --    The treatment plan and use of medications and known side effects were discussed with patient/family. Some of the medications used are based on case reports/anecdotal data.  All other medications being used in the management of COVID-19 based on limited study data.  Complete risks and long-term side effects are unknown, however in the best clinical judgment they seem to be of some benefit.  Patient wanted to proceed with treatment options provided.  Acute metabolic encephalopathy -Likely related to sepsis and background of history of CVA -Per patient's sister, at baseline, patient is able to have a meaningful conversation. At the time of my evaluation, patient is lethargic and only mumbles.  Is also on multiple mood altering medications because of prior history of TBI and seizures.  Continue to monitor mental status.  Chronic diastolic CHF Essential hypertension -Home meds include metoprolol 12.5 mg 3 times daily and torsemide 40 mg twice daily. -Currently on hold because of hypotension.  Chronic A. fib/a flutter -On digoxin 125 mcg daily.  Not on anticoagulation because of history of SDH  History of CVA, CAD -On aspirin, Plavix and statin.  Elevated D-dimer and asymmetric edema -Obtain venous doppler U/S r/o DVT w/asymmetric edema   History of TBI, chronic subdural hematoma, craniectomy -Will avoid anticoagulants in this setting despite high risk of VTE.   Seizure disorder -Continue AEDs including gabapentin 300mg  daily, keppra 1250mg  qAM, 1000mg  qHS, clobazam 40mg  daily, lamictal 400mg  BID  BPH -Continue flomax, finasteride, bethanechol  Depression - SSRI  Hypothyroidism  - Continue synthroid  Goals of care discussion: At this point the patient, who is chronically ill at long  term care facility for 12 years with poor functional status (i.e. limited physiologic reserve), now has acute covid-19 infection, severe sepsis, and multiorgan failure (ARF, encephalopathy, hypoxia). Prognosis is exceedingly grim. We have confirmed HCPOA's understanding of this and wishes to avoid invasive procedures.   Mobility: Poor functional status at baseline Code Status:   Code Status: DNR  Nutritional status: There is no height or weight on file to calculate BMI.     Diet Order            Diet NPO time specified  Diet effective now                 DVT prophylaxis: SCDs Start: 10/25/20 0325   Antimicrobials:  IV Zosyn Fluid: LR@125  above per hour Consultants: None Family Communication: : Updated patient's sister this afternoon.  Status is: Inpatient  Remains inpatient appropriate because:Hemodynamically unstable  Dispo: The patient is from: SNF              Anticipated d/c is to: SNF              Anticipated d/c date is: > 3 days              Patient  currently is not medically stable to d/c.   Infusions:  . lactated ringers 100 mL/hr at 10/25/20 0139  . piperacillin-tazobactam (ZOSYN)  IV 3.375 g (10/25/20 1108)    Scheduled Meds: . amiodarone  200 mg Oral Daily  . aspirin EC  81 mg Oral Daily  . bethanechol  10 mg Oral TID  . cloBAZam  40 mg Oral Daily  . clopidogrel  75 mg Oral Daily  . digoxin  125 mcg Oral Daily  . finasteride  5 mg Oral Daily  . gabapentin  300 mg Oral QHS  . guaiFENesin  400 mg Oral BID  . lamoTRIgine  400 mg Oral BID  . levETIRAcetam  1,000 mg Oral BID  . levETIRAcetam  250 mg Oral Daily  . levothyroxine  112 mcg Oral Q0600  . loratadine  10 mg Oral Daily  . montelukast  10 mg Oral QHS  . sodium chloride flush  3 mL Intravenous Q12H  . tamsulosin  0.8 mg Oral QPC supper    Antimicrobials: Anti-infectives (From admission, onward)   Start     Dose/Rate Route Frequency Ordered Stop   10/26/20 1800  vancomycin (VANCOREADY) IVPB  1750 mg/350 mL  Status:  Discontinued        1,750 mg 175 mL/hr over 120 Minutes Intravenous Every 48 hours 10/25/20 0045 10/25/20 1455   10/25/20 0200  piperacillin-tazobactam (ZOSYN) IVPB 3.375 g        3.375 g 12.5 mL/hr over 240 Minutes Intravenous Every 8 hours 10/25/20 0045     10/24/20 1745  vancomycin (VANCOCIN) IVPB 1000 mg/200 mL premix  Status:  Discontinued        1,000 mg 200 mL/hr over 60 Minutes Intravenous  Once 10/24/20 1738 10/24/20 1743   10/24/20 1745  piperacillin-tazobactam (ZOSYN) IVPB 3.375 g        3.375 g 12.5 mL/hr over 240 Minutes Intravenous Once 10/24/20 1738 10/24/20 1855   10/24/20 1745  vancomycin (VANCOREADY) IVPB 2000 mg/400 mL        2,000 mg 200 mL/hr over 120 Minutes Intravenous  Once 10/24/20 1743 10/24/20 1951      PRN meds:    Objective: Vitals:   10/25/20 1430 10/25/20 1600  BP: 104/70 97/66  Pulse: 89 88  Resp: 17 17  Temp:    SpO2: 97% 100%    Intake/Output Summary (Last 24 hours) at 10/25/2020 1628 Last data filed at 10/25/2020 0102 Gross per 24 hour  Intake 2003 ml  Output -  Net 2003 ml   There were no vitals filed for this visit. Weight change:  There is no height or weight on file to calculate BMI.   Physical Exam: General exam: Elderly Caucasian male.  Lethargic. Skin: No rashes, lesions or ulcers. HEENT: Deformed head because of history of craniotomy. Lungs: Diminished air entry bilaterally CVS: Regular rate and rhythm, no murmur GI/Abd soft, grimaces on deep palpation, bowel sound present CNS: Lethargic Psychiatry: Lethargic Extremities: Both legs swollen because of bedbound status  Data Review: I have personally reviewed the laboratory data and studies available.  Recent Labs  Lab 10/24/20 1652 10/25/20 0525  WBC 14.5* 14.7*  NEUTROABS 12.8*  --   HGB 13.1 13.5  HCT 42.8 45.4  MCV 96.0 98.7  PLT 210 201   Recent Labs  Lab 10/24/20 1652 10/25/20 0525  NA 144 147*  K 4.3 4.8  CL 104 106  CO2 24 23   GLUCOSE 139* 107*  BUN 54* 60*  CREATININE 2.55*  2.57*  CALCIUM 8.7* 8.0*    F/u labs ordered  Signed, Lorin Glass, MD [facility] 10/25/2020

## 2020-10-26 ENCOUNTER — Inpatient Hospital Stay (HOSPITAL_COMMUNITY): Payer: Medicare Other

## 2020-10-26 ENCOUNTER — Other Ambulatory Visit: Payer: Self-pay

## 2020-10-26 DIAGNOSIS — J9601 Acute respiratory failure with hypoxia: Secondary | ICD-10-CM | POA: Diagnosis not present

## 2020-10-26 DIAGNOSIS — U071 COVID-19: Secondary | ICD-10-CM | POA: Diagnosis not present

## 2020-10-26 DIAGNOSIS — R140 Abdominal distension (gaseous): Secondary | ICD-10-CM

## 2020-10-26 DIAGNOSIS — A419 Sepsis, unspecified organism: Secondary | ICD-10-CM | POA: Diagnosis not present

## 2020-10-26 DIAGNOSIS — R4182 Altered mental status, unspecified: Secondary | ICD-10-CM

## 2020-10-26 DIAGNOSIS — N179 Acute kidney failure, unspecified: Secondary | ICD-10-CM

## 2020-10-26 DIAGNOSIS — E870 Hyperosmolality and hypernatremia: Secondary | ICD-10-CM

## 2020-10-26 DIAGNOSIS — L8990 Pressure ulcer of unspecified site, unspecified stage: Secondary | ICD-10-CM | POA: Insufficient documentation

## 2020-10-26 LAB — BASIC METABOLIC PANEL
Anion gap: 15 (ref 5–15)
BUN: 73 mg/dL — ABNORMAL HIGH (ref 8–23)
CO2: 26 mmol/L (ref 22–32)
Calcium: 7.9 mg/dL — ABNORMAL LOW (ref 8.9–10.3)
Chloride: 111 mmol/L (ref 98–111)
Creatinine, Ser: 3.55 mg/dL — ABNORMAL HIGH (ref 0.61–1.24)
GFR, Estimated: 18 mL/min — ABNORMAL LOW (ref >60)
Glucose, Bld: 128 mg/dL — ABNORMAL HIGH (ref 70–99)
Potassium: 4.6 mmol/L (ref 3.5–5.1)
Sodium: 152 mmol/L — ABNORMAL HIGH (ref 135–145)

## 2020-10-26 LAB — CBC WITH DIFFERENTIAL/PLATELET
Abs Immature Granulocytes: 0.3 10*3/uL — ABNORMAL HIGH (ref 0.00–0.07)
Band Neutrophils: 38 %
Basophils Absolute: 0 10*3/uL (ref 0.0–0.1)
Basophils Relative: 0 %
Eosinophils Absolute: 0 10*3/uL (ref 0.0–0.5)
Eosinophils Relative: 0 %
HCT: 39.6 % (ref 39.0–52.0)
Hemoglobin: 12.3 g/dL — ABNORMAL LOW (ref 13.0–17.0)
Lymphocytes Relative: 4 %
Lymphs Abs: 0.6 10*3/uL — ABNORMAL LOW (ref 0.7–4.0)
MCH: 29.6 pg (ref 26.0–34.0)
MCHC: 31.1 g/dL (ref 30.0–36.0)
MCV: 95.2 fL (ref 80.0–100.0)
Monocytes Absolute: 0.5 10*3/uL (ref 0.1–1.0)
Monocytes Relative: 3 %
Myelocytes: 2 %
Neutro Abs: 14.7 10*3/uL — ABNORMAL HIGH (ref 1.7–7.7)
Neutrophils Relative %: 53 %
Platelets: 180 10*3/uL (ref 150–400)
RBC: 4.16 MIL/uL — ABNORMAL LOW (ref 4.22–5.81)
RDW: 15.3 % (ref 11.5–15.5)
WBC Morphology: INCREASED
WBC: 16.2 10*3/uL — ABNORMAL HIGH (ref 4.0–10.5)
nRBC: 0 % (ref 0.0–0.2)

## 2020-10-26 LAB — URINALYSIS, ROUTINE W REFLEX MICROSCOPIC
Bilirubin Urine: NEGATIVE
Glucose, UA: NEGATIVE mg/dL
Ketones, ur: NEGATIVE mg/dL
Leukocytes,Ua: NEGATIVE
Nitrite: NEGATIVE
Protein, ur: 30 mg/dL — AB
Specific Gravity, Urine: 1.019 (ref 1.005–1.030)
pH: 5 (ref 5.0–8.0)

## 2020-10-26 LAB — GASTROINTESTINAL PANEL BY PCR, STOOL (REPLACES STOOL CULTURE)

## 2020-10-26 LAB — PROCALCITONIN: Procalcitonin: 10.43 ng/mL

## 2020-10-26 LAB — C-REACTIVE PROTEIN: CRP: 31 mg/dL — ABNORMAL HIGH (ref <1.0)

## 2020-10-26 LAB — D-DIMER, QUANTITATIVE: D-Dimer, Quant: 7.46 ug/mL-FEU — ABNORMAL HIGH (ref 0.00–0.50)

## 2020-10-26 LAB — MAGNESIUM: Magnesium: 3 mg/dL — ABNORMAL HIGH (ref 1.7–2.4)

## 2020-10-26 LAB — CREATININE, URINE, RANDOM: Creatinine, Urine: 68.21 mg/dL

## 2020-10-26 LAB — PHOSPHORUS: Phosphorus: 5.3 mg/dL — ABNORMAL HIGH (ref 2.5–4.6)

## 2020-10-26 LAB — FERRITIN: Ferritin: 72 ng/mL (ref 24–336)

## 2020-10-26 LAB — SODIUM, URINE, RANDOM: Sodium, Ur: 16 mmol/L

## 2020-10-26 LAB — LACTIC ACID, PLASMA: Lactic Acid, Venous: 2.3 mmol/L (ref 0.5–1.9)

## 2020-10-26 LAB — TSH: TSH: 0.275 u[IU]/mL — ABNORMAL LOW (ref 0.350–4.500)

## 2020-10-26 MED ORDER — PROSOURCE PLUS PO LIQD
30.0000 mL | Freq: Two times a day (BID) | ORAL | Status: DC
  Administered 2020-10-26 – 2020-11-01 (×12): 30 mL via ORAL
  Filled 2020-10-26 (×12): qty 30

## 2020-10-26 MED ORDER — METHYLPREDNISOLONE SODIUM SUCC 125 MG IJ SOLR
81.2500 mg | Freq: Two times a day (BID) | INTRAMUSCULAR | Status: DC
  Administered 2020-10-26 – 2020-11-01 (×14): 81.25 mg via INTRAVENOUS
  Filled 2020-10-26 (×14): qty 2

## 2020-10-26 MED ORDER — ADULT MULTIVITAMIN W/MINERALS CH
1.0000 | ORAL_TABLET | Freq: Every day | ORAL | Status: DC
  Administered 2020-10-26 – 2020-11-01 (×7): 1 via ORAL
  Filled 2020-10-26 (×7): qty 1

## 2020-10-26 MED ORDER — DEXTROSE 5 % IV SOLN: INTRAVENOUS | Status: DC

## 2020-10-26 MED ORDER — SODIUM CHLORIDE 0.45 % IV BOLUS
1000.0000 mL | Freq: Once | INTRAVENOUS | Status: AC
  Administered 2020-10-26: 1000 mL via INTRAVENOUS

## 2020-10-26 MED ORDER — BOOST / RESOURCE BREEZE PO LIQD CUSTOM
1.0000 | Freq: Two times a day (BID) | ORAL | Status: DC
  Administered 2020-10-26 – 2020-11-01 (×12): 1 via ORAL

## 2020-10-26 MED ORDER — IPRATROPIUM-ALBUTEROL 20-100 MCG/ACT IN AERS
2.0000 | INHALATION_SPRAY | Freq: Four times a day (QID) | RESPIRATORY_TRACT | Status: DC
  Administered 2020-10-26 – 2020-10-27 (×6): 2 via RESPIRATORY_TRACT
  Filled 2020-10-26: qty 4

## 2020-10-26 NOTE — Progress Notes (Signed)
Initial Nutrition Assessment  DOCUMENTATION CODES:   Obesity unspecified  INTERVENTION:  - will order Boost Breeze BID, each supplement provides 250 kcal and 9 grams of protein. - will order 30 ml Prosource Plus BID, each supplement provides 100 kcal and 15 grams protein.  - will order 1 tablet multivitamin with minerals/day. - will monitor for SLP recommendations.   NUTRITION DIAGNOSIS:   Increased nutrient needs related to acute illness,catabolic illness (COVID-19 infection) as evidenced by estimated needs  GOAL:   Patient will meet greater than or equal to 90% of their needs  MONITOR:   PO intake,Supplement acceptance,Diet advancement,Labs,Weight trends,Skin  REASON FOR ASSESSMENT:   Malnutrition Screening Tool  ASSESSMENT:   70 y.o. male with medical history of TBI, CVA, chronic SDH, s/p craniectomy, and epilepsy. He was recently dx with VOVID-19. He presented to the ED from [facility] on 1/3 d/t AMS, cough, hypoxia to 70s, and hypothermia.  Diet advanced from NPO to CLD today at 1123. No documented intakes. Patient is noted to be a/o to self only.   Able to talk with RN earlier this afternoon. She reported patient was sleeping and that he had not had anything PO since diet advancement. Reported patient is pending SLP evaluation.   He has not been seen by a [place] RD at any time in the past.   Weight today is 296 lb. PTA, the most recently documented weight was on 08/15/20 when he weighed 301 lb. This indicates 5 lb weight loss (1.7% body weight) in 2 months.   Mild pitting edema to BUE documented in the edema section of flow sheet.   Per notes: - severe sepsis  - abdominal distention and watery diarrhea--c.diff negative, pending GI panel - AKI - COVID-19 (positive 12/30) leading to acute respiratory failure with hypoxia   Labs reviewed; Na: 152 mmol/l, BUN: 73 mg/dl, creatinine: 9.50 mg/dl, Ca: 7.9 mg/dl, Phos: 5.3 mg/dl, GFR: 18 ml/min.  Medications reviewed; 56  mcg IV synthroid/day, 81.25 mg solu-medrol BID, 200 mg IV remdesivir x1 dose 1/4, 100 mg IV remdesivir x1 dose/day x4 days (1/5-1/8). IVF; D5 @ 125 ml/hr (510 kcal).     NUTRITION - FOCUSED PHYSICAL EXAM:  unable to complete at this time.   Diet Order:   Diet Order            Diet clear liquid Room service appropriate? Yes; Fluid consistency: Thin  Diet effective now                 EDUCATION NEEDS:   Not appropriate for education at this time  Skin:  Skin Assessment: Skin Integrity Issues: Skin Integrity Issues:: Stage II Stage II: coccyx  Last BM:  1/7 at 0500 (type 7)  Height:   Ht Readings from Last 1 Encounters:  08/15/20 6\' 4"  (1.93 m)    Weight:   Wt Readings from Last 1 Encounters:  10/26/20 134.3 kg    Estimated Nutritional Needs:  Kcal:  2330-2550 kcal Protein:  145-160 grams Fluid:  >/= 3.5 L/day      12/24/20, MS, RD, LDN, CNSC Inpatient Clinical Dietitian RD pager # available in [facility]  After hours/weekend pager # available in [facility]

## 2020-10-26 NOTE — Progress Notes (Signed)
Patient unable to void bladder scan showed 586 ml. Foley cath placed per order.

## 2020-10-26 NOTE — Evaluation (Signed)
Clinical/Bedside Swallow Evaluation Patient Details  Name: Austin Hartman MRN: [id number] Date of Birth: 1951/04/17  Today's Date: 10/26/2020 Time: SLP Start Time (ACUTE ONLY): 1600 SLP Stop Time (ACUTE ONLY): 1615 SLP Time Calculation (min) (ACUTE ONLY): 15 min  Past Medical History:  Past Medical History:  Diagnosis Date  . Abdominal pain, generalized 03/29/2016  . Altered mental state 03/29/2016  . At high risk for aspiration 03/29/2016  . CHF (congestive heart failure) (HCC)   . Chronic constipation 03/29/2016  . Coronary artery disease   . Diarrhea   . DNR (do not resuscitate) 03/29/2016  . Dysphagia   . Epilepsy (HCC) 03/29/2016  . Essential hypertension   . Hemiplegia (HCC) 12/13/2011  . History of traumatic brain injury   . Hyperlipidemia 06/30/2015  . Hypertension   . Hypotension, unspecified 03/29/2016  . Hypothermia 03/29/2016  . Left hemiparesis (HCC) 03/29/2016  . Leukocytosis 03/29/2016  . 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 Partial epilepsy with impairment of consciousness, intractable (HCC) 12/13/2011  . Sepsis (HCC)   . Stroke (HCC)   . TBI (traumatic brain injury) (HCC)   . Typical atrial flutter (HCC) 05/07/2016   Past Surgical History:  Past Surgical History:  Procedure Laterality Date  . BRAIN SURGERY    . CARDIAC SURGERY    . CHOLECYSTECTOMY    . CORONARY ANGIOPLASTY WITH STENT PLACEMENT    . HERNIA REPAIR     HPI:  70yo male admitted 10/24/20 with AMS, hypoxia (70%). PMH: TBI, CVA, chronic SDH, s/p craniectomy, epilepsy, CHF, dysphagia, recent covid-19 diagnosis, from long-term care facility.   Assessment / Plan / Recommendation Clinical Impression  Patient presents with a moderate oral and mild-moderate pharyngeal dysphagia. He exhibited oral delays in anterior to posterior movement of liquids and gelatin solids, 'munch' chewing pattern  with gelatin, decreased lingual ROM resulting in very poor ability to move ice chip in mouth. He exhibited one instance of delayed cough with cup sips of thin liquids but no other overt s/s aspiration or penetration were obseved. Patient's voice remained clear throughout PO intake and HR remained steady at 90, RR steady at 14-15 and SpO2 steady at 94-95%. SLP Visit Diagnosis: Dysphagia, unspecified (R13.10)    Aspiration Risk  Mild aspiration risk;Moderate aspiration risk    Diet Recommendation Thin liquid (clear liquids)   Liquid Administration via: Cup;Straw Medication Administration: Crushed with puree Supervision: Full supervision/cueing for compensatory strategies;Staff to assist with self feeding Compensations: Minimize environmental distractions;Slow rate;Small sips/bites Postural Changes: Seated upright at 90 degrees    Other  Recommendations Oral Care Recommendations: Oral care QID   Follow up Recommendations Skilled Nursing facility;24 hour supervision/assistance      Frequency and Duration min 1 x/week  1 week       Prognosis Prognosis for Safe Diet Advancement: Fair Barriers to Reach Goals: Time post onset;Severity of deficits      Swallow Study   General Date of Onset: 10/26/20 HPI: 70yo male admitted 10/24/20 with AMS, hypoxia (70%). PMH: TBI, CVA, chronic SDH, s/p craniectomy, epilepsy, CHF, dysphagia, recent covid-19 diagnosis, from long-term care facility. Type of Study: Bedside Swallow Evaluation Previous Swallow Assessment: BSE 03/30/16 = Dys2/thin, MBS early 2017 Diet Prior to this Study: Thin liquids Temperature Spikes Noted: Yes (99.0) Respiratory Status: Nasal cannula History of Recent Intubation: No Behavior/Cognition: Cooperative;Pleasant mood;Alert;Requires cueing Oral Cavity Assessment: Within Functional Limits Oral Care Completed by SLP: Yes Oral Cavity - Dentition:  Adequate natural dentition Vision: Impaired for self-feeding Self-Feeding Abilities:  Total assist Patient Positioning: Upright in bed Baseline Vocal Quality: Low vocal intensity Volitional Cough: Cognitively unable to elicit Volitional Swallow: Unable to elicit    Oral/Motor/Sensory Function Overall Oral Motor/Sensory Function: Moderate impairment Facial ROM: Reduced right Facial Symmetry: Abnormal symmetry right Facial Strength: Reduced right Lingual ROM: Reduced right;Reduced left Lingual Strength: Reduced   Ice Chips Ice chips: Impaired Oral Phase Impairments: Reduced lingual movement/coordination;Reduced labial seal Oral Phase Functional Implications: Prolonged oral transit   Thin Liquid Thin Liquid: Impaired Presentation: Straw;Cup Oral Phase Impairments: Reduced labial seal Pharyngeal  Phase Impairments: Cough - Delayed Other Comments: one delayed cough with cup sip of thin liquids. Patient was then able to suck with straw and did not exhibit any cough or throat clearing response with successive sips    Nectar Thick     Honey Thick     Puree Puree: Impaired Oral Phase Impairments: Reduced lingual movement/coordination Other Comments: Patient consumed gelatin and did exhibit a mild amount of 'munch' chewing prior to swallow initiation   Solid            Angela Nevin, MA, CCC-SLP 10/26/20 5:15 PM

## 2020-10-26 NOTE — Progress Notes (Signed)
PROGRESS NOTE    Austin Hartman  UDJ:[id number] DOB: Aug 09, 1951 DOA: 10/24/2020 PCP: Bonnita Nasuti, MD    Chief Complaint  Patient presents with  . Covid Positive    10/20/20    Brief Narrative:  Austin Hartman is a 70 y.o. male with PMH significant for TBI, CVA, chronic SDH, s/p craniectomy, epilepsy, and recent covid-19 diagnosis who lives in a long-term care facility.  At baseline, patient is able to transfer himself from bed to wheelchair and rolled around his wheelchair.  At baseline he is able to have a meaningful conversation. Patient was sent to the ED from [facility] on [facility] for altered mental status, hypothermia, cough, hypoxia to 70s requiring 10 L of oxygen.  In the ED, patient was hypothermic to 94, hypotensive down to 70s and was altered. Labs showed WBC count elevated to 14.5, creatinine elevated to 2.55, lactic acid elevated to 4.8 He had copious diarrhea with negative C. Difficile. EDP discussed poor prognosis with patient's sister who is HCPOA. Visitation was offered, confirmed DNR and desire to avoid invasive procedures including central lines and ventilator. IV fluids, antibiotics were given with slight improvement in blood pressure.  Pt was admitted under hospitalist service Currently care was consulted. Of note patient tested positive for Covid on 12/30 despite history of vaccination x3 latest in December   Assessment & Plan:   Principal Problem:   Severe sepsis [facility]) Active Problems:   Dysphagia   Coronary artery disease involving native coronary artery of native heart with angina pectoris (HCC)   Diarrhea   Hypertensive heart disease with heart failure (HCC)   Chronic constipation   Hypothermia   Epilepsy (HCC)   Hypotension, unspecified   At high risk for aspiration   DNR (do not resuscitate)   Leukocytosis   Hyperlipidemia   Chronic atrial fibrillation (HCC)   Subdural hematoma, chronic (HCC)   Pressure injury of skin  1 severe  sepsis with hypotension Patient met criteria for sepsis with an hypothermia, hypotension, altered mental status.  Lab work was consistent with a leukocytosis, lactic acidosis, elevated procalcitonin.  C. difficile PCR negative.  GI pathogen panel pending.  Urinalysis done today nitrite negative leukocytes negative.  Blood cultures pending with no growth to date.  Repeat chest x-ray with persistent bilateral atelectasis.  Atelectasis progress in the right lung base.  Persistent interstitial prominence left lung.  Small right pleural effusion cannot be excluded.  Stable cardiomegaly. Patient initially on empiric IV antibiotics of Vanco and Zosyn.  Due to worsening renal function vancomycin discontinued.  Patient currently on Zosyn.  Change IV fluids to D5W at 125 cc an hour due to worsening renal function and hypernatremia.  Supportive care.  Follow.  2.  Abdominal distention/watery diarrhea C. difficile PCR negative.  GI pathogen panel pending.  Diarrhea could likely be secondary to COVID-19.  Abdomen nontender to palpation.  Follow.  If patient with worsening abdominal pain may consider imaging of the abdomen.  Rectal tube placed.  3.  Acute renal failure Likely secondary to a prerenal azotemia as patient had presented with severe sepsis and hypotension in the setting of diuretics of Demadex.  Last creatinine noted on epic in 2017 was 0.84.  Creatinine currently at 3.55 today with a BUN of 73 and a sodium level of 152.  Patient has been n.p.o. since admission.  Patient also noted to have diarrhea per RN.  Check a urine sodium, urine creatinine, renal ultrasound.  Place Foley catheter.  Give  a liter bolus of half-normal saline.  Place on D5W at 125 cc an hour.  Strict I's and O's.  Daily weights.  Follow urine output.  Check a renal ultrasound.  If no improvement in renal function and pending palliative care conversation with family may need to get nephrology involved.  Monitor for  now.  4.Hypernatremia Likely secondary to volume depletion.  Discontinue LR.  Placed on D5W.  Follow.  5.  Breakthrough COVID-19 infection/acute respiratory failure with hypoxia Patient sent to the ED from facility due to hypoxia.  No evidence of pneumonia noted on chest x-ray.  Patient with increased O2 requirements currently on 10 L high flow nasal cannula with sats of 96%. D-dimer elevated and worsening currently at 7.46 from 3.46.  CRP currently at 31 from 3.2.  Start IV Solu-Medrol 80 mg IV every 12 hours.  Continue IV remdesivir.  Continue empiric IV antibiotics.  Supportive care.  Follow.  6.  Acute metabolic encephalopathy Likely secondary to sepsis in the setting of history of CVA. Per Dr.Dahal, patient's sister stated at baseline patient is able to open meaningful conversation.  At time of my evaluation patient sleeping but arousable.  Seems to be answering some questions appropriately.  Patient also noted to have a prior history of TBI and seizures and on multiple mood altering medications.  Hydrated IV fluids.  Continue IV antibiotics.  We will start IV steroids due to worsening CRP.  Follow.  7.  Chronic diastolic CHF/hypertension Currently stable.  Patient noted to be on metoprolol and torsemide twice daily which on hold due to hypotension.  Patient on IV fluids.  Follow.  8.  Chronic atrial flutter/atrial fibrillation Continue digoxin 125 MCG's daily.  Not on anticoagulation due to history of SDH.  Follow.  9.  History of CVA/coronary artery disease Continue aspirin and Plavix and statin.  10.  Elevated D-dimer with asymmetric edema Lower extremity Dopplers negative for DVT.  11.  History of TBI/chronic subdural hematoma/craniectomy We will try to avoid anticoagulants in this setting despite high risk for VTE.  Follow.  12.  Seizure disorder Continue gabapentin 300 mg daily, Keppra 1250 mg every morning, 1000 g nightly, clobazam 40 mg daily, Lamictal 400 mg twice  daily  13.  BPH/urinary retention Noted to have urinary retention per RN.  Patient in acute renal failure.  Place Foley catheter.  Continue home regimen Flomax, finasteride, bethanechol.  14.  Depression SSRI  15.  Hypothyroidism Continue Synthroid.  16.  Dysphagia SLP.  Place on clear liquids.  17.  Goals of care discussion Dr. Pietro Cassis discussed with patient's [facility] POA who understands patient with a grim prognosis.  It is noted that patient wishes to avoid invasive procedures including central lines. Patient chronically ill at a long-term care facility x12 years, poor functional status, now with acute COVID-19 infection, sepsis, multiorgan failure with acute renal failure, encephalopathy, hypoxia.  Palliative care consulted and goals of care discussion pending.   DVT prophylaxis: SCDs Code Status: DNR Family Communication: Updated patient.  No family at bedside. Disposition:   Status is: Inpatient    Dispo: The patient is from: Long-term care facility              Anticipated d/c is to: Long-term care facility              Anticipated d/c date is: To be determined              Patient currently with worsening inflammatory markers, worsening renal  function, diarrhea, urinary retention.  Not stable for discharge.       Consultants:   Palliative care pending  Procedures:   CT head 10/25/2020  Abdominal films 10/24/2020  Chest x-ray 10/24/2020, 10/26/2018  Renal ultrasound 10/26/2020  Lower extremity Dopplers 10/25/2020  Antimicrobials:   IV Zosyn 10/24/2020>>>>  IV remdesivir 10/25/2020>>>> 10/30/2020  IV vancomycin 10/24/2020>>>> 10/25/2020   Subjective: Sleeping but arousable. Some c/o SOB. No CP. No ABd pain. C/o LE pain. Answering some questions appropriately. Paer RN patient with urinary retention overnight, FC placed this morning with 650cc output. Patient with diarrhea per RN.  Objective: Vitals:   10/26/20 0500 10/26/20 0800 10/26/20 0900 10/26/20 1000  BP:   135/71  (!) 110/48  Pulse:   100 96  Resp:   19 (!) 27  Temp:  98.6 F (37 C)    TempSrc:  Axillary    SpO2:   96% 96%  Weight: 134.3 kg       Intake/Output Summary (Last 24 hours) at 10/26/2020 1117 Last data filed at 10/26/2020 0932 Gross per 24 hour  Intake 1778.15 ml  Output 1800 ml  Net -21.85 ml   Filed Weights   10/26/20 0500  Weight: 134.3 kg    Examination:  General exam: Appears calm and comfortable.  Deformed head secondary to history of craniotomy Respiratory system: Coarse rhonchorus BS in anterior lung fields.  No wheezing noted. Cardiovascular system: S1 & S2 heard, RRR. No JVD, murmurs, rubs, gallops or clicks. No pedal edema. Gastrointestinal system: Abdomen is nondistended, soft and nontender. No organomegaly or masses felt. Normal bowel sounds heard. Central nervous system: Alert and oriented. No focal neurological deficits. Extremities: Symmetric 5 x 5 power. Skin: No rashes, lesions or ulcers Psychiatry: Judgement and insight appear normal. Mood & affect appropriate.     Data Reviewed: I have personally reviewed following labs and imaging studies  CBC: Recent Labs  Lab 10/24/20 1652 10/25/20 0525 10/26/20 0850  WBC 14.5* 14.7* 16.2*  NEUTROABS 12.8*  --  14.7*  HGB 13.1 13.5 12.3*  HCT 42.8 45.4 39.6  MCV 96.0 98.7 95.2  PLT 210 201 665    Basic Metabolic Panel: Recent Labs  Lab 10/24/20 1652 10/25/20 0525 10/26/20 0850  NA 144 147* 152*  K 4.3 4.8 4.6  CL 104 106 111  CO2 $Re'24 23 26  'mqQ$ GLUCOSE 139* 107* 128*  BUN 54* 60* 73*  CREATININE 2.55* 2.57* 3.55*  CALCIUM 8.7* 8.0* 7.9*  MG  --   --  3.0*  PHOS  --   --  5.3*   COVID-19 Labs  Recent Labs    10/24/20 1652 10/26/20 0850 10/26/20 0851  DDIMER 3.46* 7.46*  --   FERRITIN 32  --  72  LDH 187  --   --   CRP 3.2*  --  31.0*    Lab Results  Component Value Date   SARSCOV2NAA POSITIVE (A) 10/24/2020   GFR: Estimated Creatinine Clearance: 29.8 mL/min (A) (by C-G formula based on  SCr of 3.55 mg/dL (H)).  Liver Function Tests: Recent Labs  Lab 10/24/20 1652 10/25/20 0525  AST 33 39  ALT 17 19  ALKPHOS 139* 108  BILITOT 1.2 1.4*  PROT 8.3* 7.5  ALBUMIN 3.5 3.3*    CBG: No results for input(s): GLUCAP in the last 168 hours.   Recent Results (from the past 240 hour(s))  Resp Panel by RT-PCR (Flu A&B, Covid) Nasopharyngeal Swab     Status: Abnormal   Collection  Time: 10/24/20  4:52 PM   Specimen: Nasopharyngeal Swab; Nasopharyngeal(NP) swabs in vial transport medium  Result Value Ref Range Status   SARS Coronavirus 2 by RT PCR POSITIVE (A) NEGATIVE Final    Comment: RESULT CALLED TO, READ BACK BY AND VERIFIED WITH: GREEN F. 01.03.22 @ 2046 BY MECIAL J. (NOTE) SARS-CoV-2 target nucleic acids are DETECTED.  The SARS-CoV-2 RNA is generally detectable in upper respiratory specimens during the acute phase of infection. Positive results are indicative of the presence of the identified virus, but do not rule out bacterial infection or co-infection with other pathogens not detected by the test. Clinical correlation with patient history and other diagnostic information is necessary to determine patient infection status. The expected result is Negative.  Fact Sheet for Patients: EntrepreneurPulse.com.au  Fact Sheet for Healthcare Providers: IncredibleEmployment.be  This test is not yet approved or cleared by the [place] FDA and  has been authorized for detection and/or diagnosis of SARS-CoV-2 by FDA under an Emergency Use Authorization (EUA).  This EUA will remain in effect (meaning this test ca n be used) for the duration of  the COVID-19 declaration under Section 564(b)(1) of the Act, 21 U.S.C. section 360bbb-3(b)(1), unless the authorization is terminated or revoked sooner.     Influenza A by PCR NEGATIVE NEGATIVE Final   Influenza B by PCR NEGATIVE NEGATIVE Final    Comment: (NOTE) The Xpert Xpress  SARS-CoV-2/FLU/RSV plus assay is intended as an aid in the diagnosis of influenza from Nasopharyngeal swab specimens and should not be used as a sole basis for treatment. Nasal washings and aspirates are unacceptable for Xpert Xpress SARS-CoV-2/FLU/RSV testing.  Fact Sheet for Patients: EntrepreneurPulse.com.au  Fact Sheet for Healthcare Providers: IncredibleEmployment.be  This test is not yet approved or cleared by the [place] FDA and has been authorized for detection and/or diagnosis of SARS-CoV-2 by FDA under an Emergency Use Authorization (EUA). This EUA will remain in effect (meaning this test can be used) for the duration of the COVID-19 declaration under Section 564(b)(1) of the Act, 21 U.S.C. section 360bbb-3(b)(1), unless the authorization is terminated or revoked.  Performed at [facility], [place] [address], [place], [place] [address]   C Difficile Quick Screen w PCR reflex     Status: None   Collection Time: 10/24/20 10:09 PM   Specimen: STOOL  Result Value Ref Range Status   C Diff antigen NEGATIVE NEGATIVE Final   C Diff toxin NEGATIVE NEGATIVE Final   C Diff interpretation No C. difficile detected.  Final    Comment: Performed at [facility], [place] [address], [place], [place] [address]  Blood Culture (routine x 2)     Status: None (Preliminary result)   Collection Time: 10/24/20 10:30 PM   Specimen: BLOOD  Result Value Ref Range Status   Specimen Description   Final    BLOOD RIGHT ANTECUBITAL Performed at [place] [address] [place], [place] [address]    Special Requests   Final    BOTTLES DRAWN AEROBIC AND ANAEROBIC Blood Culture adequate volume Performed at [facility] [address] [place], [place] [address]    Culture   Final    NO GROWTH 1 DAY Performed at [facility], [place] [address], [place], [place] [address]     Report Status PENDING  Incomplete  Blood Culture (routine x 2)     Status: None (Preliminary result)   Collection Time: 10/24/20 10:30 PM  Specimen: BLOOD  Result Value Ref Range Status   Specimen Description   Final    BLOOD BLOOD LEFT FOREARM Performed at [place] [address] [place], [place] [address]    Special Requests   Final    BOTTLES DRAWN AEROBIC AND ANAEROBIC Blood Culture results may not be optimal due to an inadequate volume of blood received in culture bottles Performed at [facility] [place] [address] [place], [place] [address]    Culture   Final    NO GROWTH 1 DAY Performed at [facility], [place] [address] [place], [place] [address]    Report Status PENDING  Incomplete  MRSA PCR Screening     Status: None   Collection Time: 10/25/20  6:04 PM   Specimen: Nasal Mucosa; Nasopharyngeal  Result Value Ref Range Status   MRSA by PCR NEGATIVE NEGATIVE Final    Comment:        The GeneXpert MRSA Assay (FDA approved for NASAL specimens only), is one component of a comprehensive MRSA colonization surveillance program. It is not intended to diagnose MRSA infection nor to guide or monitor treatment for MRSA infections. Performed at [facility], [place] [address], [place], [place] [address]          Radiology Studies: CT Head Wo Contrast  Result Date: 10/25/2020 CLINICAL DATA:  COVID-19 positive 10/10/2020, traumatic brain injury, altered level of consciousness EXAM: CT HEAD WITHOUT CONTRAST TECHNIQUE: Contiguous axial images were obtained from the base of the skull through the vertex without intravenous contrast. COMPARISON:  None. FINDINGS: Brain: Diffuse encephalomalacia throughout the left cerebral hemisphere consistent with postsurgical and posttraumatic change. No signs of acute infarct or hemorrhage. Ex vacuo dilatation of the lateral ventricle. Midline structures are unremarkable. No acute  extra-axial fluid collections. No mass effect. Vascular: No hyperdense vessel or unexpected calcification. Skull: Previous left frontotemporal craniectomy. No acute fractures. Sinuses/Orbits: Minimal polypoid mucosal thickening right maxillary sinus. Remaining sinuses are clear. Other: Reconstructed images demonstrate no additional findings. IMPRESSION: 1. Large left frontotemporal craniectomy, with underlying left cerebral hemispheric encephalomalacia. 2. No acute infarct or hemorrhage. Electronically Signed   By: Randa Ngo M.D.   On: 10/25/2020 03:59   US RENAL  Result Date: 10/26/2020 CLINICAL DATA:  Acute renal failure. EXAM: RENAL / URINARY TRACT ULTRASOUND COMPLETE COMPARISON:  Abdominal ultrasound 04/09/2016 FINDINGS: Right Kidney: Renal measurements: 10.6 x 5.1 x 5.1 cm = volume: 143 mL. Diffuse cortical thinning with increased echogenicity. No mass or hydronephrosis visualized. Left Kidney: Renal measurements: 9.1 x 5.6 x 5.2 cm = volume: 140 mL. Diffuse cortical thinning with increased echogenicity. No mass or hydronephrosis visualized. Bladder: Decompressed by Foley catheter. Other: None. IMPRESSION: Bilateral renal cortical thinning without hydronephrosis. Electronically Signed   By: Misty Stanley M.D.   On: 10/26/2020 10:03   DG CHEST PORT 1 VIEW  Result Date: 10/26/2020 CLINICAL DATA:  Respiratory failure.  COVID. EXAM: PORTABLE CHEST 1 VIEW COMPARISON:  10/24/2020. FINDINGS: Patient is rotated to the right. Thoracic aorta again noted be tortuous. Stable cardiomegaly. Low lung volumes with persistent bilateral atelectasis. Atelectasis progressed in the right lung base. Persistent interstitial prominence left lung. Small right pleural effusion cannot be excluded. No pneumothorax. No acute bony abnormality identified. IMPRESSION: 1. Low lung volumes with persistent bilateral atelectasis. Atelectasis progressed in the right lung base. Persistent interstitial prominence left lung. Small right  pleural effusion cannot be excluded. 2. Stable cardiomegaly. Electronically Signed   By: Marcello Moores  Register   On: 10/26/2020 05:34   DG Chest Port 1 View  Result Date: 10/24/2020 CLINICAL DATA:  70 year old male with positive COVID-19 and hypoxia. EXAM: PORTABLE CHEST 1 VIEW COMPARISON:  Chest radiograph dated 03/30/2016. FINDINGS: Diffuse interstitial coarsening. Bilateral mid to lower lung field streaky densities, likely atelectasis. Developing infiltrate is not excluded. No lobar consolidation, pleural effusion or pneumothorax. The right costophrenic angle is obscured due to superimposed support device. Stable cardiomegaly. Hiatal hernia. No acute osseous pathology. IMPRESSION: Bilateral mid to lower lung field streaky densities, likely atelectasis. Developing infiltrate is not excluded. Electronically Signed   By: Anner Crete M.D.   On: 10/24/2020 17:59   DG Abd 2 Views  Result Date: 10/24/2020 CLINICAL DATA:  COVID positive with low oxygen saturation and abdominal distension. EXAM: ABDOMEN - 2 VIEW COMPARISON:  None. FINDINGS: The bowel gas pattern is normal. A large amount of stool is seen within the distal sigmoid colon. There is no evidence of free air. No radio-opaque calculi or other significant radiographic abnormality is seen. IMPRESSION: 1. No evidence of bowel obstruction. Electronically Signed   By: Virgina Norfolk M.D.   On: 10/24/2020 21:42   VAS [place] LOWER EXTREMITY VENOUS (DVT)  Result Date: 10/25/2020  Lower Venous DVT Study Other Indications: Covid, D-Dimer. Limitations: Body habitus and Pt unresponsive to instructions. Comparison Study: No previous exam Performing Technologist: Vonzell Schlatter RVT  Examination Guidelines: A complete evaluation includes B-mode imaging, spectral Doppler, color Doppler, and power Doppler as needed of all accessible portions of each vessel. Bilateral testing is considered an integral part of a complete examination. Limited examinations for reoccurring  indications may be performed as noted. The reflux portion of the exam is performed with the patient in reverse Trendelenburg.  +---------+---------------+---------+-----------+----------+--------------+ RIGHT    CompressibilityPhasicitySpontaneityPropertiesThrombus Aging +---------+---------------+---------+-----------+----------+--------------+ CFV      Full           Yes      Yes                                 +---------+---------------+---------+-----------+----------+--------------+ SFJ      Full                                                        +---------+---------------+---------+-----------+----------+--------------+ FV Prox  Full                                                        +---------+---------------+---------+-----------+----------+--------------+ FV Mid   Full                                                        +---------+---------------+---------+-----------+----------+--------------+ FV DistalFull                                                        +---------+---------------+---------+-----------+----------+--------------+  PFV      Full                                                        +---------+---------------+---------+-----------+----------+--------------+ POP      Full           Yes      Yes                                 +---------+---------------+---------+-----------+----------+--------------+ PTV      Full                                                        +---------+---------------+---------+-----------+----------+--------------+ PERO     Full                                                        +---------+---------------+---------+-----------+----------+--------------+   +---------+---------------+---------+-----------+----------+-------------------+ LEFT     CompressibilityPhasicitySpontaneityPropertiesThrombus Aging       +---------+---------------+---------+-----------+----------+-------------------+ CFV      Full           Yes      Yes                                      +---------+---------------+---------+-----------+----------+-------------------+ SFJ      Full                                                             +---------+---------------+---------+-----------+----------+-------------------+ FV Prox  Full                                                             +---------+---------------+---------+-----------+----------+-------------------+ FV Mid   Full                                                             +---------+---------------+---------+-----------+----------+-------------------+ FV DistalFull                                                             +---------+---------------+---------+-----------+----------+-------------------+ PFV      Full                                                             +---------+---------------+---------+-----------+----------+-------------------+   POP                                                   Not well visualized +---------+---------------+---------+-----------+----------+-------------------+ PTV      Full                                                             +---------+---------------+---------+-----------+----------+-------------------+ PERO     Full                                                             +---------+---------------+---------+-----------+----------+-------------------+   Left Technical Findings: Not visualized segments include Popliteal vein.   Summary: RIGHT: - There is no evidence of deep vein thrombosis in the lower extremity.  - No cystic structure found in the popliteal fossa.  LEFT: - There is no evidence of deep vein thrombosis in the lower extremity. However, portions of this examination were limited- see technologist comments above.  - No cystic structure found  in the popliteal fossa.  *See table(s) above for measurements and observations. Electronically signed by Curt Jews MD on 10/25/2020 at 2:02:01 PM.    Final    [place] EKG SITE RITE  Result Date: 10/24/2020 If Site Rite image not attached, placement could not be confirmed due to current cardiac rhythm.       Scheduled Meds: . amiodarone  200 mg Oral Daily  . aspirin EC  81 mg Oral Daily  . bethanechol  10 mg Oral TID  . chlorhexidine  15 mL Mouth Rinse BID  . Chlorhexidine Gluconate Cloth  6 each Topical Daily  . cloBAZam  40 mg Oral Daily  . clopidogrel  75 mg Oral Daily  . digoxin  0.125 mg Intravenous Daily  . finasteride  5 mg Oral Daily  . gabapentin  300 mg Oral QHS  . guaiFENesin  400 mg Oral BID  . Ipratropium-Albuterol  2 puff Inhalation Q6H  . lamoTRIgine  400 mg Oral BID  . levothyroxine  56 mcg Intravenous Q0600  . loratadine  10 mg Oral Daily  . mouth rinse  15 mL Mouth Rinse q12n4p  . methylPREDNISolone (SOLU-MEDROL) injection  81.25 mg Intravenous BID  . montelukast  10 mg Oral QHS  . sodium chloride flush  3 mL Intravenous Q12H  . tamsulosin  0.8 mg Oral QPC supper   Continuous Infusions: . dextrose 125 mL/hr at 10/26/20 1047  . levETIRAcetam Stopped (10/26/20 0950)  . levETIRAcetam Stopped (10/25/20 2252)  . piperacillin-tazobactam (ZOSYN)  IV 3.375 g (10/26/20 1104)  . remdesivir 100 mg in NS 100 mL 100 mg (10/26/20 1107)     LOS: 2 days    Time spent: 40 minutes    Irine Seal, MD [facility]   To contact the attending provider between 7A-7P or the covering provider during after hours 7P-7A, please log into the web site [email or web address] and access using universal [place] password for that web site. If you do not  have the password, please call the hospital operator.  10/26/2020, 11:17 AM

## 2020-10-26 NOTE — Progress Notes (Signed)
SLP Cancellation Note  Patient Details Name: Milburn Freeney MRN: [id number] DOB: [birth year]   Cancelled treatment:       Reason Eval/Treat Not Completed: Patient at procedure or test/unavailable. Pt currently undergoing in-room procedure with ultrasound tech waiting. Will continue efforts.  Jamis Kryder B. Murvin Natal, [facility], CCC-SLP Speech Language Pathologist Office: [phone] Pager: [phone]  Leigh Aurora 10/26/2020, 9:18 AM

## 2020-10-27 DIAGNOSIS — R6520 Severe sepsis without septic shock: Secondary | ICD-10-CM

## 2020-10-27 DIAGNOSIS — Z515 Encounter for palliative care: Secondary | ICD-10-CM

## 2020-10-27 DIAGNOSIS — Z7189 Other specified counseling: Secondary | ICD-10-CM | POA: Diagnosis not present

## 2020-10-27 DIAGNOSIS — R531 Weakness: Secondary | ICD-10-CM

## 2020-10-27 DIAGNOSIS — U071 COVID-19: Secondary | ICD-10-CM | POA: Diagnosis not present

## 2020-10-27 DIAGNOSIS — R140 Abdominal distension (gaseous): Secondary | ICD-10-CM | POA: Diagnosis not present

## 2020-10-27 DIAGNOSIS — A419 Sepsis, unspecified organism: Secondary | ICD-10-CM

## 2020-10-27 DIAGNOSIS — J9601 Acute respiratory failure with hypoxia: Secondary | ICD-10-CM | POA: Diagnosis not present

## 2020-10-27 LAB — CBC WITH DIFFERENTIAL/PLATELET
Abs Immature Granulocytes: 0.11 10*3/uL — ABNORMAL HIGH (ref 0.00–0.07)
Basophils Absolute: 0 10*3/uL (ref 0.0–0.1)
Basophils Relative: 0 %
Eosinophils Absolute: 0 10*3/uL (ref 0.0–0.5)
Eosinophils Relative: 0 %
HCT: 38.7 % — ABNORMAL LOW (ref 39.0–52.0)
Hemoglobin: 11.8 g/dL — ABNORMAL LOW (ref 13.0–17.0)
Immature Granulocytes: 1 %
Lymphocytes Relative: 6 %
Lymphs Abs: 0.9 10*3/uL (ref 0.7–4.0)
MCH: 29.6 pg (ref 26.0–34.0)
MCHC: 30.5 g/dL (ref 30.0–36.0)
MCV: 97 fL (ref 80.0–100.0)
Monocytes Absolute: 0.9 10*3/uL (ref 0.1–1.0)
Monocytes Relative: 6 %
Neutro Abs: 13.5 10*3/uL — ABNORMAL HIGH (ref 1.7–7.7)
Neutrophils Relative %: 87 %
Platelets: 163 10*3/uL (ref 150–400)
RBC: 3.99 MIL/uL — ABNORMAL LOW (ref 4.22–5.81)
RDW: 15.1 % (ref 11.5–15.5)
WBC: 15.4 10*3/uL — ABNORMAL HIGH (ref 4.0–10.5)
nRBC: 0 % (ref 0.0–0.2)

## 2020-10-27 LAB — COMPREHENSIVE METABOLIC PANEL
ALT: 38 U/L (ref 0–44)
AST: 75 U/L — ABNORMAL HIGH (ref 15–41)
Albumin: 2.7 g/dL — ABNORMAL LOW (ref 3.5–5.0)
Alkaline Phosphatase: 74 U/L (ref 38–126)
Anion gap: 10 (ref 5–15)
BUN: 61 mg/dL — ABNORMAL HIGH (ref 8–23)
CO2: 23 mmol/L (ref 22–32)
Calcium: 8 mg/dL — ABNORMAL LOW (ref 8.9–10.3)
Chloride: 110 mmol/L (ref 98–111)
Creatinine, Ser: 2.26 mg/dL — ABNORMAL HIGH (ref 0.61–1.24)
GFR, Estimated: 31 mL/min — ABNORMAL LOW (ref >60)
Glucose, Bld: 131 mg/dL — ABNORMAL HIGH (ref 70–99)
Potassium: 3.9 mmol/L (ref 3.5–5.1)
Sodium: 143 mmol/L (ref 135–145)
Total Bilirubin: 0.6 mg/dL (ref 0.3–1.2)
Total Protein: 6.7 g/dL (ref 6.5–8.1)

## 2020-10-27 LAB — PROCALCITONIN: Procalcitonin: 5.75 ng/mL

## 2020-10-27 LAB — D-DIMER, QUANTITATIVE: D-Dimer, Quant: 7.65 ug/mL-FEU — ABNORMAL HIGH (ref 0.00–0.50)

## 2020-10-27 LAB — URINE CULTURE: Culture: NO GROWTH

## 2020-10-27 LAB — FERRITIN: Ferritin: 86 ng/mL (ref 24–336)

## 2020-10-27 LAB — C-REACTIVE PROTEIN: CRP: 28.3 mg/dL — ABNORMAL HIGH (ref <1.0)

## 2020-10-27 MED ORDER — IPRATROPIUM-ALBUTEROL 20-100 MCG/ACT IN AERS
2.0000 | INHALATION_SPRAY | Freq: Three times a day (TID) | RESPIRATORY_TRACT | Status: DC
  Administered 2020-10-28 (×2): 2 via RESPIRATORY_TRACT
  Filled 2020-10-27: qty 4

## 2020-10-27 NOTE — Progress Notes (Signed)
  Speech Language Pathology Treatment: Dysphagia  Patient Details Name: Austin Hartman MRN: [id number] DOB: 10/11/1951 Today's Date: 10/27/2020 Time: 9379-0240 SLP Time Calculation (min) (ACUTE ONLY): 12 min  Assessment / Plan / Recommendation Clinical Impression  F/u after yesterday's clinical swallow eval. Pt alert, communicating in two-word phrases and able to make needs known.  Provided with trials of solids, purees, and thin liquids. RN reports has been drinking fluids all day and doing well.  Has mild expiratory wheeze; BS diminished. On 3 liters [place].  Mild cough noted on one occasion but no recurrence. Recommend starting a dysphagia 3 diet, thin liquids; meds whole with liquid. Spoke with sister, [name], who reports excellent appetite at baseline with no issues except occasional coughing when eating impulsively.  Will follow briefly for safety.   HPI HPI: 70yo male admitted 10/24/20 with AMS, hypoxia (70%). 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 Per [name], sister, eats anything and everything - does better drinking from a straw.      SLP Plan  Continue with current plan of care       Recommendations  Diet recommendations: Dysphagia 3 (mechanical soft);Thin liquid Liquids provided via: Straw Medication Administration: Whole meds with puree Supervision: Staff to assist with self feeding Compensations: Minimize environmental distractions Postural Changes and/or Swallow Maneuvers: Seated upright 90 degrees                Oral Care Recommendations: Oral care BID Follow up Recommendations: Skilled Nursing facility;24 hour supervision/assistance SLP Visit Diagnosis: Dysphagia, unspecified (R13.10) Plan: Continue with current plan of care       GO             Aylin Rhoads L. Samson Frederic, MA CCC/SLP Acute Rehabilitation Services Office number  [phone] Pager [phone]    [name] 10/27/2020, 5:37 PM

## 2020-10-27 NOTE — Progress Notes (Addendum)
PROGRESS NOTE    Austin Hartman  JJH:[id number] DOB: [birth year] DOA: 10/24/2020 PCP: Bonnita Nasuti, MD    Chief Complaint  Patient presents with  . Covid Positive    10/20/20    Brief Narrative:  Austin Hartman is a 70 y.o. male with PMH significant for TBI, CVA, chronic SDH, s/p craniectomy, epilepsy, and recent covid-19 diagnosis who lives in a long-term care facility.  At baseline, patient is able to transfer himself from bed to wheelchair and rolled around his wheelchair.  At baseline he is able to have a meaningful conversation. Patient was sent to the ED from [place] and rehab on 1/3 for altered mental status, hypothermia, cough, hypoxia to 70s requiring 10 L of oxygen.  In the ED, patient was hypothermic to 94, hypotensive down to 70s and was altered. Labs showed WBC count elevated to 14.5, creatinine elevated to 2.55, lactic acid elevated to 4.8 He had copious diarrhea with negative C. Difficile. EDP discussed poor prognosis with patient's sister who is HCPOA. Visitation was offered, confirmed DNR and desire to avoid invasive procedures including central lines and ventilator. IV fluids, antibiotics were given with slight improvement in blood pressure.  Pt was admitted under hospitalist service Currently care was consulted. Of note patient tested positive for Covid on 12/30 despite history of vaccination x3 latest in December   Assessment & Plan:   Principal Problem:   Severe sepsis [facility]) Active Problems:   Dysphagia   Coronary artery disease involving native coronary artery of native heart with angina pectoris (HCC)   Diarrhea   Hypertensive heart disease with heart failure (HCC)   Chronic constipation   Hypothermia   Epilepsy (HCC)   Hypotension, unspecified   At high risk for aspiration   DNR (do not resuscitate)   Leukocytosis   Hyperlipidemia   Chronic atrial fibrillation (HCC)   Subdural hematoma, chronic (HCC)   Pressure injury of skin   Abdominal  distention   Acute respiratory failure with hypoxia (HCC)   ARF (acute renal failure) (HCC)   COVID-19   Hypernatremia  1 severe sepsis with hypotension Patient met criteria for sepsis with an hypothermia, hypotension, altered mental status.  Lab work was consistent with a leukocytosis, lactic acidosis, elevated procalcitonin.  C. difficile PCR negative.  GI pathogen panel negative.  Urinalysis nitrite negative leukocytes negative.  Blood cultures pending with no growth to date.  Chest x-ray with persistent bilateral atelectasis.  Persistent interstitial prominence left lung.  Small right pleural effusion cannot be excluded.  Blood pressure improving.  Patient initially empirically on IV vancomycin and Zosyn.  Due to worsening renal function vancomycin discontinued.  Patient on IV Zosyn.  Patient seen by speech therapy with mild aspiration risk and currently on clears.  Will have speech VSS.  Renal function improving with IV fluids of D5W.  Supportive care.  Follow.    2.  Abdominal distention/watery diarrhea C. difficile PCR negative.  GI pathogen panel negative.  Diarrhea seems to be improving.  Diarrhea likely secondary to COVID-19.  Abdomen nontender to palpation.  Follow.  3.  Acute renal failure Likely secondary to a prerenal azotemia as patient had presented with severe sepsis and hypotension in the setting of diuretics of Demadex.  Last creatinine noted on epic in 2017 was 0.84.  Creatinine currently at 3.55 today with a BUN of 73 and a sodium level of 152.  Patient has been n.p.o. since admission.  Patient also noted to have diarrhea per RN.  Check  a urine sodium, urine creatinine, renal ultrasound.  Place Foley catheter.  Give a liter bolus of half-normal saline.  Place on D5W at 125 cc an hour.  Strict I's and O's.  Daily weights.  Follow urine output.  Check a renal ultrasound.  If no improvement in renal function and pending palliative care conversation with family may need to get  nephrology involved.  Monitor for now.  4.Hypernatremia Likely secondary to volume depletion.  LR discontinued.  Patient placed on D5W with improvement in sodium currently at 123.  Follow.   5.  Breakthrough COVID-19 infection/acute respiratory failure with hypoxia Patient sent to the ED from facility due to hypoxia.  No evidence of pneumonia noted on chest x-ray.  Patient with increased O2 requirements early on during the hospitalization.  Hypoxia improving patient currently on 4 L nasal cannula with sats of 96%.  D-dimer will was worsening currently on 10 L high flow nasal cannula with sats of 96%. D-dimer elevated and worsening currently at 7.65 from 7.46 from 3.46.  CRP trending back down and currently at 28.3 from 31 from 3.2.  Continue IV Solu-Medrol 80 mg every 12 hours, empiric IV antibiotics, supportive care.  Follow.   6.  Acute metabolic encephalopathy Likely secondary to sepsis in the setting of history of CVA. Per Dr.Dahal, patient's sister stated at baseline patient is able to open meaningful conversation.  Patient more alert today.  Answering some questions appropriately.  Patient with dysarthric speech.  Patient also noted to have a prior history of TBI and seizures and on multiple mood altering medications.  Continue IV fluids, IV antibiotics, IV steroids.  Follow.    7.  Chronic diastolic CHF/hypertension Currently stable.  Patient noted to be on metoprolol and torsemide twice daily which on hold due to hypotension.  Continue IV fluids.  Follow.    8.  Chronic atrial flutter/atrial fibrillation Continue digoxin.  Not an anticoagulation candidate due to history of SDH.  Follow.   9.  History of CVA/coronary artery disease Continue aspirin and Plavix and statin.  10.  Elevated D-dimer with asymmetric edema Lower extremity Dopplers negative for DVT.  Follow.   11.  History of TBI/chronic subdural hematoma/craniectomy We will try to avoid anticoagulants in this setting despite  high risk for VTE.  Follow.  12.  Seizure disorder Continue gabapentin 300 mg daily, Keppra 1250 mg every morning, 1000 g nightly, clobazam 40 mg daily, Lamictal 400 mg twice daily  13.  BPH/urinary retention Noted to have urinary retention per RN.  Patient in acute renal failure.  Foley catheter placed.  Patient with urine output of 2.8 L over the past 24 hours.  Continue home regimen bethanechol, finasteride, Flomax.  Voiding trial in 3 to 5 days.  Follow.   14.  Depression SSRI  15.  Hypothyroidism Synthroid.  Outpatient follow-up.  16.  Dysphagia Currently on clear liquids which she seems to be tolerating.  Patient more alert today.  Will have patient reassessed by speech therapy to see if diet can be advanced.   17.  Goals of care discussion Dr. Pietro Cassis discussed with patient's [facility] POA who understands patient with a grim prognosis.  It is noted that patient wishes to avoid invasive procedures including central lines. Patient chronically ill at a long-term care facility x12 years, poor functional status, now with acute COVID-19 infection, sepsis, multiorgan failure with acute renal failure, encephalopathy, hypoxia.  Palliative care consulted and goals of care discussion pending.   DVT prophylaxis: SCDs  Code Status: DNR Family Communication: Updated patient.  Spoke with daughter Zigmund Graclynn Vanantwerp via telephone and updated her.   Disposition:   Status is: Inpatient    Dispo: The patient is from: Long-term care facility              Anticipated d/c is to: Long-term care facility              Anticipated d/c date is: To be determined              Patient currently with not stable for discharge.         Consultants:   Palliative care pending  Procedures:   CT head 10/25/2020  Abdominal films 10/24/2020  Chest x-ray 10/24/2020, 10/26/2018  Renal ultrasound 10/26/2020  Lower extremity Dopplers 10/25/2020  Antimicrobials:   IV Zosyn 10/24/2020>>>>  IV remdesivir 10/25/2020>>>> 10/30/2020  IV  vancomycin 10/24/2020>>>> 10/25/2020   Subjective: More alert. Sitting up. Denies CP.  Patient states shortness of breath is improving.  Patient with dysarthric speech.  Complains of pain in lower extremities with SCDs.   Objective: Vitals:   10/27/20 0338 10/27/20 0400 10/27/20 0500 10/27/20 0800  BP:  (!) 140/97    Pulse:  91    Resp:  13    Temp: (!) 97.3 F (36.3 C)   98.2 F (36.8 C)  TempSrc: Axillary   Oral  SpO2:  95%    Weight:   (!) 136.5 kg     Intake/Output Summary (Last 24 hours) at 10/27/2020 1117 Last data filed at 10/27/2020 0600 Gross per 24 hour  Intake 2880.37 ml  Output 2050 ml  Net 830.37 ml   Filed Weights   10/26/20 0500 10/27/20 0500  Weight: 134.3 kg (!) 136.5 kg    Examination:  General exam: NAD.  Deformed head secondary to history of craniotomy Respiratory system: Coarse breath sounds anterior lung fields.  Some rhonchi.  No wheezing.  Speaking in full sentences.  No use of accessory muscles of respiration.  Cardiovascular system: Regular rate and rhythm no murmurs rubs or gallops.  No JVD.  Right lower extremity larger than left lower extremity.  No significant pitting edema. Gastrointestinal system: Abdomen is soft, nontender, nondistended, positive bowel sounds.  No rebound.  No guarding.  Central nervous system: Alert and oriented.  Dysarthric speech.  No focal neurological deficits. Extremities: Right lower extremity larger than left lower extremity.  Skin: No rashes, lesions or ulcers Psychiatry: Judgement and insight appear fair. Mood & affect appropriate.     Data Reviewed: I have personally reviewed following labs and imaging studies  CBC: Recent Labs  Lab 10/24/20 1652 10/25/20 0525 10/26/20 0850 10/27/20 0346  WBC 14.5* 14.7* 16.2* 15.4*  NEUTROABS 12.8*  --  14.7* 13.5*  HGB 13.1 13.5 12.3* 11.8*  HCT 42.8 45.4 39.6 38.7*  MCV 96.0 98.7 95.2 97.0  PLT 210 201 180 469    Basic Metabolic Panel: Recent Labs  Lab  10/24/20 1652 10/25/20 0525 10/26/20 0850 10/27/20 0346  NA 144 147* 152* 143  K 4.3 4.8 4.6 3.9  CL 104 106 111 110  CO2 24 23 26 23   GLUCOSE 139* 107* 128* 131*  BUN 54* 60* 73* 61*  CREATININE 2.55* 2.57* 3.55* 2.26*  CALCIUM 8.7* 8.0* 7.9* 8.0*  MG  --   --  3.0*  --   PHOS  --   --  5.3*  --    COVID-19 Labs  Recent Labs    10/24/20 1652 10/26/20  7829 10/26/20 0851 10/27/20 0346  DDIMER 3.46* 7.46*  --  7.65*  FERRITIN 32  --  72 86  LDH 187  --   --   --   CRP 3.2*  --  31.0* 28.3*    Lab Results  Component Value Date   SARSCOV2NAA POSITIVE (A) 10/24/2020   GFR: Estimated Creatinine Clearance: 47.2 mL/min (A) (by C-G formula based on SCr of 2.26 mg/dL (H)).  Liver Function Tests: Recent Labs  Lab 10/24/20 1652 10/25/20 0525 10/27/20 0346  AST 33 39 75*  ALT 17 19 38  ALKPHOS 139* 108 74  BILITOT 1.2 1.4* 0.6  PROT 8.3* 7.5 6.7  ALBUMIN 3.5 3.3* 2.7*    CBG: No results for input(s): GLUCAP in the last 168 hours.   Recent Results (from the past 240 hour(s))  Resp Panel by RT-PCR (Flu A&B, Covid) Nasopharyngeal Swab     Status: Abnormal   Collection Time: 10/24/20  4:52 PM   Specimen: Nasopharyngeal Swab; Nasopharyngeal(NP) swabs in vial transport medium  Result Value Ref Range Status   SARS Coronavirus 2 by RT PCR POSITIVE (A) NEGATIVE Final    Comment: RESULT CALLED TO, READ BACK BY AND VERIFIED WITH: GREEN F. 01.03.22 @ 2046 BY MECIAL J. (NOTE) SARS-CoV-2 target nucleic acids are DETECTED.  The SARS-CoV-2 RNA is generally detectable in upper respiratory specimens during the acute phase of infection. Positive results are indicative of the presence of the identified virus, but do not rule out bacterial infection or co-infection with other pathogens not detected by the test. Clinical correlation with patient history and other diagnostic information is necessary to determine patient infection status. The expected result is Negative.  Fact  Sheet for Patients: EntrepreneurPulse.com.au  Fact Sheet for Healthcare Providers: IncredibleEmployment.be  This test is not yet approved or cleared by the [place] FDA and  has been authorized for detection and/or diagnosis of SARS-CoV-2 by FDA under an Emergency Use Authorization (EUA).  This EUA will remain in effect (meaning this test ca n be used) for the duration of  the COVID-19 declaration under Section 564(b)(1) of the Act, 21 U.S.C. section 360bbb-3(b)(1), unless the authorization is terminated or revoked sooner.     Influenza A by PCR NEGATIVE NEGATIVE Final   Influenza B by PCR NEGATIVE NEGATIVE Final    Comment: (NOTE) The Xpert Xpress SARS-CoV-2/FLU/RSV plus assay is intended as an aid in the diagnosis of influenza from Nasopharyngeal swab specimens and should not be used as a sole basis for treatment. Nasal washings and aspirates are unacceptable for Xpert Xpress SARS-CoV-2/FLU/RSV testing.  Fact Sheet for Patients: EntrepreneurPulse.com.au  Fact Sheet for Healthcare Providers: IncredibleEmployment.be  This test is not yet approved or cleared by the [place] FDA and has been authorized for detection and/or diagnosis of SARS-CoV-2 by FDA under an Emergency Use Authorization (EUA). This EUA will remain in effect (meaning this test can be used) for the duration of the COVID-19 declaration under Section 564(b)(1) of the Act, 21 U.S.C. section 360bbb-3(b)(1), unless the authorization is terminated or revoked.  Performed at [facility] [place] [address] [place], [place] [address]   C Difficile Quick Screen w PCR reflex     Status: None   Collection Time: 10/24/20 10:09 PM   Specimen: STOOL  Result Value Ref Range Status   C Diff antigen NEGATIVE NEGATIVE Final   C Diff toxin NEGATIVE NEGATIVE Final   C Diff interpretation No C. difficile detected.  Final  Comment: Performed at [facility], [place] [address] [place], [place] [address]  Gastrointestinal Panel by PCR , Stool     Status: None   Collection Time: 10/24/20 10:09 PM   Specimen: Stool  Result Value Ref Range Status   Campylobacter species NOT DETECTED NOT DETECTED Final   Plesimonas shigelloides NOT DETECTED NOT DETECTED Final   Salmonella species NOT DETECTED NOT DETECTED Final   Yersinia enterocolitica NOT DETECTED NOT DETECTED Final   Vibrio species NOT DETECTED NOT DETECTED Final   Vibrio cholerae NOT DETECTED NOT DETECTED Final   Enteroaggregative E coli (EAEC) NOT DETECTED NOT DETECTED Final   Enteropathogenic E coli (EPEC) NOT DETECTED NOT DETECTED Final   Enterotoxigenic E coli (ETEC) NOT DETECTED NOT DETECTED Final   Shiga like toxin producing E coli (STEC) NOT DETECTED NOT DETECTED Final   Shigella/Enteroinvasive E coli (EIEC) NOT DETECTED NOT DETECTED Final   Cryptosporidium NOT DETECTED NOT DETECTED Final   Cyclospora cayetanensis NOT DETECTED NOT DETECTED Final   Entamoeba histolytica NOT DETECTED NOT DETECTED Final   Giardia lamblia NOT DETECTED NOT DETECTED Final   Adenovirus F40/41 NOT DETECTED NOT DETECTED Final   Astrovirus NOT DETECTED NOT DETECTED Final   Norovirus GI/GII NOT DETECTED NOT DETECTED Final   Rotavirus A NOT DETECTED NOT DETECTED Final   Sapovirus (I, II, IV, and V) NOT DETECTED NOT DETECTED Final    Comment: Performed at [facility], [address] [place], [place] [address]  Blood Culture (routine x 2)     Status: None (Preliminary result)   Collection Time: 10/24/20 10:30 PM   Specimen: BLOOD  Result Value Ref Range Status   Specimen Description   Final    BLOOD RIGHT ANTECUBITAL Performed at [facility], [place] [address] [place], [place] [address]    Special Requests   Final    BOTTLES DRAWN AEROBIC AND ANAEROBIC Blood Culture adequate volume Performed at [place] [address] [place], [place] [address]    Culture   Final    NO GROWTH 2 DAYS Performed at [place] [address], [place], [place] [address]    Report Status PENDING  Incomplete  Blood Culture (routine x 2)     Status: None (Preliminary result)   Collection Time: 10/24/20 10:30 PM   Specimen: BLOOD  Result Value Ref Range Status   Specimen Description   Final    BLOOD BLOOD LEFT FOREARM Performed at [place] [address], [place], [place] [address]    Special Requests   Final    BOTTLES DRAWN AEROBIC AND ANAEROBIC Blood Culture results may not be optimal due to an inadequate volume of blood received in culture bottles Performed at [place] [address] [place], [place] [address]    Culture   Final    NO GROWTH 2 DAYS Performed at [place] [address], [place],  [address]    Report Status PENDING  Incomplete  MRSA PCR Screening     Status: None   Collection Time: 10/25/20  6:04 PM   Specimen: Nasal Mucosa; Nasopharyngeal  Result Value Ref Range Status   MRSA by PCR NEGATIVE NEGATIVE Final    Comment:        The GeneXpert MRSA Assay (FDA approved for NASAL specimens only), is one component of a comprehensive MRSA colonization surveillance program. It is not intended to diagnose MRSA infection nor to  guide or monitor treatment for MRSA infections. Performed at [facility], [place] [address], [place], [place] [address]   Urine culture     Status: None   Collection Time: 10/26/20 12:46 AM   Specimen: Urine, Random  Result Value Ref Range Status   Specimen Description   Final    URINE, RANDOM Performed at [place] [address] [place], [place] [address]    Special Requests   Final    NONE Performed at [facility], [place] [address] [place], [place] [address]    Culture   Final    NO GROWTH Performed at [facility], [place] [address], [place], [place] [address]    Report Status 10/27/2020 FINAL  Final         Radiology Studies: US RENAL  Result Date: 10/26/2020 CLINICAL DATA:  Acute renal failure. EXAM: RENAL / URINARY TRACT ULTRASOUND COMPLETE COMPARISON:  Abdominal ultrasound 04/09/2016 FINDINGS: Right Kidney: Renal measurements: 10.6 x 5.1 x 5.1 cm = volume: 143 mL. Diffuse cortical thinning with increased echogenicity. No mass or hydronephrosis visualized. Left Kidney: Renal measurements: 9.1 x 5.6 x 5.2 cm = volume: 140 mL. Diffuse cortical thinning with increased echogenicity. No mass or hydronephrosis visualized. Bladder: Decompressed by Foley catheter. Other: None. IMPRESSION: Bilateral renal cortical thinning without hydronephrosis. Electronically Signed   By: Misty Stanley M.D.   On: 10/26/2020 10:03   DG CHEST PORT 1 VIEW  Result Date: 10/26/2020 CLINICAL DATA:  Respiratory failure.  COVID. EXAM: PORTABLE CHEST 1 VIEW COMPARISON:  10/24/2020. FINDINGS: Patient is rotated to the right. Thoracic aorta again noted be tortuous. Stable cardiomegaly. Low lung volumes with persistent bilateral atelectasis. Atelectasis progressed in the right lung base. Persistent interstitial prominence left lung. Small right pleural effusion cannot be excluded. No pneumothorax. No acute bony abnormality identified. IMPRESSION: 1. Low lung volumes with persistent bilateral atelectasis. Atelectasis progressed in the right lung base. Persistent interstitial prominence left lung. Small right pleural effusion cannot be excluded. 2. Stable cardiomegaly. Electronically Signed   By: Marcello Moores  Register   On: 10/26/2020 05:34        Scheduled Meds: . (feeding supplement) PROSource Plus  30 mL Oral BID BM  . amiodarone  200 mg Oral Daily  . aspirin EC  81 mg Oral Daily  . bethanechol  10 mg Oral TID  . chlorhexidine  15 mL Mouth Rinse BID  . Chlorhexidine Gluconate Cloth  6 each Topical Daily  . cloBAZam  40 mg Oral  Daily  . clopidogrel  75 mg Oral Daily  . digoxin  0.125 mg Intravenous Daily  . feeding supplement  1 Container Oral BID BM  . finasteride  5 mg Oral Daily  . gabapentin  300 mg Oral QHS  . guaiFENesin  400 mg Oral BID  . Ipratropium-Albuterol  2 puff Inhalation Q6H  . lamoTRIgine  400 mg Oral BID  . levothyroxine  56 mcg Intravenous Q0600  . loratadine  10 mg Oral Daily  . mouth rinse  15 mL Mouth Rinse q12n4p  . methylPREDNISolone (SOLU-MEDROL) injection  81.25 mg Intravenous BID  . montelukast  10 mg Oral QHS  . multivitamin with minerals  1 tablet Oral Daily  . sodium chloride flush  3 mL Intravenous Q12H  . tamsulosin  0.8 mg Oral QPC supper   Continuous Infusions: . dextrose 125 mL/hr at 10/27/20 0000  . levETIRAcetam Stopped (10/26/20 0947)  . levETIRAcetam Stopped (10/26/20 2337)  .  piperacillin-tazobactam (ZOSYN)  IV Stopped (10/27/20 0732)  . remdesivir 100 mg in NS 100 mL 100 mg (10/27/20 1015)     LOS: 3 days    Time spent: 40 minutes    Irine Seal, MD [facility]   To contact the attending provider between 7A-7P or the covering provider during after hours 7P-7A, please log into the web site [email or web address] and access using universal [place] password for that web site. If you do not have the password, please call the hospital operator.  10/27/2020, 11:17 AM

## 2020-10-27 NOTE — Consult Note (Signed)
Consultation Note Date: 10/27/2020   Patient Name: Austin Hartman  DOB: Mar 10, 1951  MRN: [id number]  Age / Sex: 70 y.o., male  PCP: Galvin Proffer, MD Referring Physician: Rodolph Bong, MD  Reason for Consultation: Establishing goals of care  HPI/Patient Profile: 70 y.o. male  admitted on 10/24/2020    Clinical Assessment and Goals of Care: 70 year old gentleman with a past medical history significant for traumatic brain injury, cerebrovascular accident, chronic subdural hematoma status post craniectomy, epilepsy and recent COVID-19 diagnosis.  Patient is a resident of a long-term care facility in [place], [place].  His baseline is such that he is able to transfer himself from bed to wheelchair and he is able to roll his own wheelchair.  At baseline he is reportedly also able to have meaningful conversation.  Patient remains admitted to hospital medicine service for severe sepsis, dysphagia, recent diarrhea, acute renal failure, hypernatremia in the setting of breakthrough COVID-19 infection/acute hypoxic respiratory failure.  Patient has undergone appropriate treatments of broad-spectrum antibiotics, IV fluid resuscitation among other treatments.  His oxygen requirements have gone down and his renal indices look better today.  Palliative consultation for ongoing goals of care discussions has been requested.  Patient is sitting up in bed.  He looks reasonably alert.  He is not in any distress.  Noted to be sitting up in bed with eyes closed.  Breathing regularly.  Does not verbalize much.  Call placed and discussed with Sister [name].  Introduced palliative as follows: Palliative medicine is specialized medical care for people living with serious illness. It focuses on providing relief from the symptoms and stress of a serious illness. The goal is to improve quality of life for both the patient and the  family.  Goals of care: Broad aims of medical therapy in relation to the patient's values and preferences. Our aim is to provide medical care aimed at enabling patients to achieve the goals that matter most to them, given the circumstances of their particular medical situation and their constraints.   Brief life review performed.  Patient sister [name] states that that Burnis is a wonderful person.  She states that he still has a reasonable quality of life at his long-term care facility that is worth preserving and maintaining.  She is thankful for patient's current improvement thus far.  She is aware of the serious nature of patient's overall condition.  See additional discussions below.  Thank you for the consult.  HCPOA Sister Ranny Wiebelhaus at [phone].   SUMMARY OF RECOMMENDATIONS   Agree with DO NOT RESUSCITATE Continue current mode of care Monitor p.o. intake, activity, renal and other metabolic parameters while continuing current hospitalization.  Discussed with sister about patient's current rallying/slight recovery.  Discussed about need to monitor ongoing hospital course to best determine next steps.  Sister is encouraged by the patient's current improvement. Recommend palliative support at the patient's long-term facility in [place], [place] once he stabilizes enough to be considered for discharge.  However, if the patient has new  decline or lack of meaningful oral intake, then, at that time, patient will benefit from a more comfort focused/transfer to residential hospice approach. Thank you for the consult.  Code Status/Advance Care Planning:  DNR    Symptom Management:    as above.   Palliative Prophylaxis:   Delirium Protocol   Psycho-social/Spiritual:   Desire for further Chaplaincy support:yes  Additional Recommendations: monitor PO intake.   Prognosis:   Unable to determine  Discharge Planning: To Be Determined      Primary Diagnoses: Present on  Admission: . Severe sepsis (HCC) . Chronic atrial fibrillation (HCC) . Chronic constipation . Coronary artery disease involving native coronary artery of native heart with angina pectoris (HCC) . Diarrhea . DNR (do not resuscitate) . Hyperlipidemia . Hypertensive heart disease with heart failure (HCC) . Hypotension, unspecified . Hypothermia . Leukocytosis . Subdural hematoma, chronic (HCC)   I have reviewed the medical record, interviewed the patient and family, and examined the patient. The following aspects are pertinent.  Past Medical History:  Diagnosis Date  . Abdominal pain, generalized 03/29/2016  . Altered mental state 03/29/2016  . At high risk for aspiration 03/29/2016  . CHF (congestive heart failure) (HCC)   . Chronic constipation 03/29/2016  . Coronary artery disease   . Diarrhea   . DNR (do not resuscitate) 03/29/2016  . Dysphagia   . Epilepsy (HCC) 03/29/2016  . Essential hypertension   . Hemiplegia (HCC) 12/13/2011  . History of traumatic brain injury   . Hyperlipidemia 06/30/2015  . Hypertension   . Hypotension, unspecified 03/29/2016  . Hypothermia 03/29/2016  . Left hemiparesis (HCC) 03/29/2016  . Leukocytosis 03/29/2016  . 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 Partial epilepsy with impairment of consciousness, intractable (HCC) 12/13/2011  . Sepsis (HCC)   . Stroke (HCC)   . TBI (traumatic brain injury) (HCC)   . Typical atrial flutter (HCC) 05/07/2016   Social History   Socioeconomic History  . Marital status: Single    Spouse name: Not on file  . Number of children: Not on file  . Years of education: Not on file  . Highest education level: Not on file  Occupational History  . Not on file  Tobacco Use  . Smoking status: Never Smoker  . Smokeless tobacco: Never Used  Vaping Use  . Vaping Use: Never used  Substance and Sexual  Activity  . Alcohol use: No  . Drug use: No  . Sexual activity: Not on file  Other Topics Concern  . Not on file  Social History Narrative   Lives in a skilled facility      Wheelchair assisted             Social Determinants of Health   Financial Resource Strain: Not on file  Food Insecurity: Not on file  Transportation Needs: Not on file  Physical Activity: Not on file  Stress: Not on file  Social Connections: Not on file   Family History  Problem Relation Age of Onset  . Hypertension Mother   . Lung cancer Mother   . Heart attack Father   . Hypertension Father   . Hyperlipidemia Father    Scheduled Meds: . (feeding supplement) PROSource Plus  30 mL Oral BID BM  . amiodarone  200 mg Oral Daily  . aspirin EC  81 mg Oral Daily  . bethanechol  10 mg Oral TID  . chlorhexidine  15 mL Mouth Rinse BID  . Chlorhexidine Gluconate Cloth  6 each Topical Daily  . cloBAZam  40 mg Oral Daily  . clopidogrel  75 mg Oral Daily  . digoxin  0.125 mg Intravenous Daily  . feeding supplement  1 Container Oral BID BM  . finasteride  5 mg Oral Daily  . gabapentin  300 mg Oral QHS  . guaiFENesin  400 mg Oral BID  . Ipratropium-Albuterol  2 puff Inhalation Q6H  . lamoTRIgine  400 mg Oral BID  . levothyroxine  56 mcg Intravenous Q0600  . loratadine  10 mg Oral Daily  . mouth rinse  15 mL Mouth Rinse q12n4p  . methylPREDNISolone (SOLU-MEDROL) injection  81.25 mg Intravenous BID  . montelukast  10 mg Oral QHS  . multivitamin with minerals  1 tablet Oral Daily  . sodium chloride flush  3 mL Intravenous Q12H  . tamsulosin  0.8 mg Oral QPC supper   Continuous Infusions: . dextrose 125 mL/hr at 10/27/20 1156  . levETIRAcetam Stopped (10/27/20 1253)  . levETIRAcetam Stopped (10/26/20 2337)  . piperacillin-tazobactam (ZOSYN)  IV Stopped (10/27/20 0732)  . remdesivir 100 mg in NS 100 mL Stopped (10/27/20 1045)   PRN Meds:.albuterol, chlorpheniramine-HYDROcodone,  guaiFENesin-dextromethorphan Medications Prior to Admission:  Prior to Admission medications   Medication Sig Start Date End Date Taking? Authorizing Provider  acetaminophen (TYLENOL) 325 MG tablet Take 650 mg by mouth every 6 (six) hours as needed for moderate pain.   Yes [provider]  amiodarone (PACERONE) 200 MG tablet Take 200 mg by mouth daily.    Yes [provider]  ammonium lactate (AMLACTIN) 12 % cream Apply 1 g topically at bedtime. Both legs/feet   Yes [provider]  aspirin EC 81 MG tablet Take 81 mg by mouth daily.   Yes [provider]  atorvastatin (LIPITOR) 20 MG tablet Take 20 mg by mouth daily.   Yes [provider]  bethanechol (URECHOLINE) 10 MG tablet Take 10 mg by mouth 3 (three) times daily.    Yes [provider]  bisacodyl (DULCOLAX) 10 MG suppository Place 10 mg rectally 2 (two) times daily as needed for moderate constipation.   Yes [provider]  cloBAZam (ONFI) 10 MG tablet Take 40 mg by mouth daily.   Yes [provider]  clopidogrel (PLAVIX) 75 MG tablet Take 75 mg by mouth daily.   Yes [provider]  Cranberry 450 MG CAPS Take 450 mg by mouth daily.   Yes [provider]  digoxin (LANOXIN) 0.125 MG tablet Take 125 mcg daily by mouth.    Yes [provider]  escitalopram (LEXAPRO) 10 MG tablet Take 10 mg by mouth daily.   Yes [provider]  finasteride (PROSCAR) 5 MG tablet Take 5 mg by mouth daily.   Yes [provider]  gabapentin (NEURONTIN) 300 MG capsule Take 300 mg by mouth at bedtime.   Yes [provider]  guaifenesin (HUMIBID E) 400 MG TABS tablet Take 400 mg by mouth 2 (two) times daily.   Yes [provider]  lamoTRIgine (LAMICTAL) 200 MG tablet Take 400 mg by mouth 2 (two) times daily.   Yes [provider]  levETIRAcetam (KEPPRA) 1000 MG tablet Take 1,000 mg by mouth 2 (two) times daily.    Yes  [provider]  levETIRAcetam (KEPPRA) 250 MG tablet Take 250 mg by mouth daily. Takes along with 1000 mg tablet to equal 1250 mg in the  morning.   Yes [provider]  Levothyroxine Sodium 112 MCG CAPS Take 112 mcg by mouth daily before breakfast.    Yes [provider]  linaclotide (LINZESS) 290 MCG CAPS capsule Take 290 mcg by mouth daily.   Yes [provider]  loratadine (CLARITIN) 10 MG tablet Take 10 mg by mouth daily.   Yes [provider]  Magnesium Hydroxide (MILK OF MAGNESIA PO) Take 30 mLs by mouth daily as needed (constipation).   Yes [provider]  meloxicam (MOBIC) 15 MG tablet Take 15 mg by mouth daily.   Yes [provider]  Menthol, Topical Analgesic, (BIOFREEZE) 4 % GEL Apply 1 application topically 2 (two) times daily.   Yes [provider]  metoprolol tartrate (LOPRESSOR) 25 MG tablet Take 12.5 mg by mouth 3 (three) times daily.    Yes [provider]  montelukast (SINGULAIR) 10 MG tablet Take 10 mg by mouth at bedtime.   Yes [provider]  Multiple Vitamins-Minerals (MULTIVITAMIN PO) Take 1 tablet by mouth daily.   Yes [provider]  pantoprazole (PROTONIX) 20 MG tablet Take 20 mg by mouth daily.   Yes [provider]  polyethylene glycol (MIRALAX / GLYCOLAX) packet Take 17 g by mouth daily.   Yes [provider]  sennosides-docusate sodium (SENOKOT-S) 8.6-50 MG tablet Take 2 tablets by mouth 2 (two) times daily.   Yes [provider]  sodium fluoride (PREVIDENT 5000 PLUS) 1.1 % CREA dental cream Place 1 application onto teeth 2 (two) times daily.   Yes [provider]  tamsulosin (FLOMAX) 0.4 MG CAPS capsule Take 0.8 mg by mouth daily after supper.   Yes [provider]  torsemide (DEMADEX) 20 MG tablet Take 1 tablet (20 mg total) 2 (two) times daily by mouth. Patient taking differently: Take 40 mg by mouth 2 (two) times daily.  09/05/17  Yes Baldo Daub, MD  pantoprazole (PROTONIX) 40 MG tablet Take 1 tablet (40 mg total) by mouth daily. Patient not taking: No sig reported 04/03/16   Cleora Fleet, MD   Allergies  Allergen Reactions  . Acetaminophen Other (See Comments)    unknown  . Azithromycin Other (See Comments)    unknown  . Biaxin [Clarithromycin] Other (See Comments)    unknown  . Diltiazem Other (See Comments)    unknown  . Other Other (See Comments)    Darvocet-N - unknown  . Verapamil Other (See Comments)    unknown   Review of Systems Doesn't verbalize much.   Physical Exam Chronically ill-appearing gentleman sitting up in bed with eyes closed Does not appear to have nonverbal gestures of distress or discomfort: No indication of wincing grimacing furrowing of brow or restless movements Regular work of breathing Monitor noted  Vital Signs: BP (!) 162/107   Pulse 93   Temp 98.3 F (36.8 C) (Oral)   Resp 14   Wt (!) 136.5 kg   SpO2 98%   BMI 36.63 kg/m  Pain Scale: 0-10   Pain Score: 0-No pain   SpO2: SpO2: 98 % O2 Device:SpO2: 98 % O2 Flow Rate: .O2 Flow Rate (L/min): 3 L/min (decreased to 2 lpm)  IO: Intake/output summary:   Intake/Output Summary (Last 24 hours) at 10/27/2020 1537 Last data filed at 10/27/2020 1156 Gross per 24 hour  Intake 2516.87 ml  Output 2200 ml  Net 316.87 ml    LBM: Last BM Date: 10/26/20 Baseline Weight: Weight: 134.3 kg Most recent weight: Weight: Marland Kitchen)  136.5 kg     Palliative Assessment/Data:   Palliative performance scale 40%  Time In:  1430 Time Out:  1530 Time Total:  60 min.  Greater than 50%  of this time was spent counseling and coordinating care related to the above assessment and plan.  Signed by: Loistine Chance, MD   Please contact Palliative Medicine Team phone at [phone] for questions and concerns.  For individual provider: See Shea Evans

## 2020-10-27 NOTE — Progress Notes (Signed)
10/27/2020 Patient unable to successfully use Combivent inhaler/ Cindy S. Clelia Croft BSN, RN, CCRP 10/27/2020 5:52 AM

## 2020-10-28 DIAGNOSIS — Z515 Encounter for palliative care: Secondary | ICD-10-CM | POA: Diagnosis not present

## 2020-10-28 DIAGNOSIS — J9601 Acute respiratory failure with hypoxia: Secondary | ICD-10-CM | POA: Diagnosis not present

## 2020-10-28 DIAGNOSIS — U071 COVID-19: Secondary | ICD-10-CM | POA: Diagnosis not present

## 2020-10-28 DIAGNOSIS — R140 Abdominal distension (gaseous): Secondary | ICD-10-CM | POA: Diagnosis not present

## 2020-10-28 DIAGNOSIS — Z7189 Other specified counseling: Secondary | ICD-10-CM | POA: Diagnosis not present

## 2020-10-28 DIAGNOSIS — R6520 Severe sepsis without septic shock: Secondary | ICD-10-CM | POA: Diagnosis not present

## 2020-10-28 DIAGNOSIS — A419 Sepsis, unspecified organism: Secondary | ICD-10-CM | POA: Diagnosis not present

## 2020-10-28 LAB — CBC WITH DIFFERENTIAL/PLATELET
Abs Immature Granulocytes: 0.16 10*3/uL — ABNORMAL HIGH (ref 0.00–0.07)
Basophils Absolute: 0.2 10*3/uL — ABNORMAL HIGH (ref 0.0–0.1)
Basophils Relative: 1 %
Eosinophils Absolute: 0 10*3/uL (ref 0.0–0.5)
Eosinophils Relative: 0 %
HCT: 35.1 % — ABNORMAL LOW (ref 39.0–52.0)
Hemoglobin: 10.9 g/dL — ABNORMAL LOW (ref 13.0–17.0)
Immature Granulocytes: 1 %
Lymphocytes Relative: 4 %
Lymphs Abs: 0.8 10*3/uL (ref 0.7–4.0)
MCH: 29.4 pg (ref 26.0–34.0)
MCHC: 31.1 g/dL (ref 30.0–36.0)
MCV: 94.6 fL (ref 80.0–100.0)
Monocytes Absolute: 0.8 10*3/uL (ref 0.1–1.0)
Monocytes Relative: 4 %
Neutro Abs: 18.6 10*3/uL — ABNORMAL HIGH (ref 1.7–7.7)
Neutrophils Relative %: 90 %
Platelets: 169 10*3/uL (ref 150–400)
RBC: 3.71 MIL/uL — ABNORMAL LOW (ref 4.22–5.81)
RDW: 15.2 % (ref 11.5–15.5)
WBC: 20.5 10*3/uL — ABNORMAL HIGH (ref 4.0–10.5)
nRBC: 0 % (ref 0.0–0.2)

## 2020-10-28 LAB — D-DIMER, QUANTITATIVE: D-Dimer, Quant: 6.2 ug/mL-FEU — ABNORMAL HIGH (ref 0.00–0.50)

## 2020-10-28 LAB — COMPREHENSIVE METABOLIC PANEL
ALT: 38 U/L (ref 0–44)
AST: 68 U/L — ABNORMAL HIGH (ref 15–41)
Albumin: 2.7 g/dL — ABNORMAL LOW (ref 3.5–5.0)
Alkaline Phosphatase: 82 U/L (ref 38–126)
Anion gap: 10 (ref 5–15)
BUN: 55 mg/dL — ABNORMAL HIGH (ref 8–23)
CO2: 24 mmol/L (ref 22–32)
Calcium: 8.3 mg/dL — ABNORMAL LOW (ref 8.9–10.3)
Chloride: 107 mmol/L (ref 98–111)
Creatinine, Ser: 1.73 mg/dL — ABNORMAL HIGH (ref 0.61–1.24)
GFR, Estimated: 42 mL/min — ABNORMAL LOW (ref >60)
Glucose, Bld: 168 mg/dL — ABNORMAL HIGH (ref 70–99)
Potassium: 3.8 mmol/L (ref 3.5–5.1)
Sodium: 141 mmol/L (ref 135–145)
Total Bilirubin: 0.4 mg/dL (ref 0.3–1.2)
Total Protein: 6.7 g/dL (ref 6.5–8.1)

## 2020-10-28 LAB — PROCALCITONIN: Procalcitonin: 3.29 ng/mL

## 2020-10-28 LAB — FERRITIN: Ferritin: 105 ng/mL (ref 24–336)

## 2020-10-28 LAB — C-REACTIVE PROTEIN: CRP: 19.9 mg/dL — ABNORMAL HIGH (ref <1.0)

## 2020-10-28 MED ORDER — PIPERACILLIN-TAZOBACTAM 3.375 G IVPB
3.3750 g | Freq: Three times a day (TID) | INTRAVENOUS | Status: AC
  Administered 2020-10-28 – 2020-10-31 (×10): 3.375 g via INTRAVENOUS
  Filled 2020-10-28 (×10): qty 50

## 2020-10-28 MED ORDER — SODIUM CHLORIDE 0.9% FLUSH: 10.0000 mL | INTRAVENOUS | Status: DC | PRN

## 2020-10-28 MED ORDER — LOPERAMIDE HCL 2 MG PO CAPS
2.0000 mg | ORAL_CAPSULE | ORAL | Status: DC | PRN
  Administered 2020-10-29: 2 mg via ORAL
  Filled 2020-10-28: qty 1

## 2020-10-28 MED ORDER — SODIUM CHLORIDE 0.9% FLUSH
10.0000 mL | Freq: Two times a day (BID) | INTRAVENOUS | Status: DC
  Administered 2020-10-28 – 2020-11-01 (×4): 10 mL

## 2020-10-28 MED ORDER — LOPERAMIDE HCL 1 MG/7.5ML PO SUSP
4.0000 mg | Freq: Once | ORAL | Status: AC
  Administered 2020-10-28: 4 mg via ORAL
  Filled 2020-10-28: qty 30

## 2020-10-28 MED ORDER — IPRATROPIUM-ALBUTEROL 20-100 MCG/ACT IN AERS
2.0000 | INHALATION_SPRAY | Freq: Two times a day (BID) | RESPIRATORY_TRACT | Status: DC
  Administered 2020-10-29 – 2020-11-01 (×8): 2 via RESPIRATORY_TRACT
  Filled 2020-10-28: qty 4

## 2020-10-28 NOTE — Progress Notes (Signed)
Midline placed in the left upper arm brachial vein with great blood return.

## 2020-10-28 NOTE — Progress Notes (Signed)
PROGRESS NOTE    Austin Hartman  JJH:[id number] DOB: [birth year] DOA: 10/24/2020 PCP: Bonnita Nasuti, MD    Chief Complaint  Patient presents with  . Covid Positive    10/20/20    Brief Narrative:  Austin Hartman is a 70 y.o. male with PMH significant for TBI, CVA, chronic SDH, s/p craniectomy, epilepsy, and recent covid-19 diagnosis who lives in a long-term care facility.  At baseline, patient is able to transfer himself from bed to wheelchair and rolled around his wheelchair.  At baseline he is able to have a meaningful conversation. Patient was sent to the ED from [facility] on 1[facility]3 for altered mental status, hypothermia, cough, hypoxia to 70s requiring 10 L of oxygen.  In the ED, patient was hypothermic to 94, hypotensive down to 70s and was altered. Labs showed WBC count elevated to 14.5, creatinine elevated to 2.55, lactic acid elevated to 4.8 He had copious diarrhea with negative C. Difficile. EDP discussed poor prognosis with patient's sister who is HCPOA. Visitation was offered, confirmed DNR and desire to avoid invasive procedures including central lines and ventilator. IV fluids, antibiotics were given with slight improvement in blood pressure.  Pt was admitted under hospitalist service Currently care was consulted. Of note patient tested positive for Covid on 12/30 despite history of vaccination x3 latest in December   Assessment & Plan:   Principal Problem:   Severe sepsis [facility]) Active Problems:   Dysphagia   Coronary artery disease involving native coronary artery of native heart with angina pectoris (HCC)   Diarrhea   Hypertensive heart disease with heart failure (HCC)   Chronic constipation   Hypothermia   Epilepsy (HCC)   Hypotension, unspecified   At high risk for aspiration   DNR (do not resuscitate)   Leukocytosis   Hyperlipidemia   Chronic atrial fibrillation (HCC)   Subdural hematoma, chronic (HCC)   Pressure injury of skin   Abdominal  distention   Acute respiratory failure with hypoxia (HCC)   ARF (acute renal failure) (HCC)   COVID-19   Hypernatremia  1 severe sepsis with hypotension Patient met criteria for sepsis with an hypothermia, hypotension, altered mental status.  Lab work was consistent with a leukocytosis, lactic acidosis, elevated procalcitonin.  C. difficile PCR negative.  GI pathogen panel negative.  Urinalysis nitrite negative leukocytes negative.  Blood cultures pending with no growth to date.  Chest x-ray with persistent bilateral atelectasis.  Persistent interstitial prominence left lung.  Small right pleural effusion cannot be excluded.  Blood pressure improving.  Patient initially empirically on IV vancomycin and Zosyn.  Due to worsening renal function vancomycin discontinued.  Patient on IV Zosyn.  Patient seen by speech therapy with mild aspiration risk and currently on dysphagia 3 diet. Renal function improving with IV fluids of D5W.  Supportive care.  Follow.    2.  Abdominal distention/watery diarrhea C. difficile PCR negative.  GI pathogen panel negative.  Patient with ongoing diarrhea per RN.  Diarrhea likely secondary to COVID-19.  Imodium 4 mg p.o. x1.  Imodium as needed.  3.  Acute renal failure Likely secondary to a prerenal azotemia as patient had presented with severe sepsis and hypotension in the setting of diuretics of Demadex.  Last creatinine noted on epic in 2017 was 0.84.  Creatinine currently at 1.73 from 2.26 from 3.55.  Patient was n.p.o. early on during the hospitalization, seen by speech therapy and currently on a dysphagia 3 diet.  Renal ultrasound negative for hydronephrosis.  Patient also noted to have diarrhea per RN.  Decrease IV fluids of D5W to 100cc/hr.  Strict I's and O's.  Daily weights.  Follow.  4.Hypernatremia Likely secondary to volume depletion.  LR discontinued.  Patient placed on D5W with improvement in sodium currently at 141.  Decrease IV fluids to D5W at 100cc/hr.   Follow.   5.  Breakthrough COVID-19 infection/acute respiratory failure with hypoxia Patient sent to the ED from facility due to hypoxia.  No evidence of pneumonia noted on chest x-ray.  Patient with increased O2 requirements early on during the hospitalization.  Hypoxia improving patient currently on 4 L nasal cannula with sats of 95%.  D-dimer elevated and initially was worsening by trending back down currently at 6.20 from 7.65 from 7.46 from 3.46.  CRP trending back down and currently at 19.9 from 28.3 from 31 from 3.2.  Continue IV Solu-Medrol 80 mg every 12 hours, empiric IV antibiotics, supportive care.  Follow.   6.  Acute metabolic encephalopathy Likely secondary to sepsis in the setting of history of CVA. Per Dr.Dahal, patient's sister stated at baseline patient is able to open meaningful conversation.  Patient more alert today.  Answering some questions appropriately.  Patient with dysarthric speech.  Patient also noted to have a prior history of TBI and seizures and on multiple mood altering medications.  Continue IV fluids, IV antibiotics, IV steroids.  Follow.    7.  Chronic diastolic CHF/hypertension Currently stable.  Patient noted to be on metoprolol and torsemide twice daily which on hold due to hypotension.  Decrease IV fluids to 100 cc/h.  Follow.    8.  Chronic atrial flutter/atrial fibrillation Continue digoxin.  Not an anticoagulation candidate due to history of SDH.  Follow.   9.  History of CVA/coronary artery disease Continue Plavix, statin, aspirin.   10.  Elevated D-dimer with asymmetric edema Lower extremity Dopplers negative for DVT.  Follow.   11.  History of TBI/chronic subdural hematoma/craniectomy We will try to avoid anticoagulants in this setting despite high risk for VTE.  Follow.  12.  Seizure disorder Stable.  No noted seizures.  Continue gabapentin 300 mg daily, Keppra 1250 mg every morning, 1000 g nightly, clobazam 40 mg daily, Lamictal 400 mg twice  daily  13.  BPH/urinary retention Noted to have urinary retention per RN.  Patient in acute renal failure.  Foley catheter placed.  Patient with urine output of 2.3 L over the past 24 hours.  Continue home regimen bethanechol, finasteride, Flomax.  Voiding trial in 3 to 5 days.  Follow.   14.  Depression SSRI  15.  Hypothyroidism Continue Synthroid.   16.  Dysphagia Currently on dysphagia 3 diet.  Patient has been assessed by SLP.  Aspiration precautions.   17.  Goals of care discussion Dr. Pietro Cassis discussed with patient's [facility] POA who understands patient with a grim prognosis.  It is noted that patient wishes to avoid invasive procedures including central lines. Patient chronically ill at a long-term care facility x12 years, poor functional status, now with acute COVID-19 infection, sepsis, multiorgan failure with acute renal failure, encephalopathy, hypoxia.  Palliative care consulted and goals of care discussion.  As patient slowly continues to improve will likely require palliative care to follow-up to long-term care facility when discharged.     DVT prophylaxis: SCDs Code Status: DNR Family Communication: Updated patient.  No family at bedside.  Disposition:   Status is: Inpatient    Dispo: The patient is from: Long-term  care facility              Anticipated d/c is to: Long-term care facility              Anticipated d/c date is: To be determined              Patient currently not stable for discharge.         Consultants:   Palliative care: Dr. Rowe Pavy 10/27/2020  Procedures:   CT head 10/25/2020  Abdominal films 10/24/2020  Chest x-ray 10/24/2020, 10/26/2018  Renal ultrasound 10/26/2020  Lower extremity Dopplers 10/25/2020  Antimicrobials:   IV Zosyn 10/24/2020>>>>  IV remdesivir 10/25/2020>>>> 10/30/2020  IV vancomycin 10/24/2020>>>> 10/25/2020   Subjective: Alert.  Sitting up.  Dysarthric speech.  Denies any chest pain.  No shortness of breath.  Asking for chocolate milk.   Patient with ongoing diarrhea.  Objective: Vitals:   10/28/20 0453 10/28/20 0500 10/28/20 0600 10/28/20 0900  BP:  133/78 109/70   Pulse: 89 89 91   Resp: _0 Temp: (!) 96.9 F (36.1 C)   98 F (36.7 C)  TempSrc: Axillary   Oral  SpO2: 98% 97% 97%   Weight:      Height:        Intake/Output Summary (Last 24 hours) at 10/28/2020 1108 Last data filed at 10/28/2020 0930 Gross per 24 hour  Intake 2671.67 ml  Output 3150 ml  Net -478.33 ml   Filed Weights   10/26/20 0500 10/27/20 0500  Weight: 134.3 kg (!) 136.5 kg    Examination:  General exam: NAD.  Deformed head secondary to history of craniotomy Respiratory system: Coarse breath sounds anterior lung fields.  Some rhonchi noted.  No wheezing.  No use of accessory muscles of respiration.  Cardiovascular system: RRR no murmurs rubs or gallops.  No JVD.  Right lower extremity larger than left lower extremity.  No significant pitting edema.  Gastrointestinal system: Abdomen is soft, nontender, nondistended, positive bowel sounds.  No rebound.  No guarding.  Central nervous system: Alert and oriented.  Dysarthric speech.  No focal neurological deficits. Extremities: Right lower extremity larger than left lower extremity.  Skin: No rashes, lesions or ulcers Psychiatry: Judgement and insight appear fair. Mood & affect appropriate.     Data Reviewed: I have personally reviewed following labs and imaging studies  CBC: Recent Labs  Lab 10/24/20 1652 10/25/20 0525 10/26/20 0850 10/27/20 0346 10/28/20 0307  WBC 14.5* 14.7* 16.2* 15.4* 20.5*  NEUTROABS 12.8*  --  14.7* 13.5* 18.6*  HGB 13.1 13.5 12.3* 11.8* 10.9*  HCT 42.8 45.4 39.6 38.7* 35.1*  MCV 96.0 98.7 95.2 97.0 94.6  PLT 210 201 180 163 801    Basic Metabolic Panel: Recent Labs  Lab 10/24/20 1652 10/25/20 0525 10/26/20 0850 10/27/20 0346 10/28/20 0307  NA 144 147* 152* 143 141  K 4.3 4.8 4.6 3.9 3.8  CL 104 106 111 110 107  CO2 _1 GLUCOSE 139* 107* 128* 131* 168*  BUN 54* 60* 73* 61* 55*  CREATININE 2.55* 2.57* 3.55* 2.26* 1.73*  CALCIUM 8.7* 8.0* 7.9* 8.0* 8.3*  MG  --   --  3.0*  --   --   PHOS  --   --  5.3*  --   --    COVID-19 Labs  Recent Labs    10/26/20 0850 10/26/20 0851 10/27/20 0346 10/28/20 0307  DDIMER 7.46*  --  7.65* 6.20*  FERRITIN  --  72 86 105  CRP  --  31.0* 28.3* 19.9*    Lab Results  Component Value Date   SARSCOV2NAA POSITIVE (A) 10/24/2020   GFR: Estimated Creatinine Clearance: 61.7 mL/min (A) (by C-G formula based on SCr of 1.73 mg/dL (H)).  Liver Function Tests: Recent Labs  Lab 10/24/20 1652 10/25/20 0525 10/27/20 0346 10/28/20 0307  AST 33 39 75* 68*  ALT 17 19 38 38  ALKPHOS 139* 108 74 82  BILITOT 1.2 1.4* 0.6 0.4  PROT 8.3* 7.5 6.7 6.7  ALBUMIN 3.5 3.3* 2.7* 2.7*    CBG: No results for input(s): GLUCAP in the last 168 hours.   Recent Results (from the past 240 hour(s))  Resp Panel by RT-PCR (Flu A&B, Covid) Nasopharyngeal Swab     Status: Abnormal   Collection Time: 10/24/20  4:52 PM   Specimen: Nasopharyngeal Swab; Nasopharyngeal(NP) swabs in vial transport medium  Result Value Ref Range Status   SARS Coronavirus 2 by RT PCR POSITIVE (A) NEGATIVE Final    Comment: RESULT CALLED TO, READ BACK BY AND VERIFIED WITH: GREEN F. 01.03.22 @ 2046 BY MECIAL J. (NOTE) SARS-CoV-2 target nucleic acids are DETECTED.  The SARS-CoV-2 RNA is generally detectable in upper respiratory specimens during the acute phase of infection. Positive results are indicative of the presence of the identified virus, but do not rule out bacterial infection or co-infection with other pathogens not detected by the test. Clinical correlation with patient history and other diagnostic information is necessary to determine patient infection status. The expected result is Negative.  Fact Sheet for Patients: EntrepreneurPulse.com.au  Fact Sheet for Healthcare  Providers: IncredibleEmployment.be  This test is not yet approved or cleared by the [place] FDA and  has been authorized for detection and/or diagnosis of SARS-CoV-2 by FDA under an Emergency Use Authorization (EUA).  This EUA will remain in effect (meaning this test ca n be used) for the duration of  the COVID-19 declaration under Section 564(b)(1) of the Act, 21 U.S.C. section 360bbb-3(b)(1), unless the authorization is terminated or revoked sooner.     Influenza A by PCR NEGATIVE NEGATIVE Final   Influenza B by PCR NEGATIVE NEGATIVE Final    Comment: (NOTE) The Xpert Xpress SARS-CoV-2/FLU/RSV plus assay is intended as an aid in the diagnosis of influenza from Nasopharyngeal swab specimens and should not be used as a sole basis for treatment. Nasal washings and aspirates are unacceptable for Xpert Xpress SARS-CoV-2/FLU/RSV testing.  Fact Sheet for Patients: EntrepreneurPulse.com.au  Fact Sheet for Healthcare Providers: IncredibleEmployment.be  This test is not yet approved or cleared by the [place] FDA and has been authorized for detection and/or diagnosis of SARS-CoV-2 by FDA under an Emergency Use Authorization (EUA). This EUA will remain in effect (meaning this test can be used) for the duration of the COVID-19 declaration under Section 564(b)(1) of the Act, 21 U.S.C. section 360bbb-3(b)(1), unless the authorization is terminated or revoked.  Performed at [facility], [place] [address] [place], [place] [address]   C Difficile Quick Screen w PCR reflex     Status: None   Collection Time: 10/24/20 10:09 PM   Specimen: STOOL  Result Value Ref Range Status   C Diff antigen NEGATIVE NEGATIVE Final   C Diff toxin NEGATIVE NEGATIVE Final   C Diff interpretation No C. difficile detected.  Final    Comment: Performed at [facility], [place] [address], [place],  [address]   Gastrointestinal Panel  by PCR , Stool     Status: None   Collection Time: 10/24/20 10:09 PM   Specimen: Stool  Result Value Ref Range Status   Campylobacter species NOT DETECTED NOT DETECTED Final   Plesimonas shigelloides NOT DETECTED NOT DETECTED Final   Salmonella species NOT DETECTED NOT DETECTED Final   Yersinia enterocolitica NOT DETECTED NOT DETECTED Final   Vibrio species NOT DETECTED NOT DETECTED Final   Vibrio cholerae NOT DETECTED NOT DETECTED Final   Enteroaggregative E coli (EAEC) NOT DETECTED NOT DETECTED Final   Enteropathogenic E coli (EPEC) NOT DETECTED NOT DETECTED Final   Enterotoxigenic E coli (ETEC) NOT DETECTED NOT DETECTED Final   Shiga like toxin producing E coli (STEC) NOT DETECTED NOT DETECTED Final   Shigella/Enteroinvasive E coli (EIEC) NOT DETECTED NOT DETECTED Final   Cryptosporidium NOT DETECTED NOT DETECTED Final   Cyclospora cayetanensis NOT DETECTED NOT DETECTED Final   Entamoeba histolytica NOT DETECTED NOT DETECTED Final   Giardia lamblia NOT DETECTED NOT DETECTED Final   Adenovirus F40/41 NOT DETECTED NOT DETECTED Final   Astrovirus NOT DETECTED NOT DETECTED Final   Norovirus GI/GII NOT DETECTED NOT DETECTED Final   Rotavirus A NOT DETECTED NOT DETECTED Final   Sapovirus (I, II, IV, and V) NOT DETECTED NOT DETECTED Final    Comment: Performed at [facility], [place] [place], [place] [address]  Blood Culture (routine x 2)     Status: None (Preliminary result)   Collection Time: 10/24/20 10:30 PM   Specimen: BLOOD  Result Value Ref Range Status   Specimen Description   Final    BLOOD RIGHT ANTECUBITAL Performed at [facility], [place] [address], [place], [place] [address]    Special Requests   Final    BOTTLES DRAWN AEROBIC AND ANAEROBIC Blood Culture adequate volume Performed at [place] [address], [place], [place] [address]    Culture   Final    NO GROWTH 2 DAYS Performed at  [place] [address], [place], [place] [address]    Report Status PENDING  Incomplete  Blood Culture (routine x 2)     Status: None (Preliminary result)   Collection Time: 10/24/20 10:30 PM   Specimen: BLOOD  Result Value Ref Range Status   Specimen Description   Final    BLOOD BLOOD LEFT FOREARM Performed at [place] [address], [place], [place] [address]    Special Requests   Final    BOTTLES DRAWN AEROBIC AND ANAEROBIC Blood Culture results may not be optimal due to an inadequate volume of blood received in culture bottles Performed at [place] [address] [place], [place] [address]    Culture   Final    NO GROWTH 2 DAYS Performed at [place] [address], [place], [place] [address]    Report Status PENDING  Incomplete  MRSA PCR Screening     Status: None   Collection Time: 10/25/20  6:04 PM   Specimen: Nasal Mucosa; Nasopharyngeal  Result Value Ref Range Status   MRSA by PCR NEGATIVE NEGATIVE Final    Comment:        The GeneXpert MRSA Assay (FDA approved for NASAL specimens only), is one component of a comprehensive MRSA colonization surveillance program. It is not intended to diagnose MRSA infection nor to guide or monitor treatment for MRSA infections. Performed at [facility], [place] [address]  [place], [place] [address]   Urine culture     Status: None   Collection Time: 10/26/20 12:46 AM   Specimen: Urine, Random  Result Value Ref Range Status   Specimen Description   Final    URINE, RANDOM Performed at [place] [address], [place], [place] [address]    Special Requests   Final    NONE Performed at [facility], [place] [address] [place], [place] [address]    Culture   Final    NO GROWTH Performed at [facility], [place] [address], [place], [place] [address]    Report Status 10/27/2020 FINAL  Final          Radiology Studies: No results found.      Scheduled Meds: . (feeding supplement) PROSource Plus  30 mL Oral BID BM  . amiodarone  200 mg Oral Daily  . aspirin EC  81 mg Oral Daily  . bethanechol  10 mg Oral TID  . chlorhexidine  15 mL Mouth Rinse BID  . Chlorhexidine Gluconate Cloth  6 each Topical Daily  . cloBAZam  40 mg Oral Daily  . clopidogrel  75 mg Oral Daily  . digoxin  0.125 mg Intravenous Daily  . feeding supplement  1 Container Oral BID BM  . finasteride  5 mg Oral Daily  . gabapentin  300 mg Oral QHS  . guaiFENesin  400 mg Oral BID  . Ipratropium-Albuterol  2 puff Inhalation TID  . lamoTRIgine  400 mg Oral BID  . levothyroxine  56 mcg Intravenous Q0600  . loratadine  10 mg Oral Daily  . mouth rinse  15 mL Mouth Rinse q12n4p  . methylPREDNISolone (SOLU-MEDROL) injection  81.25 mg Intravenous BID  . montelukast  10 mg Oral QHS  . multivitamin with minerals  1 tablet Oral Daily  . sodium chloride flush  3 mL Intravenous Q12H  . tamsulosin  0.8 mg Oral QPC supper   Continuous Infusions: . dextrose Stopped (10/28/20 0455)  . levETIRAcetam Stopped (10/27/20 1253)  . levETIRAcetam Stopped (10/27/20 2130)  . piperacillin-tazobactam (ZOSYN)  IV 12.5 mL/hr at 10/28/20 0948  . remdesivir 100 mg in NS 100 mL Stopped (10/27/20 1045)     LOS: 4 days    Time spent: 40 minutes    Irine Seal, MD [facility]   To contact the attending provider between 7A-7P or the covering provider during after hours 7P-7A, please log into the web site [email or web address] and access using universal [place] password for that web site. If you do not have the password, please call the hospital operator.  10/28/2020, 11:08 AM

## 2020-10-28 NOTE — Plan of Care (Signed)

## 2020-10-28 NOTE — Evaluation (Signed)
Occupational Therapy Evaluation Patient Details Name: Austin Hartman MRN: [id number] DOB: 30-Mar-1951 Today's Date: 10/28/2020    History of Present Illness Austin Hartman is a 70 y.o. male with PMH significant for TBI, CVA, chronic SDH, s/p craniectomy, epilepsy, and recent covid-19 diagnosis who lives in a long-term care facility.   Clinical Impression   [name] is a 71 year old man who presents with right sided hemiparesis, generalized weakness, edema in RLE, and presumed impaired balance resulting in a decline in baseline abilities. Per chart patient able to transfer to wc and maneuver wc in facility, assist with transfers and ADLs. On evaluation - which was limited to bed eval due to malfunctioning rectal tube - patient max assist for rolling left and right, maintaining side lying position, total care for pericare care, lower body dressing and bathing and mod - max assist for UB ADLs. Patient will benefit from skilled OT services while in hospital to improve deficits and improve functional abilities in order to reduce caregiver burden. Recommend return to LTC at discharge.      Follow Up Recommendations  SNF    Equipment Recommendations  None recommended by OT    Recommendations for Other Services       Precautions / Restrictions Precautions Precautions: Fall Precaution Comments: seizure, craniotomy (needs helmet) Restrictions Weight Bearing Restrictions: No      Mobility Bed Mobility Overal bed mobility: Needs Assistance Bed Mobility: Rolling Rolling: +2 for safety/equipment;+2 for physical assistance;Max assist              Transfers                      Balance                                           ADL either performed or assessed with clinical judgement   ADL Overall ADL's : Needs assistance/impaired Eating/Feeding: Set up   Grooming: Set up   Upper Body Bathing: Set up;Maximal assistance;Bed level   Lower Body  Bathing: Total assistance;Bed level   Upper Body Dressing : Maximal assistance;Bed level   Lower Body Dressing: Total assistance;+2 for physical assistance;+2 for safety/equipment;Bed level     Toilet Transfer Details (indicate cue type and reason): unable Toileting- Clothing Manipulation and Hygiene: Total assistance;+2 for physical assistance;+2 for safety/equipment;Bed level               Vision Patient Visual Report: No change from baseline       Perception     Praxis      Pertinent Vitals/Pain Pain Assessment: Faces Faces Pain Scale: No hurt     Hand Dominance     Extremity/Trunk Assessment Upper Extremity Assessment Upper Extremity Assessment: RUE deficits/detail;LUE deficits/detail RUE Deficits / Details: Able grossly raise right arm 90 degrees shoulder flexioin, gross functional elbow ROM, wrist exhibits minimal active extension but can be passively ranged to neutral. wrist maintained full flexed position with fingers curled. Patient able to open and close fingers. LUE Deficits / Details: Shoulder 4-/5 strength, 4/5 elbow strength, 5/5 wrist strength, 4/5 grip LUE Sensation: WNL LUE Coordination: WNL   Lower Extremity Assessment Lower Extremity Assessment: Defer to PT evaluation       Communication Communication Communication: Expressive difficulties   Cognition Arousal/Alertness: Awake/alert Behavior During Therapy: WFL for tasks assessed/performed Overall Cognitive Status: Difficult to assess  General Comments: Able to follow commands   General Comments       Exercises     Shoulder Instructions      Home Living Family/patient expects to be discharged to:: Skilled nursing facility                                        Prior Functioning/Environment Level of Independence: Needs assistance  Gait / Transfers Assistance Needed: It is reported that patient can typically transfer to wc  and manuever wc around facility. ADL's / Homemaking Assistance Needed: needs assistance            OT Problem List: Decreased strength;Decreased range of motion;Decreased activity tolerance;Impaired balance (sitting and/or standing);Decreased coordination;Decreased safety awareness;Decreased knowledge of use of DME or AE;Decreased knowledge of precautions;Obesity;Increased edema;Impaired tone      OT Treatment/Interventions: Self-care/ADL training;Therapeutic exercise;Neuromuscular education;DME and/or AE instruction;Therapeutic activities;Balance training;Patient/family education    OT Goals(Current goals can be found in the care plan section) Acute Rehab OT Goals Patient Stated Goal: out of bed OT Goal Formulation: With patient Time For Goal Achievement: 11/11/20 Potential to Achieve Goals: Fair  OT Frequency: Min 2X/week   Barriers to D/C:            Co-evaluation PT/OT/SLP Co-Evaluation/Treatment: Yes   PT goals addressed during session: Mobility/safety with mobility;Strengthening/ROM OT goals addressed during session: ADL's and self-care;Strengthening/ROM      AM-PAC OT "6 Clicks" Daily Activity     Outcome Measure Help from another person eating meals?: A Little Help from another person taking care of personal grooming?: A Little Help from another person toileting, which includes using toliet, bedpan, or urinal?: Total Help from another person bathing (including washing, rinsing, drying)?: A Lot Help from another person to put on and taking off regular upper body clothing?: A Lot Help from another person to put on and taking off regular lower body clothing?: Total 6 Click Score: 12   End of Session Nurse Communication: Mobility status;Need for lift equipment  Activity Tolerance: Patient tolerated treatment well Patient left: in bed;with call bell/phone within reach;with nursing/sitter in room  OT Visit Diagnosis: Muscle weakness (generalized) (M62.81);Hemiplegia  and hemiparesis Hemiplegia - Right/Left: Right Hemiplegia - dominant/non-dominant: Dominant Hemiplegia - caused by: Cerebral infarction                Time: 1540-1605 OT Time Calculation (min): 25 min Charges:  OT General Charges $OT Visit: 1 Visit OT Evaluation $OT Eval Moderate Complexity: 1 Mod  Adarryl Goldammer, OTR/L Acute Care Rehab Services  Office [phone] Pager: [phone]   Kelli Churn 10/28/2020, 4:25 PM

## 2020-10-28 NOTE — Evaluation (Signed)
Physical Therapy Evaluation Patient Details Name: Austin Hartman MRN: [id number] DOB: 03/05/1951 Today's Date: 10/28/2020   History of Present Illness  Austin Hartman is a 70 y.o. male with PMH significant for TBI, CVA, chronic SDH, s/p craniectomy, epilepsy, and recent covid-19 diagnosis who lives in a long-term care facility.  Clinical Impression  The patient  Able to follow directions  And assist with rolling to each side. Right LE very red around the knee and thigh and entire leg has edema.(negative for DVT).  patient comes from SNF/LTC and will return when able.  Pt admitted with above diagnosis.  Pt currently with functional limitations due to the deficits listed below (see PT Problem List). Pt will benefit from skilled PT to increase their independence and safety with mobility to allow discharge to the venue listed below.    By report, patient was able to transfer and mobilize in his [facility] at [facility].    Follow Up Recommendations SNF (return to LTC.)    Equipment Recommendations  None recommended by PT    Recommendations for Other Services       Precautions / Restrictions Precautions Precautions: Fall Precaution Comments: seizure, craniotomy (needs helmet) Restrictions Weight Bearing Restrictions: No      Mobility  Bed Mobility Overal bed mobility: Needs Assistance Bed Mobility: Rolling Rolling: +2 for safety/equipment;+2 for physical assistance;Max assist         General bed mobility comments: patient does reach for rails when rolling to both sides. Has more difficulty gripping with right hand onto rail    Transfers                 General transfer comment: TBA  Ambulation/Gait                Stairs            Wheelchair Mobility    Modified Rankin (Stroke Patients Only)       Balance                                             Pertinent Vitals/Pain Pain Assessment: Faces Faces Pain Scale: No hurt Pain Location:  groans when being cleaned up from BM Pain Descriptors / Indicators: Discomfort;Grimacing Pain Intervention(s): Monitored during session    Home Living Family/patient expects to be discharged to:: Skilled nursing facility                      Prior Function Level of Independence: Needs assistance   Gait / Transfers Assistance Needed: It is reported that patient can typically transfer to wc and manuever wc around facility.  ADL's / Homemaking Assistance Needed: needs assistance        Hand Dominance        Extremity/Trunk Assessment   Upper Extremity Assessment Upper Extremity Assessment: RUE deficits/detail;LUE deficits/detail RUE Deficits / Details: Able grossly raise right arm 90 degrees shoulder flexioin, gross functional elbow ROM, wrist exhibits minimal active extension but can be passively ranged to neutral. wrist maintained full flexed position with fingers curled. Patient able to open and close fingers. LUE Deficits / Details: Shoulder 4-/5 strength, 4/5 elbow strength, 5/5 wrist strength, 4/5 grip LUE Sensation: WNL LUE Coordination: WNL    Lower Extremity Assessment Lower Extremity Assessment: RLE deficits/detail;LLE deficits/detail RLE Deficits / Details: severe edema of the leg, reddened at knee and thigh, knee  contracted at ~ 60* , grossly lifted leg from bed a few inches.    Cervical / Trunk Assessment Cervical / Trunk Assessment:  (tends to lateral flex to right_to hemiside)  Communication   Communication: Expressive difficulties  Cognition Arousal/Alertness: Awake/alert Behavior During Therapy: WFL for tasks assessed/performed Overall Cognitive Status: Difficult to assess                                 General Comments: Able to follow commands      General Comments General comments (skin integrity, edema, etc.): TBA    Exercises     Assessment/Plan    PT Assessment Patient needs continued PT services  PT Problem List  Decreased strength;Decreased balance;Decreased knowledge of precautions       PT Treatment Interventions Functional mobility training;Therapeutic activities;Therapeutic exercise;Patient/family education    PT Goals (Current goals can be found in the Care Plan section)  Acute Rehab PT Goals Patient Stated Goal: out of bed PT Goal Formulation: Patient unable to participate in goal setting Time For Goal Achievement: 11/11/20 Potential to Achieve Goals: Fair    Frequency Min 2X/week   Barriers to discharge        Co-evaluation PT/OT/SLP Co-Evaluation/Treatment: Yes Reason for Co-Treatment: For patient/therapist safety;Complexity of the patient's impairments (multi-system involvement);To address functional/ADL transfers PT goals addressed during session: Mobility/safety with mobility OT goals addressed during session: ADL's and self-care       AM-PAC PT "6 Clicks" Mobility  Outcome Measure Help needed turning from your back to your side while in a flat bed without using bedrails?: Total Help needed moving from lying on your back to sitting on the side of a flat bed without using bedrails?: Total Help needed moving to and from a bed to a chair (including a wheelchair)?: Total Help needed standing up from a chair using your arms (e.g., wheelchair or bedside chair)?: Total Help needed to walk in hospital room?: Total Help needed climbing 3-5 steps with a railing? : Total 6 Click Score: 6    End of Session   Activity Tolerance: Patient tolerated treatment well Patient left: in bed;with nursing/sitter in room Nurse Communication: Mobility status;Need for lift equipment PT Visit Diagnosis: Other symptoms and signs involving the nervous system (R29.898)    Time: 6010-9323 PT Time Calculation (min) (ACUTE ONLY): 32 min   Charges:   PT Evaluation $PT Eval Low Complexity: 1 Low          Blanchard Kelch PT Acute Rehabilitation Services Pager [phone] Office  [phone]   Rada Hay 10/28/2020, 4:49 PM

## 2020-10-28 NOTE — Progress Notes (Signed)
Daily Progress Note   Patient Name: Austin Hartman       Date: 10/28/2020 DOB: 19-Jun-1951  Age: 70 y.o. MRN#: [id number] Attending Physician: Rodolph Bong, MD Primary Care Physician: Galvin Proffer, MD Admit Date: 10/24/2020  Reason for Consultation/Follow-up: Establishing goals of care  Subjective: Resting comfortably in bed, no distress noted, undergoing mid line peripheral IV placement.   Length of Stay: 4  Current Medications: Scheduled Meds:  . (feeding supplement) PROSource Plus  30 mL Oral BID BM  . amiodarone  200 mg Oral Daily  . aspirin EC  81 mg Oral Daily  . bethanechol  10 mg Oral TID  . chlorhexidine  15 mL Mouth Rinse BID  . Chlorhexidine Gluconate Cloth  6 each Topical Daily  . cloBAZam  40 mg Oral Daily  . clopidogrel  75 mg Oral Daily  . digoxin  0.125 mg Intravenous Daily  . feeding supplement  1 Container Oral BID BM  . finasteride  5 mg Oral Daily  . gabapentin  300 mg Oral QHS  . guaiFENesin  400 mg Oral BID  . Ipratropium-Albuterol  2 puff Inhalation TID  . lamoTRIgine  400 mg Oral BID  . levothyroxine  56 mcg Intravenous Q0600  . loratadine  10 mg Oral Daily  . mouth rinse  15 mL Mouth Rinse q12n4p  . methylPREDNISolone (SOLU-MEDROL) injection  81.25 mg Intravenous BID  . montelukast  10 mg Oral QHS  . multivitamin with minerals  1 tablet Oral Daily  . sodium chloride flush  10-40 mL Intracatheter Q12H  . sodium chloride flush  3 mL Intravenous Q12H  . tamsulosin  0.8 mg Oral QPC supper    Continuous Infusions: . dextrose 100 mL/hr at 10/28/20 1245  . levETIRAcetam 1,250 mg (10/28/20 1256)  . levETIRAcetam Stopped (10/27/20 2130)  . piperacillin-tazobactam (ZOSYN)  IV 12.5 mL/hr at 10/28/20 0948  . remdesivir 100 mg in NS 100 mL Stopped  (10/27/20 1045)    PRN Meds: albuterol, chlorpheniramine-HYDROcodone, guaiFENesin-dextromethorphan, sodium chloride flush  Physical Exam         The above conversation was completed via telephone due to the visitor restrictions during the COVID-19 pandemic. Thorough chart review and discussion with necessary members of the care team was completed as part of assessment. All issues were discussed and addressed  but no physical exam was performed.patient noted to be undergoing procedure at time of my visit. Observed to be in no distress, regular work of breathing.   Vital Signs: BP 109/70   Pulse 91   Temp 98 F (36.7 C) (Axillary)   Resp 14   Ht 6\' 4"  (1.93 m)   Wt (!) 136.5 kg   SpO2 97%   BMI 36.63 kg/m  SpO2: SpO2: 97 % O2 Device: O2 Device: Nasal Cannula O2 Flow Rate: O2 Flow Rate (L/min): 3 L/min  Intake/output summary:   Intake/Output Summary (Last 24 hours) at 10/28/2020 1314 Last data filed at 10/28/2020 0930 Gross per 24 hour  Intake 1934.48 ml  Output 2350 ml  Net -415.52 ml   LBM: Last BM Date: 10/27/20 Baseline Weight: Weight: 134.3 kg Most recent weight: Weight: (!) 136.5 kg       Palliative Assessment/Data:      Patient Active Problem List   Diagnosis Date Noted  . Pressure injury of skin 10/26/2020  . Abdominal distention   . Acute respiratory failure with hypoxia (HCC)   . ARF (acute renal failure) (HCC)   . COVID-19   . Hypernatremia   . Severe sepsis (HCC) 10/24/2020  . On amiodarone therapy 01/21/2018  . Chronic atrial fibrillation (HCC) 06/06/2017  . High risk medication use 06/06/2017  . Subdural hematoma, chronic (HCC) 06/06/2017  . 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 Typical atrial flutter (HCC) 05/07/2016  . Chronic constipation 03/29/2016  . Altered mental state 03/29/2016  . Hypothermia 03/29/2016  .  Epilepsy (HCC) 03/29/2016  . Abdominal pain, generalized 03/29/2016  . Hypotension, unspecified 03/29/2016  . Left hemiparesis (HCC) 03/29/2016  . At high risk for aspiration 03/29/2016  . DNR (do not resuscitate) 03/29/2016  . Leukocytosis 03/29/2016  . TBI (traumatic brain injury) (HCC)   . Dysphagia   . 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 Diarrhea   . Hypertensive heart disease with heart failure (HCC)   . History of traumatic brain injury   . Sepsis (HCC)   . Hyperlipidemia 06/30/2015  . Hemiplegia (HCC) 12/13/2011  . Partial epilepsy with impairment of consciousness, intractable (HCC) 12/13/2011    Palliative Care Assessment & Plan   Patient Profile:  Austin Hartman a 70 y.o.malewith PMH significant for TBI, CVA, chronic SDH, s/p craniectomy, epilepsy, and recent covid-19 diagnosiswho lives in a long-term care facility.  Assessment: Severe sepsis Acute renal failure Acute metabolic encephalopathy Breakthrough COVID-19 infection/acute hypoxic respiratory failure  Recommendations/Plan:  Renal function continues to improve, patient undergoing IV midline placement.  SLP evaluation also noted.  Monitor p.o. intake.  Recommend discharge back to facility with palliative support based on patient's current hospital course, this was discussed with sister on the phone at the time of initial consultation.  Code Status:    Code Status Orders  (From admission, onward)         Start     Ordered   10/25/20 0325  Do not attempt resuscitation (DNR)  Continuous       Question Answer Comment  In the event of cardiac or respiratory ARREST Do not call a "code blue"   In the event of cardiac or respiratory ARREST Do not perform Intubation, CPR, defibrillation or ACLS   In the event of cardiac or respiratory ARREST Use medication by any route, position, wound care,  and other measures to relive pain and suffering.  May use oxygen, suction and manual treatment of airway obstruction as needed for comfort.      10/25/20 0324        Code Status History    Date Active Date Inactive Code Status Order ID Comments User Context   10/24/2020 1744 10/25/2020 0325 DNR [id number]  Lucrezia Starch, MD ED   03/29/2016 2100 04/03/2016 2025 DNR [id number]  Murlean Iba, MD Inpatient   03/29/2016 1732 03/29/2016 2100 DNR [id number]  Raylene Miyamoto, MD ED   Advance Care Planning Activity    Advance Directive Documentation   Flowsheet Row Most Recent Value  Type of Advance Directive Healthcare Power of Attorney  Pre-existing out of facility DNR order (yellow form or pink MOST form) -  "MOST" Form in Place? -       Prognosis:   Unable to determine  Discharge Planning:  back to his facility with palliative support.   Care plan was discussed with  IDT  Thank you for allowing the Palliative Medicine Team to assist in the care of this patient.   Time In: 12 Time Out: 12.15 Total Time 15 Prolonged Time Billed No       Greater than 50%  of this time was spent counseling and coordinating care related to the above assessment and plan.  Loistine Chance, MD  Please contact Palliative Medicine Team phone at [phone] for questions and concerns.

## 2020-10-28 NOTE — Progress Notes (Signed)
Pharmacy Antibiotic Note  Austin Hartman is a 70 y.o. male with COVID-19 currently on zosyn day #4 for sepsis. Scr trending down with 1.73 today (crcl~62). All cultures have been negative thus far.   Plan: - continue zosyn 3.375 gm IV q8h (infuse over 4 hrs) - f/u LOT  ______________________________________________  Height: 6\' 4"  (193 cm) Weight: (!) 136.5 kg (300 lb 14.9 oz) IBW/kg (Calculated) : 86.8  Temp (24hrs), Avg:98 F (36.7 C), Min:96.9 F (36.1 C), Max:98.5 F (36.9 C)  Recent Labs  Lab 10/24/20 1652 10/25/20 0525 10/25/20 1416 10/26/20 0850 10/26/20 0851 10/27/20 0346 10/28/20 0307  WBC 14.5* 14.7*  --  16.2*  --  15.4* 20.5*  CREATININE 2.55* 2.57*  --  3.55*  --  2.26* 1.73*  LATICACIDVEN 4.5*  4.8*  --  4.2*  --  2.3*  --   --     Estimated Creatinine Clearance: 61.7 mL/min (A) (by C-G formula based on SCr of 1.73 mg/dL (H)).    Allergies  Allergen Reactions  . Acetaminophen Other (See Comments)    unknown  . Azithromycin Other (See Comments)    unknown  . Biaxin [Clarithromycin] Other (See Comments)    unknown  . Diltiazem Other (See Comments)    unknown  . Other Other (See Comments)    Darvocet-N - unknown  . Verapamil Other (See Comments)    unknown     Thank you for allowing pharmacy to be a part of this patient's care.  12/26/20 10/28/2020 1:44 PM

## 2020-10-29 DIAGNOSIS — J9601 Acute respiratory failure with hypoxia: Secondary | ICD-10-CM | POA: Diagnosis not present

## 2020-10-29 DIAGNOSIS — U071 COVID-19: Secondary | ICD-10-CM | POA: Diagnosis not present

## 2020-10-29 DIAGNOSIS — A419 Sepsis, unspecified organism: Secondary | ICD-10-CM | POA: Diagnosis not present

## 2020-10-29 DIAGNOSIS — R140 Abdominal distension (gaseous): Secondary | ICD-10-CM | POA: Diagnosis not present

## 2020-10-29 LAB — COMPREHENSIVE METABOLIC PANEL
ALT: 31 U/L (ref 0–44)
AST: 49 U/L — ABNORMAL HIGH (ref 15–41)
Albumin: 2.3 g/dL — ABNORMAL LOW (ref 3.5–5.0)
Alkaline Phosphatase: 79 U/L (ref 38–126)
Anion gap: 8 (ref 5–15)
BUN: 46 mg/dL — ABNORMAL HIGH (ref 8–23)
CO2: 27 mmol/L (ref 22–32)
Calcium: 8.3 mg/dL — ABNORMAL LOW (ref 8.9–10.3)
Chloride: 108 mmol/L (ref 98–111)
Creatinine, Ser: 1.4 mg/dL — ABNORMAL HIGH (ref 0.61–1.24)
GFR, Estimated: 55 mL/min — ABNORMAL LOW (ref >60)
Glucose, Bld: 162 mg/dL — ABNORMAL HIGH (ref 70–99)
Potassium: 3.8 mmol/L (ref 3.5–5.1)
Sodium: 143 mmol/L (ref 135–145)
Total Bilirubin: 0.5 mg/dL (ref 0.3–1.2)
Total Protein: 6 g/dL — ABNORMAL LOW (ref 6.5–8.1)

## 2020-10-29 LAB — CBC
HCT: 33.7 % — ABNORMAL LOW (ref 39.0–52.0)
Hemoglobin: 10.6 g/dL — ABNORMAL LOW (ref 13.0–17.0)
MCH: 29.8 pg (ref 26.0–34.0)
MCHC: 31.5 g/dL (ref 30.0–36.0)
MCV: 94.7 fL (ref 80.0–100.0)
Platelets: 181 10*3/uL (ref 150–400)
RBC: 3.56 MIL/uL — ABNORMAL LOW (ref 4.22–5.81)
RDW: 15.2 % (ref 11.5–15.5)
WBC: 14.1 10*3/uL — ABNORMAL HIGH (ref 4.0–10.5)
nRBC: 0 % (ref 0.0–0.2)

## 2020-10-29 LAB — C-REACTIVE PROTEIN: CRP: 10.2 mg/dL — ABNORMAL HIGH (ref <1.0)

## 2020-10-29 LAB — FERRITIN: Ferritin: 80 ng/mL (ref 24–336)

## 2020-10-29 LAB — D-DIMER, QUANTITATIVE: D-Dimer, Quant: 3.95 ug/mL-FEU — ABNORMAL HIGH (ref 0.00–0.50)

## 2020-10-29 LAB — PROCALCITONIN: Procalcitonin: 1.38 ng/mL

## 2020-10-29 NOTE — Progress Notes (Addendum)
PROGRESS NOTE    Austin Hartman  MBT:[id number] DOB: 12-Aug-1951 DOA: 10/24/2020 PCP: Bonnita Nasuti, MD    Chief Complaint  Patient presents with  . Covid Positive    10/20/20    Brief Narrative:  Austin Hartman is a 70 y.o. male with PMH significant for TBI, CVA, chronic SDH, s/p craniectomy, epilepsy, and recent covid-19 diagnosis who lives in a long-term care facility.  At baseline, patient is able to transfer himself from bed to wheelchair and rolled around his wheelchair.  At baseline he is able to have a meaningful conversation. Patient was sent to the ED from [facility] on [facility] for altered mental status, hypothermia, cough, hypoxia to 70s requiring 10 L of oxygen.  In the ED, patient was hypothermic to 94, hypotensive down to 70s and was altered. Labs showed WBC count elevated to 14.5, creatinine elevated to 2.55, lactic acid elevated to 4.8 He had copious diarrhea with negative C. Difficile. EDP discussed poor prognosis with patient's sister who is HCPOA. Visitation was offered, confirmed DNR and desire to avoid invasive procedures including central lines and ventilator. IV fluids, antibiotics were given with slight improvement in blood pressure.  Pt was admitted under hospitalist service Currently care was consulted. Of note patient tested positive for Covid on 12/30 despite history of vaccination x3 latest in December   Assessment & Plan:   Principal Problem:   Severe sepsis [facility]) Active Problems:   Dysphagia   Coronary artery disease involving native coronary artery of native heart with angina pectoris (HCC)   Diarrhea   Hypertensive heart disease with heart failure (HCC)   Chronic constipation   Hypothermia   Epilepsy (HCC)   Hypotension, unspecified   At high risk for aspiration   DNR (do not resuscitate)   Leukocytosis   Hyperlipidemia   Chronic atrial fibrillation (HCC)   Subdural hematoma, chronic (HCC)   Pressure injury of skin   Abdominal  distention   Acute respiratory failure with hypoxia (HCC)   ARF (acute renal failure) (HCC)   COVID-19   Hypernatremia  1 severe sepsis with hypotension Patient met criteria for sepsis with an hypothermia, hypotension, altered mental status.  Lab work was consistent with a leukocytosis, lactic acidosis, elevated procalcitonin.  C. difficile PCR negative.  GI pathogen panel negative.  Urinalysis nitrite negative leukocytes negative.  Blood cultures pending with no growth to date.  Chest x-ray with persistent bilateral atelectasis.  Persistent interstitial prominence left lung.  Small right pleural effusion cannot be excluded.  Blood pressure improving.  Patient also with mild aspiration risk and could likely have aspirated.  Patient initially empirically on IV vancomycin and Zosyn.  Due to worsening renal function vancomycin discontinued.  Patient on IV Zosyn which we will continue for now.  Leukocytosis trending down.  Procalcitonin trending down.  If continued improvement could likely transition to oral antibiotics in the next 1 to 2 days..  Patient seen by speech therapy with mild aspiration risk and currently on dysphagia 3 diet. Renal function improving with IV fluids of D5W.  Supportive care.  Follow.    2.  Abdominal distention/watery diarrhea C. difficile PCR negative.  GI pathogen panel negative.  Patient with ongoing diarrhea per RN.  Diarrhea likely secondary to COVID-19.  Imodium 4 mg p.o. x1 given 10/28/2020.  Continue Imodium as needed.  Follow.  3.  Acute renal failure Likely secondary to a prerenal azotemia as patient had presented with severe sepsis and hypotension in the setting of  diuretics of Demadex.  Last creatinine noted on epic in 2017 was 0.84.  Creatinine currently at 1.40 from 1.73 from 2.26 from 3.55.  Patient was n.p.o. early on during the hospitalization, seen by speech therapy and currently on a dysphagia 3 diet.  Renal ultrasound negative for hydronephrosis.  Patient also  noted to have diarrhea per RN.  Decrease IV fluids of D5W to 50cc/hr.  Strict I's and O's.  Daily weights.  Follow.  4.Hypernatremia Likely secondary to volume depletion.  LR discontinued.  Patient placed on D5W with improvement in sodium currently at 143.  Decrease IV fluids to D5W at 50 cc an hour.  Follow.   5.  Breakthrough COVID-19 infection/acute respiratory failure with hypoxia Patient sent to the ED from facility due to hypoxia.  No evidence of pneumonia noted on chest x-ray.  Patient with increased O2 requirements early on during the hospitalization.  Hypoxia improving patient currently on 3 L nasal cannula with sats of 97%.  D-dimer elevated and initially was worsening by trending back down currently at 3.95 from 6.20 from 7.65 from 7.46 from 3.46.  CRP trending back down and currently at 10.2 from 19.9 from 28.3 from 31 from 3.2.  Continue IV Solu-Medrol 80 mg every 12 hours, empiric IV antibiotics, supportive care.  Follow.   6.  Acute metabolic encephalopathy Likely secondary to sepsis in the setting of history of CVA. Per Dr.Dahal, patient's sister stated at baseline patient is able to open meaningful conversation.  Patient more alert today.  Answering some questions appropriately.  Patient with dysarthric speech.  Patient also noted to have a prior history of TBI and seizures and on multiple mood altering medications.  Continue IV fluids, IV antibiotics, IV steroids.  Follow.    7.  Chronic diastolic CHF/hypertension Currently stable.  Patient noted to be on metoprolol and torsemide twice daily which are on hold due to hypotension.  Decrease IV fluids to 50 cc an hour.  Follow.  8.  Chronic atrial flutter/atrial fibrillation Continue digoxin.  Not an anticoagulation candidate due to history of SDH.  Follow.   9.  History of CVA/coronary artery disease Continue statin, aspirin, Plavix.   10.  Elevated D-dimer with asymmetric edema Lower extremity Dopplers negative for DVT.   Follow.   11.  History of TBI/chronic subdural hematoma/craniectomy We will try to avoid anticoagulants in this setting despite high risk for VTE.  Follow.  12.  Seizure disorder No noted seizures.  Continue gabapentin 300 mg daily, Keppra 125 0 mg every morning, 1000 g nightly, clobazam 40 mg daily, Lamictal 400 mg twice daily.   13.  BPH/urinary retention Noted to have urinary retention per RN.  Patient in acute renal failure.  Foley catheter placed.  Patient with a urine output of 2.3 L over the past 24 hours.  Continue home regimen bethanechol, finasteride, Flomax.  Voiding trial in 2 to 3 days.   14.  Depression SSRI  15.  Hypothyroidism Synthroid.  16.  Dysphagia Currently on dysphagia 3 diet.  Patient has been assessed by SLP.  Aspiration precautions.   17.  Goals of care discussion Dr. Pietro Cassis discussed with patient's [facility] POA who understands patient with a grim prognosis.  It is noted that patient wishes to avoid invasive procedures including central lines. Patient chronically ill at a long-term care facility x12 years, poor functional status, now with acute COVID-19 infection, sepsis, multiorgan failure with acute renal failure, encephalopathy, hypoxia.  Palliative care consulted and goals of care  discussion.  Patient continually slowly improving and will likely require palliative care to follow-up in a long-term care facility once discharged.    DVT prophylaxis: SCDs Code Status: DNR Family Communication: Updated patient.  Tried calling Sister Zigmund Derya Dettmann, no answer.  Disposition:   Status is: Inpatient    Dispo: The patient is from: Long-term care facility              Anticipated d/c is to: Long-term care facility              Anticipated d/c date is: To be determined              Patient currently not stable for discharge.         Consultants:   Palliative care: Dr. Rowe Pavy 10/27/2020  Procedures:   CT head 10/25/2020  Abdominal films 10/24/2020  Chest x-ray 10/24/2020,  10/26/2018  Renal ultrasound 10/26/2020  Lower extremity Dopplers 10/25/2020  Antimicrobials:   IV Zosyn 10/24/2020>>>>  IV remdesivir 10/25/2020>>>> 10/30/2020  IV vancomycin 10/24/2020>>>> 10/25/2020   Subjective: Sitting up eating lunch.  Watching television.  Denies chest pain.  No shortness of breath.  Still with ongoing diarrhea per RN.    Objective: Vitals:   10/29/20 1000 10/29/20 1005 10/29/20 1105 10/29/20 1348  BP:    122/89  Pulse:    93  Resp:    16  Temp:    (!) 97.1 F (36.2 C)  TempSrc:      SpO2: 100% 98% 97% 99%  Weight:      Height:        Intake/Output Summary (Last 24 hours) at 10/29/2020 1540 Last data filed at 10/29/2020 1059 Gross per 24 hour  Intake 733.38 ml  Output 2400 ml  Net -1666.62 ml   Filed Weights   10/26/20 0500 10/27/20 0500 10/29/20 0339  Weight: 134.3 kg (!) 136.5 kg (!) 142.5 kg    Examination:  General exam: NAD.  Deformed head secondary to history of craniotomy Respiratory system: Lungs clear to auscultation bilaterally anterior lung fields.  No wheezing.  No use of accessory muscles of respiration.  Speaking in full sentences.   Cardiovascular system: Regular rate rhythm no murmurs rubs or gallops.  No JVD.  Right lower extremity larger than left lower extremity.  No significant pitting edema. Gastrointestinal system: Abdomen is nontender, nondistended, soft, positive bowel sounds.  No rebound.  No guarding.  Central nervous system: Alert and oriented.  Dysarthric speech.  No focal neurological deficits. Extremities: Right lower extremity larger than left lower extremity.  Skin: No rashes, lesions or ulcers Psychiatry: Judgement and insight appear fair. Mood & affect appropriate.     Data Reviewed: I have personally reviewed following labs and imaging studies  CBC: Recent Labs  Lab 10/24/20 1652 10/25/20 0525 10/26/20 0850 10/27/20 0346 10/28/20 0307 10/29/20 0425  WBC 14.5* 14.7* 16.2* 15.4* 20.5* 14.1*  NEUTROABS 12.8*  --   14.7* 13.5* 18.6*  --   HGB 13.1 13.5 12.3* 11.8* 10.9* 10.6*  HCT 42.8 45.4 39.6 38.7* 35.1* 33.7*  MCV 96.0 98.7 95.2 97.0 94.6 94.7  PLT 210 201 180 163 169 473    Basic Metabolic Panel: Recent Labs  Lab 10/25/20 0525 10/26/20 0850 10/27/20 0346 10/28/20 0307 10/29/20 0425  NA 147* 152* 143 141 143  K 4.8 4.6 3.9 3.8 3.8  CL 106 111 110 107 108  CO2 23 26 23 24 27   GLUCOSE 107* 128* 131* 168* 162*  BUN 60* 73* 61* 55*  46*  CREATININE 2.57* 3.55* 2.26* 1.73* 1.40*  CALCIUM 8.0* 7.9* 8.0* 8.3* 8.3*  MG  --  3.0*  --   --   --   PHOS  --  5.3*  --   --   --    COVID-19 Labs  Recent Labs    10/27/20 0346 10/28/20 0307 10/29/20 0425  DDIMER 7.65* 6.20* 3.95*  FERRITIN 86 105 80  CRP 28.3* 19.9* 10.2*    Lab Results  Component Value Date   SARSCOV2NAA POSITIVE (A) 10/24/2020   GFR: Estimated Creatinine Clearance: 77.9 mL/min (A) (by C-G formula based on SCr of 1.4 mg/dL (H)).  Liver Function Tests: Recent Labs  Lab 10/24/20 1652 10/25/20 0525 10/27/20 0346 10/28/20 0307 10/29/20 0425  AST 33 39 75* 68* 49*  ALT 17 19 38 38 31  ALKPHOS 139* 108 74 82 79  BILITOT 1.2 1.4* 0.6 0.4 0.5  PROT 8.3* 7.5 6.7 6.7 6.0*  ALBUMIN 3.5 3.3* 2.7* 2.7* 2.3*    CBG: No results for input(s): GLUCAP in the last 168 hours.   Recent Results (from the past 240 hour(s))  Resp Panel by RT-PCR (Flu A&B, Covid) Nasopharyngeal Swab     Status: Abnormal   Collection Time: 10/24/20  4:52 PM   Specimen: Nasopharyngeal Swab; Nasopharyngeal(NP) swabs in vial transport medium  Result Value Ref Range Status   SARS Coronavirus 2 by RT PCR POSITIVE (A) NEGATIVE Final    Comment: RESULT CALLED TO, READ BACK BY AND VERIFIED WITH: GREEN F. 01.03.22 @ 2046 BY MECIAL J. (NOTE) SARS-CoV-2 target nucleic acids are DETECTED.  The SARS-CoV-2 RNA is generally detectable in upper respiratory specimens during the acute phase of infection. Positive results are indicative of the presence of  the identified virus, but do not rule out bacterial infection or co-infection with other pathogens not detected by the test. Clinical correlation with patient history and other diagnostic information is necessary to determine patient infection status. The expected result is Negative.  Fact Sheet for Patients: EntrepreneurPulse.com.au  Fact Sheet for Healthcare Providers: IncredibleEmployment.be  This test is not yet approved or cleared by the [place] FDA and  has been authorized for detection and/or diagnosis of SARS-CoV-2 by FDA under an Emergency Use Authorization (EUA).  This EUA will remain in effect (meaning this test ca n be used) for the duration of  the COVID-19 declaration under Section 564(b)(1) of the Act, 21 U.S.C. section 360bbb-3(b)(1), unless the authorization is terminated or revoked sooner.     Influenza A by PCR NEGATIVE NEGATIVE Final   Influenza B by PCR NEGATIVE NEGATIVE Final    Comment: (NOTE) The Xpert Xpress SARS-CoV-2/FLU/RSV plus assay is intended as an aid in the diagnosis of influenza from Nasopharyngeal swab specimens and should not be used as a sole basis for treatment. Nasal washings and aspirates are unacceptable for Xpert Xpress SARS-CoV-2/FLU/RSV testing.  Fact Sheet for Patients: EntrepreneurPulse.com.au  Fact Sheet for Healthcare Providers: IncredibleEmployment.be  This test is not yet approved or cleared by the [place] FDA and has been authorized for detection and/or diagnosis of SARS-CoV-2 by FDA under an Emergency Use Authorization (EUA). This EUA will remain in effect (meaning this test can be used) for the duration of the COVID-19 declaration under Section 564(b)(1) of the Act, 21 U.S.C. section 360bbb-3(b)(1), unless the authorization is terminated or revoked.  Performed at [facility], [place] [address], [place], [place]  [address]   C Difficile Quick Screen w PCR reflex  Status: None   Collection Time: 10/24/20 10:09 PM   Specimen: STOOL  Result Value Ref Range Status   C Diff antigen NEGATIVE NEGATIVE Final   C Diff toxin NEGATIVE NEGATIVE Final   C Diff interpretation No C. difficile detected.  Final    Comment: Performed at [facility], [place] [address], [place], [place] [address]  Gastrointestinal Panel by PCR , Stool     Status: None   Collection Time: 10/24/20 10:09 PM   Specimen: Stool  Result Value Ref Range Status   Campylobacter species NOT DETECTED NOT DETECTED Final   Plesimonas shigelloides NOT DETECTED NOT DETECTED Final   Salmonella species NOT DETECTED NOT DETECTED Final   Yersinia enterocolitica NOT DETECTED NOT DETECTED Final   Vibrio species NOT DETECTED NOT DETECTED Final   Vibrio cholerae NOT DETECTED NOT DETECTED Final   Enteroaggregative E coli (EAEC) NOT DETECTED NOT DETECTED Final   Enteropathogenic E coli (EPEC) NOT DETECTED NOT DETECTED Final   Enterotoxigenic E coli (ETEC) NOT DETECTED NOT DETECTED Final   Shiga like toxin producing E coli (STEC) NOT DETECTED NOT DETECTED Final   Shigella/Enteroinvasive E coli (EIEC) NOT DETECTED NOT DETECTED Final   Cryptosporidium NOT DETECTED NOT DETECTED Final   Cyclospora cayetanensis NOT DETECTED NOT DETECTED Final   Entamoeba histolytica NOT DETECTED NOT DETECTED Final   Giardia lamblia NOT DETECTED NOT DETECTED Final   Adenovirus F40/41 NOT DETECTED NOT DETECTED Final   Astrovirus NOT DETECTED NOT DETECTED Final   Norovirus GI/GII NOT DETECTED NOT DETECTED Final   Rotavirus A NOT DETECTED NOT DETECTED Final   Sapovirus (I, II, IV, and V) NOT DETECTED NOT DETECTED Final    Comment: Performed at [facility], [place] [place], [place] [address]  Blood Culture (routine x 2)     Status: None (Preliminary result)   Collection Time: 10/24/20 10:30 PM   Specimen: BLOOD  Result Value Ref Range Status    Specimen Description   Final    BLOOD RIGHT ANTECUBITAL Performed at [facility], [place] [address] [place], [place] [address]    Special Requests   Final    BOTTLES DRAWN AEROBIC AND ANAEROBIC Blood Culture adequate volume Performed at [place] [address] [place], [place] [address]    Culture   Final    NO GROWTH 4 DAYS Performed at [facility], [place] [address] [place], [place] [address]    Report Status PENDING  Incomplete  Blood Culture (routine x 2)     Status: None (Preliminary result)   Collection Time: 10/24/20 10:30 PM   Specimen: BLOOD  Result Value Ref Range Status   Specimen Description   Final    BLOOD BLOOD LEFT FOREARM Performed at [place] [address], [place], [place] [address]    Special Requests   Final    BOTTLES DRAWN AEROBIC AND ANAEROBIC Blood Culture results may not be optimal due to an inadequate volume of blood received in culture bottles Performed at [place] [address] [place], [place] [address]    Culture   Final    NO GROWTH 4 DAYS Performed at [facility], [place] [address] [place], [place] [address]    Report Status PENDING  Incomplete  MRSA PCR Screening     Status: None   Collection Time: 10/25/20  6:04 PM   Specimen: Nasal Mucosa; Nasopharyngeal  Result Value Ref Range Status  MRSA by PCR NEGATIVE NEGATIVE Final    Comment:        The GeneXpert MRSA Assay (FDA approved for NASAL specimens only), is one component of a comprehensive MRSA colonization surveillance program. It is not intended to diagnose MRSA infection nor to guide or monitor treatment for MRSA infections. Performed at [facility], [place] [address] [place], [place] [address]   Urine culture     Status: None   Collection Time: 10/26/20 12:46 AM   Specimen: Urine, Random  Result Value Ref Range Status   Specimen Description   Final     URINE, RANDOM Performed at [place] [address] [place], [place] [address]    Special Requests   Final    NONE Performed at [facility], [place] [address], [place], [place] [address]    Culture   Final    NO GROWTH Performed at [facility], [place] [address], [place], [place] [address]    Report Status 10/27/2020 FINAL  Final         Radiology Studies: No results found.      Scheduled Meds: . (feeding supplement) PROSource Plus  30 mL Oral BID BM  . amiodarone  200 mg Oral Daily  . aspirin EC  81 mg Oral Daily  . bethanechol  10 mg Oral TID  . chlorhexidine  15 mL Mouth Rinse BID  . Chlorhexidine Gluconate Cloth  6 each Topical Daily  . cloBAZam  40 mg Oral Daily  . clopidogrel  75 mg Oral Daily  . digoxin  0.125 mg Intravenous Daily  . feeding supplement  1 Container Oral BID BM  . finasteride  5 mg Oral Daily  . gabapentin  300 mg Oral QHS  . guaiFENesin  400 mg Oral BID  . Ipratropium-Albuterol  2 puff Inhalation BID  . lamoTRIgine  400 mg Oral BID  . levothyroxine  56 mcg Intravenous Q0600  . loratadine  10 mg Oral Daily  . mouth rinse  15 mL Mouth Rinse q12n4p  . methylPREDNISolone (SOLU-MEDROL) injection  81.25 mg Intravenous BID  . montelukast  10 mg Oral QHS  . multivitamin with minerals  1 tablet Oral Daily  . sodium chloride flush  10-40 mL Intracatheter Q12H  . sodium chloride flush  3 mL Intravenous Q12H  . tamsulosin  0.8 mg Oral QPC supper   Continuous Infusions: . dextrose 75 mL/hr at 10/29/20 1030  . levETIRAcetam 1,250 mg (10/29/20 1056)  . levETIRAcetam 1,000 mg (10/28/20 2227)  . piperacillin-tazobactam (ZOSYN)  IV 3.375 g (10/29/20 1036)     LOS: 5 days    Time spent: 40 minutes    Irine Seal, MD [facility]   To contact the attending provider between 7A-7P or the covering provider during after hours 7P-7A, please log into the web site [email or web address] and access using  universal [place] password for that web site. If you do not have the password, please call the hospital operator.  10/29/2020, 3:40 PM

## 2020-10-30 DIAGNOSIS — A419 Sepsis, unspecified organism: Secondary | ICD-10-CM | POA: Diagnosis not present

## 2020-10-30 DIAGNOSIS — J9601 Acute respiratory failure with hypoxia: Secondary | ICD-10-CM | POA: Diagnosis not present

## 2020-10-30 DIAGNOSIS — R140 Abdominal distension (gaseous): Secondary | ICD-10-CM | POA: Diagnosis not present

## 2020-10-30 DIAGNOSIS — U071 COVID-19: Secondary | ICD-10-CM | POA: Diagnosis not present

## 2020-10-30 LAB — COMPREHENSIVE METABOLIC PANEL
ALT: 29 U/L (ref 0–44)
AST: 38 U/L (ref 15–41)
Albumin: 2.3 g/dL — ABNORMAL LOW (ref 3.5–5.0)
Alkaline Phosphatase: 87 U/L (ref 38–126)
Anion gap: 7 (ref 5–15)
BUN: 42 mg/dL — ABNORMAL HIGH (ref 8–23)
CO2: 29 mmol/L (ref 22–32)
Calcium: 8.7 mg/dL — ABNORMAL LOW (ref 8.9–10.3)
Chloride: 109 mmol/L (ref 98–111)
Creatinine, Ser: 1.2 mg/dL (ref 0.61–1.24)
GFR, Estimated: >60 mL/min (ref >60)
Glucose, Bld: 151 mg/dL — ABNORMAL HIGH (ref 70–99)
Potassium: 4.2 mmol/L (ref 3.5–5.1)
Sodium: 145 mmol/L (ref 135–145)
Total Bilirubin: 0.4 mg/dL (ref 0.3–1.2)
Total Protein: 6.1 g/dL — ABNORMAL LOW (ref 6.5–8.1)

## 2020-10-30 LAB — CULTURE, BLOOD (ROUTINE X 2)
Culture: NO GROWTH
Culture: NO GROWTH
Special Requests: ADEQUATE

## 2020-10-30 LAB — CBC WITH DIFFERENTIAL/PLATELET
Abs Immature Granulocytes: 1.03 10*3/uL — ABNORMAL HIGH (ref 0.00–0.07)
Basophils Absolute: 0 10*3/uL (ref 0.0–0.1)
Basophils Relative: 0 %
Eosinophils Absolute: 0 10*3/uL (ref 0.0–0.5)
Eosinophils Relative: 0 %
HCT: 36 % — ABNORMAL LOW (ref 39.0–52.0)
Hemoglobin: 11.3 g/dL — ABNORMAL LOW (ref 13.0–17.0)
Immature Granulocytes: 7 %
Lymphocytes Relative: 8 %
Lymphs Abs: 1.1 10*3/uL (ref 0.7–4.0)
MCH: 29.4 pg (ref 26.0–34.0)
MCHC: 31.4 g/dL (ref 30.0–36.0)
MCV: 93.8 fL (ref 80.0–100.0)
Monocytes Absolute: 0.8 10*3/uL (ref 0.1–1.0)
Monocytes Relative: 6 %
Neutro Abs: 11.8 10*3/uL — ABNORMAL HIGH (ref 1.7–7.7)
Neutrophils Relative %: 79 %
Platelets: 200 10*3/uL (ref 150–400)
RBC: 3.84 MIL/uL — ABNORMAL LOW (ref 4.22–5.81)
RDW: 15.2 % (ref 11.5–15.5)
WBC: 14.8 10*3/uL — ABNORMAL HIGH (ref 4.0–10.5)
nRBC: 0 % (ref 0.0–0.2)

## 2020-10-30 LAB — C-REACTIVE PROTEIN: CRP: 6.4 mg/dL — ABNORMAL HIGH (ref <1.0)

## 2020-10-30 LAB — MAGNESIUM: Magnesium: 2.6 mg/dL — ABNORMAL HIGH (ref 1.7–2.4)

## 2020-10-30 LAB — FERRITIN: Ferritin: 65 ng/mL (ref 24–336)

## 2020-10-30 LAB — D-DIMER, QUANTITATIVE: D-Dimer, Quant: 3.62 ug/mL-FEU — ABNORMAL HIGH (ref 0.00–0.50)

## 2020-10-30 MED ORDER — LEVOTHYROXINE SODIUM 112 MCG PO TABS: 112.0000 ug | ORAL_TABLET | Freq: Every day | ORAL | Status: DC

## 2020-10-30 MED ORDER — LEVETIRACETAM 500 MG PO TABS
1250.0000 mg | ORAL_TABLET | Freq: Every day | ORAL | Status: DC
  Administered 2020-10-31 – 2020-11-01 (×2): 1250 mg via ORAL
  Filled 2020-10-30 (×2): qty 1

## 2020-10-30 MED ORDER — ESCITALOPRAM OXALATE 10 MG PO TABS
10.0000 mg | ORAL_TABLET | Freq: Every day | ORAL | Status: DC
  Administered 2020-10-30 – 2020-11-01 (×3): 10 mg via ORAL
  Filled 2020-10-30 (×3): qty 1

## 2020-10-30 MED ORDER — LEVOTHYROXINE SODIUM 112 MCG PO TABS
112.0000 ug | ORAL_TABLET | Freq: Every day | ORAL | Status: DC
  Administered 2020-10-31 – 2020-11-01 (×2): 112 ug via ORAL
  Filled 2020-10-30 (×2): qty 1

## 2020-10-30 MED ORDER — LEVETIRACETAM 500 MG PO TABS
1000.0000 mg | ORAL_TABLET | Freq: Every day | ORAL | Status: DC
  Administered 2020-10-30 – 2020-11-01 (×3): 1000 mg via ORAL
  Filled 2020-10-30 (×3): qty 2

## 2020-10-30 NOTE — Progress Notes (Signed)
PROGRESS NOTE    Austin Hartman  XBL:[id number] DOB: Jan 14, 1951 DOA: 10/24/2020 PCP: Bonnita Nasuti, MD    Chief Complaint  Patient presents with  . Covid Positive    10/20/20    Brief Narrative:  Austin Hartman is a 70 y.o. male with PMH significant for TBI, CVA, chronic SDH, s/p craniectomy, epilepsy, and recent covid-19 diagnosis who lives in a long-term care facility.  At baseline, patient is able to transfer himself from bed to wheelchair and rolled around his wheelchair.  At baseline he is able to have a meaningful conversation. Patient was sent to the ED from [facility] on 1/3 for altered mental status, hypothermia, cough, hypoxia to 70s requiring 10 L of oxygen.  In the ED, patient was hypothermic to 94, hypotensive down to 70s and was altered. Labs showed WBC count elevated to 14.5, creatinine elevated to 2.55, lactic acid elevated to 4.8 He had copious diarrhea with negative C. Difficile. EDP discussed poor prognosis with patient's sister who is HCPOA. Visitation was offered, confirmed DNR and desire to avoid invasive procedures including central lines and ventilator. IV fluids, antibiotics were given with slight improvement in blood pressure.  Pt was admitted under hospitalist service Currently care was consulted. Of note patient tested positive for Covid on 12/30 despite history of vaccination x3 latest in December   Assessment & Plan:   Principal Problem:   Severe sepsis [facility]) Active Problems:   Dysphagia   Coronary artery disease involving native coronary artery of native heart with angina pectoris (HCC)   Diarrhea   Hypertensive heart disease with heart failure (HCC)   Chronic constipation   Hypothermia   Epilepsy (HCC)   Hypotension, unspecified   At high risk for aspiration   DNR (do not resuscitate)   Leukocytosis   Hyperlipidemia   Chronic atrial fibrillation (HCC)   Subdural hematoma, chronic (HCC)   Pressure injury of skin   Abdominal  distention   Acute respiratory failure with hypoxia (HCC)   ARF (acute renal failure) (HCC)   COVID-19   Hypernatremia  1 severe sepsis with hypotension Patient met criteria for sepsis with an hypothermia, hypotension, altered mental status.  Lab work was consistent with a leukocytosis, lactic acidosis, elevated procalcitonin.  C. difficile PCR negative.  GI pathogen panel negative.  Urinalysis nitrite negative leukocytes negative.  Blood cultures pending with no growth to date.  Chest x-ray with persistent bilateral atelectasis.  Persistent interstitial prominence left lung.  Small right pleural effusion cannot be excluded.  Blood pressure improving.  Patient also with mild aspiration risk and could likely have aspirated.  Patient initially empirically on IV vancomycin and Zosyn.  Due to worsening renal function vancomycin discontinued.  Patient on IV Zosyn which we will continue for now.  Leukocytosis trending down.  Procalcitonin trending down.  Continue IV antibiotics.  Patient seen by speech therapy with mild aspiration risk and currently on dysphagia 3 diet. Renal function improving with IV fluids of D5W.  Saline lock IV fluids.  Supportive care.  Follow.    2.  Abdominal distention/watery diarrhea C. difficile PCR negative.  GI pathogen panel negative.  Patient with ongoing diarrhea per RN.  Diarrhea likely secondary to COVID-19.  Imodium 4 mg p.o. x1 given 10/28/2020.  Imodium as needed.    3.  Acute renal failure Likely secondary to a prerenal azotemia as patient had presented with severe sepsis and hypotension in the setting of diuretics of Demadex.  Last creatinine noted on epic  in 2017 was 0.84.  Creatinine currently at 1.20 from 1.40 from 1.73 from 2.26 from 3.55.  Patient was n.p.o. early on during the hospitalization, seen by speech therapy and currently on a dysphagia 3 diet.  Renal ultrasound negative for hydronephrosis.  Patient also noted to have diarrhea per RN.  Saline lock IV  fluids.  Strict I's and O's.  Daily weights.  Follow.    4.Hypernatremia Likely secondary to volume depletion.  LR discontinued.  Patient placed on D5W with improvement in sodium currently at 145.  Saline lock IV fluids.  Follow.  5.  Breakthrough COVID-19 infection/acute respiratory failure with hypoxia Patient sent to the ED from facility due to hypoxia.  No evidence of pneumonia noted on chest x-ray.  Patient with increased O2 requirements early on during the hospitalization.  Hypoxia improving patient currently with sats of 96% on room air.  D-dimer elevated and initially was worsening by trending back down currently at 162 from 3.95 from 6.20 from 7.65 from 7.46 from 3.46.  CRP trending back down and currently at 6.4 from 10.2 from 19.9 from 28.3 from 31 from 3.2.  Continue IV Solu-Medrol 80 mg every 12 hours, empiric IV antibiotics, supportive care.  Saline lock IV fluids.  Follow.   6.  Acute metabolic encephalopathy Likely secondary to sepsis in the setting of history of CVA. Per Dr.Dahal, patient's sister stated at baseline patient is able to open meaningful conversation.  Patient more alert today.  Answering some questions appropriately.  Patient with dysarthric speech.  Patient also noted to have a prior history of TBI and seizures and on multiple mood altering medications.  Clinical improvement.  Likely at baseline.  Saline lock IV fluids.  Continue IV antibiotics, IV steroids.  Follow.  7.  Chronic diastolic CHF/hypertension Currently stable.  Patient noted to be on metoprolol and torsemide twice daily which are on hold due to hypotension.  Saline lock IV fluids.  Follow.   8.  Chronic atrial flutter/atrial fibrillation Continue digoxin.  Not an anticoagulation candidate due to history of SDH.  Follow.   9.  History of CVA/coronary artery disease Continue aspirin, Plavix, statin.   10.  Elevated D-dimer with asymmetric edema Lower extremity Dopplers negative for DVT.  Follow.    11.  History of TBI/chronic subdural hematoma/craniectomy We will try to avoid anticoagulants in this setting despite high risk for VTE.  Follow.  12.  Seizure disorder No noted seizures.  Continue gabapentin 300 mg daily, Keppra 125 0 mg every morning, 1000 g nightly, clobazam 40 mg daily, Lamictal 400 mg twice daily.   13.  BPH/urinary retention Noted to have urinary retention per RN.  Patient in acute renal failure.  Foley catheter placed.  Patient with a urine output of 2.3 L over the past 24 hours.  Continue home regimen bethanechol, finasteride, Flomax.  Voiding trial in 1-2 days.   14.  Depression SSRI  15.  Hypothyroidism Synthroid.  16.  Dysphagia Tolerating dysphagia 3 diet.  Aspiration precautions.  Outpatient follow-up with speech therapy.    17.  Goals of care discussion Dr. Pietro Cassis discussed with patient's [facility] POA who understands patient with a grim prognosis.  It is noted that patient wishes to avoid invasive procedures including central lines. Patient chronically ill at a long-term care facility x12 years, poor functional status, now with acute COVID-19 infection, sepsis, multiorgan failure with acute renal failure, encephalopathy, hypoxia.  Palliative care consulted and goals of care discussion.  Patient continually slowly improving  and will likely require palliative care to follow-up in a long-term care facility once discharged.    DVT prophylaxis: SCDs Code Status: DNR Family Communication: Updated patient.    Disposition:   Status is: Inpatient    Dispo: The patient is from: Long-term care facility              Anticipated d/c is to: Long-term care facility              Anticipated d/c date is: To be determined.  Hopefully 1 to 2 days.              Patient currently not stable for discharge.         Consultants:   Palliative care: Dr. Rowe Pavy 10/27/2020  Procedures:   CT head 10/25/2020  Abdominal films 10/24/2020  Chest x-ray 10/24/2020,  10/26/2018  Renal ultrasound 10/26/2020  Lower extremity Dopplers 10/25/2020  Antimicrobials:   IV Zosyn 10/24/2020>>>> 10/31/2020  IV remdesivir 10/25/2020>>>> 10/30/2020  IV vancomycin 10/24/2020>>>> 10/25/2020   Subjective: Sleeping but arousable.  Denies chest pain or shortness of breath.  Currently on room air.  Diarrhea improving.   Objective: Vitals:   10/29/20 1348 10/29/20 2036 10/30/20 0422 10/30/20 0500  BP: 122/89 (!) 138/94 (!) 146/103   Pulse: 93 (!) 101 (!) 103   Resp: _0 Temp: (!) 97.1 F (36.2 C) 97.8 F (36.6 C) 97.8 F (36.6 C)   TempSrc:  Oral Oral   SpO2: 99% 96% 96%   Weight:    (!) 142.8 kg  Height:        Intake/Output Summary (Last 24 hours) at 10/30/2020 1213 Last data filed at 10/30/2020 0900 Gross per 24 hour  Intake 1685 ml  Output 2100 ml  Net -415 ml   Filed Weights   10/27/20 0500 10/29/20 0339 10/30/20 0500  Weight: (!) 136.5 kg (!) 142.5 kg (!) 142.8 kg    Examination:  General exam: NAD.  Deformed head secondary to history of craniotomy Respiratory system: CTA B anterior lung fields.  No wheezes, no crackles, no rhonchi.  Speaking in full sentences.  No use of accessory muscles of respiration.    Cardiovascular system: RRR no murmurs rubs or gallops.  No JVD.  Right lower extremity larger than left lower extremity.  No significant pitting edema.  Gastrointestinal system: Abdomen is soft, nontender, nondistended, positive bowel sounds.  No rebound.  No guarding.   Central nervous system: Alert and oriented.  Dysarthric speech.  No focal neurological deficits. Extremities: Right lower extremity larger than left lower extremity.  Skin: No rashes, lesions or ulcers Psychiatry: Judgement and insight appear fair. Mood & affect appropriate.     Data Reviewed: I have personally reviewed following labs and imaging studies  CBC: Recent Labs  Lab 10/24/20 1652 10/25/20 0525 10/26/20 0850 10/27/20 0346 10/28/20 0307 10/29/20 0425  10/30/20 0430  WBC 14.5*   < > 16.2* 15.4* 20.5* 14.1* 14.8*  NEUTROABS 12.8*  --  14.7* 13.5* 18.6*  --  11.8*  HGB 13.1   < > 12.3* 11.8* 10.9* 10.6* 11.3*  HCT 42.8   < > 39.6 38.7* 35.1* 33.7* 36.0*  MCV 96.0   < > 95.2 97.0 94.6 94.7 93.8  PLT 210   < > 180 163 169 181 200   < > = values in this interval not displayed.    Basic Metabolic Panel: Recent Labs  Lab 10/26/20 0850 10/27/20 0346 10/28/20 0307 10/29/20 0425 10/30/20 0430  NA 152* 143 141 143 145  K 4.6 3.9 3.8 3.8 4.2  CL 111 110 107 108 109  CO2 _0 GLUCOSE 128* 131* 168* 162* 151*  BUN 73* 61* 55* 46* 42*  CREATININE 3.55* 2.26* 1.73* 1.40* 1.20  CALCIUM 7.9* 8.0* 8.3* 8.3* 8.7*  MG 3.0*  --   --   --  2.6*  PHOS 5.3*  --   --   --   --    COVID-19 Labs  Recent Labs    10/28/20 0307 10/29/20 0425 10/30/20 0430  DDIMER 6.20* 3.95* 3.62*  FERRITIN 105 80 65  CRP 19.9* 10.2* 6.4*    Lab Results  Component Value Date   SARSCOV2NAA POSITIVE (A) 10/24/2020   GFR: Estimated Creatinine Clearance: 91 mL/min (by C-G formula based on SCr of 1.2 mg/dL).  Liver Function Tests: Recent Labs  Lab 10/25/20 0525 10/27/20 0346 10/28/20 0307 10/29/20 0425 10/30/20 0430  AST 39 75* 68* 49* 38  ALT 19 38 38 31 29  ALKPHOS 108 74 82 79 87  BILITOT 1.4* 0.6 0.4 0.5 0.4  PROT 7.5 6.7 6.7 6.0* 6.1*  ALBUMIN 3.3* 2.7* 2.7* 2.3* 2.3*    CBG: No results for input(s): GLUCAP in the last 168 hours.   Recent Results (from the past 240 hour(s))  Resp Panel by RT-PCR (Flu A&B, Covid) Nasopharyngeal Swab     Status: Abnormal   Collection Time: 10/24/20  4:52 PM   Specimen: Nasopharyngeal Swab; Nasopharyngeal(NP) swabs in vial transport medium  Result Value Ref Range Status   SARS Coronavirus 2 by RT PCR POSITIVE (A) NEGATIVE Final    Comment: RESULT CALLED TO, READ BACK BY AND VERIFIED WITH: GREEN F. 01.03.22 @ 2046 BY MECIAL J. (NOTE) SARS-CoV-2 target nucleic acids are DETECTED.  The  SARS-CoV-2 RNA is generally detectable in upper respiratory specimens during the acute phase of infection. Positive results are indicative of the presence of the identified virus, but do not rule out bacterial infection or co-infection with other pathogens not detected by the test. Clinical correlation with patient history and other diagnostic information is necessary to determine patient infection status. The expected result is Negative.  Fact Sheet for Patients: EntrepreneurPulse.com.au  Fact Sheet for Healthcare Providers: IncredibleEmployment.be  This test is not yet approved or cleared by the [place] FDA and  has been authorized for detection and/or diagnosis of SARS-CoV-2 by FDA under an Emergency Use Authorization (EUA).  This EUA will remain in effect (meaning this test ca n be used) for the duration of  the COVID-19 declaration under Section 564(b)(1) of the Act, 21 U.S.C. section 360bbb-3(b)(1), unless the authorization is terminated or revoked sooner.     Influenza A by PCR NEGATIVE NEGATIVE Final   Influenza B by PCR NEGATIVE NEGATIVE Final    Comment: (NOTE) The Xpert Xpress SARS-CoV-2/FLU/RSV plus assay is intended as an aid in the diagnosis of influenza from Nasopharyngeal swab specimens and should not be used as a sole basis for treatment. Nasal washings and aspirates are unacceptable for Xpert Xpress SARS-CoV-2/FLU/RSV testing.  Fact Sheet for Patients: EntrepreneurPulse.com.au  Fact Sheet for Healthcare Providers: IncredibleEmployment.be  This test is not yet approved or cleared by the [place] FDA and has been authorized for detection and/or diagnosis of SARS-CoV-2 by FDA under an Emergency Use Authorization (EUA). This EUA will remain in effect (meaning this test can be used) for the duration of the COVID-19 declaration under Section 564(b)(1) of the Act,  21 U.S.C. section  360bbb-3(b)(1), unless the authorization is terminated or revoked.  Performed at [facility], [place] [address], [place], [place] [address]   C Difficile Quick Screen w PCR reflex     Status: None   Collection Time: 10/24/20 10:09 PM   Specimen: STOOL  Result Value Ref Range Status   C Diff antigen NEGATIVE NEGATIVE Final   C Diff toxin NEGATIVE NEGATIVE Final   C Diff interpretation No C. difficile detected.  Final    Comment: Performed at [facility], [place] [address], [place], [place] [address]  Gastrointestinal Panel by PCR , Stool     Status: None   Collection Time: 10/24/20 10:09 PM   Specimen: Stool  Result Value Ref Range Status   Campylobacter species NOT DETECTED NOT DETECTED Final   Plesimonas shigelloides NOT DETECTED NOT DETECTED Final   Salmonella species NOT DETECTED NOT DETECTED Final   Yersinia enterocolitica NOT DETECTED NOT DETECTED Final   Vibrio species NOT DETECTED NOT DETECTED Final   Vibrio cholerae NOT DETECTED NOT DETECTED Final   Enteroaggregative E coli (EAEC) NOT DETECTED NOT DETECTED Final   Enteropathogenic E coli (EPEC) NOT DETECTED NOT DETECTED Final   Enterotoxigenic E coli (ETEC) NOT DETECTED NOT DETECTED Final   Shiga like toxin producing E coli (STEC) NOT DETECTED NOT DETECTED Final   Shigella/Enteroinvasive E coli (EIEC) NOT DETECTED NOT DETECTED Final   Cryptosporidium NOT DETECTED NOT DETECTED Final   Cyclospora cayetanensis NOT DETECTED NOT DETECTED Final   Entamoeba histolytica NOT DETECTED NOT DETECTED Final   Giardia lamblia NOT DETECTED NOT DETECTED Final   Adenovirus F40/41 NOT DETECTED NOT DETECTED Final   Astrovirus NOT DETECTED NOT DETECTED Final   Norovirus GI/GII NOT DETECTED NOT DETECTED Final   Rotavirus A NOT DETECTED NOT DETECTED Final   Sapovirus (I, II, IV, and V) NOT DETECTED NOT DETECTED Final    Comment: Performed at [facility], [place] [place], [place] [address]   Blood Culture (routine x 2)     Status: None (Preliminary result)   Collection Time: 10/24/20 10:30 PM   Specimen: BLOOD  Result Value Ref Range Status   Specimen Description   Final    BLOOD RIGHT ANTECUBITAL Performed at [facility], [place] [address], [place], [place] [address]    Special Requests   Final    BOTTLES DRAWN AEROBIC AND ANAEROBIC Blood Culture adequate volume Performed at [place] [address] [place], [place] [address]    Culture   Final    NO GROWTH 4 DAYS Performed at [facility], [place] [address], [place], [place] [address]    Report Status PENDING  Incomplete  Blood Culture (routine x 2)     Status: None (Preliminary result)   Collection Time: 10/24/20 10:30 PM   Specimen: BLOOD  Result Value Ref Range Status   Specimen Description   Final    BLOOD BLOOD LEFT FOREARM Performed at [place] [address] [place], [place] [address]    Special Requests   Final    BOTTLES DRAWN AEROBIC AND ANAEROBIC Blood Culture results may not be optimal due to an inadequate volume of blood received in culture bottles Performed at [place] [address], [place], [place] [address]    Culture   Final    NO GROWTH 4 DAYS Performed at [facility], [place] [address] [place], [place]  [address]    Report Status PENDING  Incomplete  MRSA PCR Screening     Status: None   Collection Time: 10/25/20  6:04 PM   Specimen: Nasal Mucosa; Nasopharyngeal  Result Value Ref Range Status   MRSA by PCR NEGATIVE NEGATIVE Final    Comment:        The GeneXpert MRSA Assay (FDA approved for NASAL specimens only), is one component of a comprehensive MRSA colonization surveillance program. It is not intended to diagnose MRSA infection nor to guide or monitor treatment for MRSA infections. Performed at [facility], [place] [address], [place], [place] [address]   Urine  culture     Status: None   Collection Time: 10/26/20 12:46 AM   Specimen: Urine, Random  Result Value Ref Range Status   Specimen Description   Final    URINE, RANDOM Performed at [place] [address] [place], [place] [address]    Special Requests   Final    NONE Performed at [facility], [place] [address] [place], [place] [address]    Culture   Final    NO GROWTH Performed at [facility], [place] [address], [place], [place] [address]    Report Status 10/27/2020 FINAL  Final         Radiology Studies: No results found.      Scheduled Meds: . (feeding supplement) PROSource Plus  30 mL Oral BID BM  . amiodarone  200 mg Oral Daily  . aspirin EC  81 mg Oral Daily  . bethanechol  10 mg Oral TID  . chlorhexidine  15 mL Mouth Rinse BID  . Chlorhexidine Gluconate Cloth  6 each Topical Daily  . cloBAZam  40 mg Oral Daily  . clopidogrel  75 mg Oral Daily  . digoxin  0.125 mg Intravenous Daily  . escitalopram  10 mg Oral Daily  . feeding supplement  1 Container Oral BID BM  . finasteride  5 mg Oral Daily  . gabapentin  300 mg Oral QHS  . guaiFENesin  400 mg Oral BID  . Ipratropium-Albuterol  2 puff Inhalation BID  . lamoTRIgine  400 mg Oral BID  . [START ON 10/31/2020] levothyroxine  112 mcg Oral Q0600  . loratadine  10 mg Oral Daily  . mouth rinse  15 mL Mouth Rinse q12n4p  . methylPREDNISolone (SOLU-MEDROL) injection  81.25 mg Intravenous BID  . montelukast  10 mg Oral QHS  . multivitamin with minerals  1 tablet Oral Daily  . sodium chloride flush  10-40 mL Intracatheter Q12H  . sodium chloride flush  3 mL Intravenous Q12H  . tamsulosin  0.8 mg Oral QPC supper   Continuous Infusions: . levETIRAcetam 1,250 mg (10/30/20 0953)  . levETIRAcetam 1,000 mg (10/29/20 2256)  . piperacillin-tazobactam (ZOSYN)  IV 3.375 g (10/30/20 1005)     LOS: 6 days    Time spent: 35 minutes    Irine Seal, MD [facility]   To contact the attending provider between 7A-7P or the covering provider during after hours 7P-7A, please log into the web site [email or web address] and access using universal [place] password for that web site. If you do not have the password, please call the hospital operator.  10/30/2020, 12:13 PM

## 2020-10-31 DIAGNOSIS — U071 COVID-19: Secondary | ICD-10-CM | POA: Diagnosis not present

## 2020-10-31 DIAGNOSIS — R140 Abdominal distension (gaseous): Secondary | ICD-10-CM | POA: Diagnosis not present

## 2020-10-31 DIAGNOSIS — A419 Sepsis, unspecified organism: Secondary | ICD-10-CM | POA: Diagnosis not present

## 2020-10-31 DIAGNOSIS — J9601 Acute respiratory failure with hypoxia: Secondary | ICD-10-CM | POA: Diagnosis not present

## 2020-10-31 LAB — CBC
HCT: 36.6 % — ABNORMAL LOW (ref 39.0–52.0)
Hemoglobin: 11.4 g/dL — ABNORMAL LOW (ref 13.0–17.0)
MCH: 29 pg (ref 26.0–34.0)
MCHC: 31.1 g/dL (ref 30.0–36.0)
MCV: 93.1 fL (ref 80.0–100.0)
Platelets: 222 10*3/uL (ref 150–400)
RBC: 3.93 MIL/uL — ABNORMAL LOW (ref 4.22–5.81)
RDW: 15.3 % (ref 11.5–15.5)
WBC: 15.6 10*3/uL — ABNORMAL HIGH (ref 4.0–10.5)
nRBC: 0 % (ref 0.0–0.2)

## 2020-10-31 LAB — COMPREHENSIVE METABOLIC PANEL
ALT: 26 U/L (ref 0–44)
AST: 32 U/L (ref 15–41)
Albumin: 2.2 g/dL — ABNORMAL LOW (ref 3.5–5.0)
Alkaline Phosphatase: 86 U/L (ref 38–126)
Anion gap: 8 (ref 5–15)
BUN: 41 mg/dL — ABNORMAL HIGH (ref 8–23)
CO2: 27 mmol/L (ref 22–32)
Calcium: 8.7 mg/dL — ABNORMAL LOW (ref 8.9–10.3)
Chloride: 106 mmol/L (ref 98–111)
Creatinine, Ser: 1.16 mg/dL (ref 0.61–1.24)
GFR, Estimated: >60 mL/min (ref >60)
Glucose, Bld: 131 mg/dL — ABNORMAL HIGH (ref 70–99)
Potassium: 4.5 mmol/L (ref 3.5–5.1)
Sodium: 141 mmol/L (ref 135–145)
Total Bilirubin: 0.6 mg/dL (ref 0.3–1.2)
Total Protein: 6.1 g/dL — ABNORMAL LOW (ref 6.5–8.1)

## 2020-10-31 LAB — MAGNESIUM: Magnesium: 2.3 mg/dL (ref 1.7–2.4)

## 2020-10-31 LAB — C-REACTIVE PROTEIN: CRP: 3.6 mg/dL — ABNORMAL HIGH (ref <1.0)

## 2020-10-31 LAB — D-DIMER, QUANTITATIVE: D-Dimer, Quant: 3.1 ug/mL-FEU — ABNORMAL HIGH (ref 0.00–0.50)

## 2020-10-31 LAB — FERRITIN: Ferritin: 62 ng/mL (ref 24–336)

## 2020-10-31 NOTE — Progress Notes (Signed)
PROGRESS NOTE    Austin Hartman  XFG:[id number] DOB: 1951-05-25 DOA: 10/24/2020 PCP: Bonnita Nasuti, MD    Chief Complaint  Patient presents with  . Covid Positive    10/20/20    Brief Narrative:  Austin Hartman is a 70 y.o. male with PMH significant for TBI, CVA, chronic SDH, s/p craniectomy, epilepsy, and recent covid-19 diagnosis who lives in a long-term care facility.  At baseline, patient is able to transfer himself from bed to wheelchair and rolled around his wheelchair.  At baseline he is able to have a meaningful conversation. Patient was sent to the ED from [place] [facility] rehab on 1/3 for altered mental status, hypothermia, cough, hypoxia to 70s requiring 10 L of oxygen.  In the ED, patient was hypothermic to 94, hypotensive down to 70s and was altered. Labs showed WBC count elevated to 14.5, creatinine elevated to 2.55, lactic acid elevated to 4.8 He had copious diarrhea with negative C. Difficile. EDP discussed poor prognosis with patient's sister who is HCPOA. Visitation was offered, confirmed DNR and desire to avoid invasive procedures including central lines and ventilator. IV fluids, antibiotics were given with slight improvement in blood pressure.  Pt was admitted under hospitalist service Currently care was consulted. Of note patient tested positive for Covid on 12/30 despite history of vaccination x3 latest in December   Assessment & Plan:   Principal Problem:   Severe sepsis [facility]) Active Problems:   Dysphagia   Coronary artery disease involving native coronary artery of native heart with angina pectoris (HCC)   Diarrhea   Hypertensive heart disease with heart failure (HCC)   Chronic constipation   Hypothermia   Epilepsy (HCC)   Hypotension, unspecified   At high risk for aspiration   DNR (do not resuscitate)   Leukocytosis   Hyperlipidemia   Chronic atrial fibrillation (HCC)   Subdural hematoma, chronic (HCC)   Pressure injury of skin   Abdominal  distention   Acute respiratory failure with hypoxia (HCC)   ARF (acute renal failure) (HCC)   COVID-19   Hypernatremia  1 severe sepsis with hypotension Patient met criteria for sepsis with an hypothermia, hypotension, altered mental status.  Lab work was consistent with a leukocytosis, lactic acidosis, elevated procalcitonin.  C. difficile PCR negative.  GI pathogen panel negative.  Urinalysis nitrite negative leukocytes negative.  Blood cultures pending with no growth to date.  Chest x-ray with persistent bilateral atelectasis.  Persistent interstitial prominence left lung.  Small right pleural effusion cannot be excluded.  Blood pressure improving.  Patient also with mild aspiration risk and could likely have aspirated.  Patient initially empirically on IV vancomycin and Zosyn.  Due to worsening renal function vancomycin discontinued.  Patient on IV Zosyn which will discontinue after today's doses. Leukocytosis trending down.  Procalcitonin trending down.  Patient seen by speech therapy with mild aspiration risk and currently on dysphagia 3 diet. Renal function improving with IV fluids of D5W.  Saline lock IV fluids.  Supportive care.  Follow.    2.  Abdominal distention/watery diarrhea C. difficile PCR negative.  GI pathogen panel negative.  Patient with ongoing diarrhea per RN.  Diarrhea likely secondary to COVID-19.  Imodium 4 mg p.o. x1 given 10/28/2020.  Diarrhea improved.  Discontinue rectal tube.  Imodium as needed.    3.  Acute renal failure Likely secondary to a prerenal azotemia as patient had presented with severe sepsis and hypotension in the setting of diuretics of Demadex.  Last creatinine noted  on epic in 2017 was 0.84.  Creatinine currently at 1.16 from 1.20 from 1.40 from 1.73 from 2.26 from 3.55.  Urine output of 1.7 L over the past 24 hours.  Patient was n.p.o. early on during the hospitalization, seen by speech therapy and currently on a dysphagia 3 diet.  Renal ultrasound negative  for hydronephrosis.  Patient also noted to have diarrhea per RN.  Saline lock IV fluids.  Strict I's and O's.  Daily weights.  Follow.    4.Hypernatremia Likely secondary to volume depletion.  LR discontinued.  Patient placed on D5W with improvement in sodium currently at 141.  Saline lock IV fluids.  Follow.  5.  Breakthrough COVID-19 infection/acute respiratory failure with hypoxia Patient sent to the ED from facility due to hypoxia.  No evidence of pneumonia noted on chest x-ray.  Patient with increased O2 requirements early on during the hospitalization.  Hypoxia improving patient currently with sats of 96% on room air.  D-dimer elevated and initially was worsening by trending back down currently at 162 from 3.95 from 6.20 from 7.65 from 7.46 from 3.46.  CRP trending back down and currently at 6.4 from 10.2 from 19.9 from 28.3 from 31 from 3.2.  Continue IV Solu-Medrol 80 mg every 12 hours, empiric IV antibiotics, supportive care.  Saline lock IV fluids.  Follow.   6.  Acute metabolic encephalopathy Likely secondary to sepsis in the setting of history of CVA. Per Dr.Dahal, patient's sister stated at baseline patient is able to open meaningful conversation.  Patient more alert today.  Answering some questions appropriately.  Patient with dysarthric speech.  Patient also noted to have a prior history of TBI and seizures and on multiple mood altering medications.  Improved clinically.  Currently at baseline.  IV fluids have been saline lock.  Continue IV antibiotics through today.  Transition from IV steroids down to oral prednisone in the next 24 hours.  Follow.  7.  Chronic diastolic CHF/hypertension Stable.  Patient noted to be on metoprolol and torsemide twice daily which on hold due to hypotension.  IV fluids have been saline lock.  Likely resume on discharge.   8.  Chronic atrial flutter/atrial fibrillation Continue digoxin.  Not an anticoagulation candidate due to history of SDH.  Follow.    9.  History of CVA/coronary artery disease Continue Plavix, statin, aspirin.   10.  Elevated D-dimer with asymmetric edema Lower extremity Dopplers negative for DVT.  Follow.   11.  History of TBI/chronic subdural hematoma/craniectomy We will try to avoid anticoagulants in this setting despite high risk for VTE.  Follow.  12.  Seizure disorder No noted seizures.  Continue gabapentin 300 mg daily, Keppra 125 0 mg every morning, 1000 g nightly, clobazam 40 mg daily, Lamictal 400 mg twice daily.   13.  BPH/urinary retention Noted to have urinary retention per RN.  Patient in acute renal failure.  Foley catheter placed.  Patient with urine output of 1.7 L over the past 24 hours.  Discontinue Foley catheter, voiding trial.    14.  Depression SSRI  15.  Hypothyroidism Synthroid.  16.  Dysphagia Tolerating current dysphagia 3 diet.  Aspiration precautions.  Outpatient follow-up with speech therapy.  17.  Goals of care discussion Dr. Pietro Cassis discussed with patient's [facility] POA who understands patient with a grim prognosis.  It is noted that patient wishes to avoid invasive procedures including central lines. Patient chronically ill at a long-term care facility x12 years, poor functional status, now with  acute COVID-19 infection, sepsis, multiorgan failure with acute renal failure, encephalopathy, hypoxia.  Palliative care consulted and goals of care discussion.  Patient has improved remarkably and will need palliative care to follow patient up in the long-term care facility on discharge.   DVT prophylaxis: SCDs Code Status: DNR Family Communication: Updated patient.  Updated Sister Crews Mccollam on telephone.   Disposition:   Status is: Inpatient    Dispo: The patient is from: Long-term care facility              Anticipated d/c is to: Long-term care facility              Anticipated d/c date is: To be determined.  11/01/2020.              Patient currently not stable for discharge.          Consultants:   Palliative care: Dr. Rowe Pavy 10/27/2020  Procedures:   CT head 10/25/2020  Abdominal films 10/24/2020  Chest x-ray 10/24/2020, 10/26/2018  Renal ultrasound 10/26/2020  Lower extremity Dopplers 10/25/2020  Antimicrobials:   IV Zosyn 10/24/2020>>>> 10/31/2020  IV remdesivir 10/25/2020>>>> 10/30/2020  IV vancomycin 10/24/2020>>>> 10/25/2020   Subjective: Awake.  Denies chest pain.  No shortness of breath.  Currently on room air with sats of 96%.  Feels better.  Tolerating diet.  Objective: Vitals:   10/30/20 1333 10/30/20 2103 10/31/20 0429 10/31/20 0500  BP: (!) 141/98 136/71 140/90   Pulse: (!) 102 (!) 106 (!) 104   Resp: _0 Temp: 97.8 F (36.6 C) 98.1 F (36.7 C) 98.1 F (36.7 C)   TempSrc:  Oral Oral   SpO2: 98% 96% 96%   Weight:    (!) 142.8 kg  Height:        Intake/Output Summary (Last 24 hours) at 10/31/2020 1147 Last data filed at 10/31/2020 0524 Gross per 24 hour  Intake 600 ml  Output 1550 ml  Net -950 ml   Filed Weights   10/29/20 0339 10/30/20 0500 10/31/20 0500  Weight: (!) 142.5 kg (!) 142.8 kg (!) 142.8 kg    Examination:  General exam: NAD.  Deformed head secondary to history of craniotomy Respiratory system: Lungs clear to auscultation bilaterally anterior lung fields.  No wheezes, no crackles, no rhonchi.  Speaking in full sentences.  Normal respiratory effort.  No use of accessory muscles of respiration.  Cardiovascular system: Regular rate rhythm no murmurs rubs or gallops.  No JVD.  Right lower extremity larger than left lower extremity.  No significant pitting edema.  Gastrointestinal system: Abdomen is soft, nontender, nondistended, positive bowel sounds.  No rebound.  No guarding.  Central nervous system: Alert and oriented.  Dysarthric speech.  No focal neurological deficits. Extremities: Right lower extremity larger than left lower extremity.  Skin: No rashes, lesions or ulcers Psychiatry: Judgement and insight appear  fair. Mood & affect appropriate.     Data Reviewed: I have personally reviewed following labs and imaging studies  CBC: Recent Labs  Lab 10/24/20 1652 10/25/20 0525 10/26/20 0850 10/27/20 0346 10/28/20 0307 10/29/20 0425 10/30/20 0430 10/31/20 0248  WBC 14.5*   < > 16.2* 15.4* 20.5* 14.1* 14.8* 15.6*  NEUTROABS 12.8*  --  14.7* 13.5* 18.6*  --  11.8*  --   HGB 13.1   < > 12.3* 11.8* 10.9* 10.6* 11.3* 11.4*  HCT 42.8   < > 39.6 38.7* 35.1* 33.7* 36.0* 36.6*  MCV 96.0   < > 95.2 97.0 94.6  94.7 93.8 93.1  PLT 210   < > 180 163 169 181 200 222   < > = values in this interval not displayed.    Basic Metabolic Panel: Recent Labs  Lab 10/26/20 0850 10/27/20 0346 10/28/20 0307 10/29/20 0425 10/30/20 0430 10/31/20 0248  NA 152* 143 141 143 145 141  K 4.6 3.9 3.8 3.8 4.2 4.5  CL 111 110 107 108 109 106  CO2 _0 GLUCOSE 128* 131* 168* 162* 151* 131*  BUN 73* 61* 55* 46* 42* 41*  CREATININE 3.55* 2.26* 1.73* 1.40* 1.20 1.16  CALCIUM 7.9* 8.0* 8.3* 8.3* 8.7* 8.7*  MG 3.0*  --   --   --  2.6* 2.3  PHOS 5.3*  --   --   --   --   --    COVID-19 Labs  Recent Labs    10/29/20 0425 10/30/20 0430 10/31/20 0248  DDIMER 3.95* 3.62* 3.10*  FERRITIN 80 65 62  CRP 10.2* 6.4* 3.6*    Lab Results  Component Value Date   SARSCOV2NAA POSITIVE (A) 10/24/2020   GFR: Estimated Creatinine Clearance: 94.1 mL/min (by C-G formula based on SCr of 1.16 mg/dL).  Liver Function Tests: Recent Labs  Lab 10/27/20 0346 10/28/20 0307 10/29/20 0425 10/30/20 0430 10/31/20 0248  AST 75* 68* 49* 38 32  ALT 38 38 _1 ALKPHOS 74 82 79 87 86  BILITOT 0.6 0.4 0.5 0.4 0.6  PROT 6.7 6.7 6.0* 6.1* 6.1*  ALBUMIN 2.7* 2.7* 2.3* 2.3* 2.2*    CBG: No results for input(s): GLUCAP in the last 168 hours.   Recent Results (from the past 240 hour(s))  Resp Panel by RT-PCR (Flu A&B, Covid) Nasopharyngeal Swab     Status: Abnormal   Collection Time: 10/24/20  4:52 PM    Specimen: Nasopharyngeal Swab; Nasopharyngeal(NP) swabs in vial transport medium  Result Value Ref Range Status   SARS Coronavirus 2 by RT PCR POSITIVE (A) NEGATIVE Final    Comment: RESULT CALLED TO, READ BACK BY AND VERIFIED WITH: GREEN F. 01.03.22 @ 2046 BY MECIAL J. (NOTE) SARS-CoV-2 target nucleic acids are DETECTED.  The SARS-CoV-2 RNA is generally detectable in upper respiratory specimens during the acute phase of infection. Positive results are indicative of the presence of the identified virus, but do not rule out bacterial infection or co-infection with other pathogens not detected by the test. Clinical correlation with patient history and other diagnostic information is necessary to determine patient infection status. The expected result is Negative.  Fact Sheet for Patients: EntrepreneurPulse.com.au  Fact Sheet for Healthcare Providers: IncredibleEmployment.be  This test is not yet approved or cleared by the [place] FDA and  has been authorized for detection and/or diagnosis of SARS-CoV-2 by FDA under an Emergency Use Authorization (EUA).  This EUA will remain in effect (meaning this test ca n be used) for the duration of  the COVID-19 declaration under Section 564(b)(1) of the Act, 21 U.S.C. section 360bbb-3(b)(1), unless the authorization is terminated or revoked sooner.     Influenza A by PCR NEGATIVE NEGATIVE Final   Influenza B by PCR NEGATIVE NEGATIVE Final    Comment: (NOTE) The Xpert Xpress SARS-CoV-2/FLU/RSV plus assay is intended as an aid in the diagnosis of influenza from Nasopharyngeal swab specimens and should not be used as a sole basis for treatment. Nasal washings and aspirates are unacceptable for Xpert Xpress SARS-CoV-2/FLU/RSV testing.  Fact Sheet for Patients: EntrepreneurPulse.com.au  Fact  Sheet for Healthcare Providers: IncredibleEmployment.be  This test is not  yet approved or cleared by the [place] and has been authorized for detection and/or diagnosis of SARS-CoV-2 by FDA under an Emergency Use Authorization (EUA). This EUA will remain in effect (meaning this test can be used) for the duration of the COVID-19 declaration under Section 564(b)(1) of the Act, 21 U.S.C. section 360bbb-3(b)(1), unless the authorization is terminated or revoked.  Performed at [facility], [place] [address], [place], [place] [address]   C Difficile Quick Screen w PCR reflex     Status: None   Collection Time: 10/24/20 10:09 PM   Specimen: STOOL  Result Value Ref Range Status   C Diff antigen NEGATIVE NEGATIVE Final   C Diff toxin NEGATIVE NEGATIVE Final   C Diff interpretation No C. difficile detected.  Final    Comment: Performed at [facility], [place] [address] [place], [place] [address]  Gastrointestinal Panel by PCR , Stool     Status: None   Collection Time: 10/24/20 10:09 PM   Specimen: Stool  Result Value Ref Range Status   Campylobacter species NOT DETECTED NOT DETECTED Final   Plesimonas shigelloides NOT DETECTED NOT DETECTED Final   Salmonella species NOT DETECTED NOT DETECTED Final   Yersinia enterocolitica NOT DETECTED NOT DETECTED Final   Vibrio species NOT DETECTED NOT DETECTED Final   Vibrio cholerae NOT DETECTED NOT DETECTED Final   Enteroaggregative E coli (EAEC) NOT DETECTED NOT DETECTED Final   Enteropathogenic E coli (EPEC) NOT DETECTED NOT DETECTED Final   Enterotoxigenic E coli (ETEC) NOT DETECTED NOT DETECTED Final   Shiga like toxin producing E coli (STEC) NOT DETECTED NOT DETECTED Final   Shigella/Enteroinvasive E coli (EIEC) NOT DETECTED NOT DETECTED Final   Cryptosporidium NOT DETECTED NOT DETECTED Final   Cyclospora cayetanensis NOT DETECTED NOT DETECTED Final   Entamoeba histolytica NOT DETECTED NOT DETECTED Final   Giardia lamblia NOT DETECTED NOT DETECTED Final   Adenovirus F40/41  NOT DETECTED NOT DETECTED Final   Astrovirus NOT DETECTED NOT DETECTED Final   Norovirus GI/GII NOT DETECTED NOT DETECTED Final   Rotavirus A NOT DETECTED NOT DETECTED Final   Sapovirus (I, II, IV, and V) NOT DETECTED NOT DETECTED Final    Comment: Performed at [facility], [place] [place], [place] [address]  Blood Culture (routine x 2)     Status: None   Collection Time: 10/24/20 10:30 PM   Specimen: BLOOD  Result Value Ref Range Status   Specimen Description   Final    BLOOD RIGHT ANTECUBITAL Performed at [facility], [place] [address] [place], [place] [address]    Special Requests   Final    BOTTLES DRAWN AEROBIC AND ANAEROBIC Blood Culture adequate volume Performed at [place] [address], [place], [place] [address]    Culture   Final    NO GROWTH 5 DAYS Performed at [facility], [place] [address], [place],  [address]    Report Status 10/30/2020 FINAL  Final  Blood Culture (routine x 2)     Status: None   Collection Time: 10/24/20 10:30 PM   Specimen: BLOOD  Result Value Ref Range Status   Specimen Description   Final    BLOOD BLOOD LEFT FOREARM Performed at [place] [address] [place],  [address]    Special Requests   Final    BOTTLES DRAWN AEROBIC  AND ANAEROBIC Blood Culture results may not be optimal due to an inadequate volume of blood received in culture bottles Performed at [facility], [place] [address], [place], [place] [address]    Culture   Final    NO GROWTH 5 DAYS Performed at [facility], [place] [address] [place], [place] [address]    Report Status 10/30/2020 FINAL  Final  MRSA PCR Screening     Status: None   Collection Time: 10/25/20  6:04 PM   Specimen: Nasal Mucosa; Nasopharyngeal  Result Value Ref Range Status   MRSA by PCR NEGATIVE NEGATIVE Final    Comment:        The GeneXpert MRSA Assay (FDA approved for NASAL  specimens only), is one component of a comprehensive MRSA colonization surveillance program. It is not intended to diagnose MRSA infection nor to guide or monitor treatment for MRSA infections. Performed at [facility], [place] [address] [place], [place] [address]   Urine culture     Status: None   Collection Time: 10/26/20 12:46 AM   Specimen: Urine, Random  Result Value Ref Range Status   Specimen Description   Final    URINE, RANDOM Performed at [place] [address], [place], [place] [address]    Special Requests   Final    NONE Performed at [facility], [place] [address] [place], [place] [address]    Culture   Final    NO GROWTH Performed at [facility], [place] [address], [place], [place] [address]    Report Status 10/27/2020 FINAL  Final         Radiology Studies: No results found.      Scheduled Meds: . (feeding supplement) PROSource Plus  30 mL Oral BID BM  . amiodarone  200 mg Oral Daily  . aspirin EC  81 mg Oral Daily  . bethanechol  10 mg Oral TID  . chlorhexidine  15 mL Mouth Rinse BID  . Chlorhexidine Gluconate Cloth  6 each Topical Daily  . cloBAZam  40 mg Oral Daily  . clopidogrel  75 mg Oral Daily  . digoxin  0.125 mg Intravenous Daily  . escitalopram  10 mg Oral Daily  . feeding supplement  1 Container Oral BID BM  . finasteride  5 mg Oral Daily  . gabapentin  300 mg Oral QHS  . guaiFENesin  400 mg Oral BID  . Ipratropium-Albuterol  2 puff Inhalation BID  . lamoTRIgine  400 mg Oral BID  . levETIRAcetam  1,000 mg Oral QHS  . levETIRAcetam  1,250 mg Oral Daily  . levothyroxine  112 mcg Oral Q0600  . loratadine  10 mg Oral Daily  . mouth rinse  15 mL Mouth Rinse q12n4p  . methylPREDNISolone (SOLU-MEDROL) injection  81.25 mg Intravenous BID  . montelukast  10 mg Oral QHS  . multivitamin with minerals  1 tablet Oral Daily  . sodium chloride flush  10-40 mL Intracatheter Q12H   . sodium chloride flush  3 mL Intravenous Q12H  . tamsulosin  0.8 mg Oral QPC supper   Continuous Infusions: . piperacillin-tazobactam (ZOSYN)  IV 3.375 g (10/31/20 1037)     LOS: 7 days    Time spent: 35 minutes    Irine Seal, MD [facility]   To contact the attending provider between 7A-7P or the covering provider during after hours 7P-7A, please log into the web site [email or web address] and access using  universal [address] password for that web site. If you do not have the password, please call the hospital operator.  10/31/2020, 11:47 AM

## 2020-10-31 NOTE — Care Management Important Message (Signed)
Important Message  Patient Details IM Letter given to the Patient. Name: Austin Hartman MRN: [id number] Date of Birth: 1951-02-04   Medicare Important Message Given:  Yes     Caren Macadam 10/31/2020, 11:44 AM

## 2020-10-31 NOTE — Plan of Care (Signed)
  Problem: Education: Goal: Knowledge of General Education information will improve Description: Including pain rating scale, medication(s)/side effects and non-pharmacologic comfort measures Outcome: Progressing   Problem: Coping: Goal: Level of anxiety will decrease Outcome: Progressing   Problem: Safety: Goal: Ability to remain free from injury will improve Outcome: Progressing   Problem: Respiratory: Goal: Will maintain a patent airway Outcome: Progressing

## 2020-10-31 NOTE — Plan of Care (Signed)
  Problem: Education: Goal: Knowledge of General Education information will improve Description: Including pain rating scale, medication(s)/side effects and non-pharmacologic comfort measures Outcome: Progressing   Problem: Coping: Goal: Level of anxiety will decrease Outcome: Progressing   Problem: Safety: Goal: Ability to remain free from injury will improve Outcome: Progressing   

## 2020-11-01 DIAGNOSIS — J9601 Acute respiratory failure with hypoxia: Secondary | ICD-10-CM | POA: Diagnosis not present

## 2020-11-01 DIAGNOSIS — N179 Acute kidney failure, unspecified: Secondary | ICD-10-CM | POA: Diagnosis not present

## 2020-11-01 DIAGNOSIS — I9589 Other hypotension: Secondary | ICD-10-CM

## 2020-11-01 DIAGNOSIS — A419 Sepsis, unspecified organism: Secondary | ICD-10-CM | POA: Diagnosis not present

## 2020-11-01 DIAGNOSIS — D72829 Elevated white blood cell count, unspecified: Secondary | ICD-10-CM

## 2020-11-01 DIAGNOSIS — E861 Hypovolemia: Secondary | ICD-10-CM

## 2020-11-01 DIAGNOSIS — U071 COVID-19: Secondary | ICD-10-CM | POA: Diagnosis not present

## 2020-11-01 LAB — CBC
HCT: 36.9 % — ABNORMAL LOW (ref 39.0–52.0)
Hemoglobin: 11.6 g/dL — ABNORMAL LOW (ref 13.0–17.0)
MCH: 29.1 pg (ref 26.0–34.0)
MCHC: 31.4 g/dL (ref 30.0–36.0)
MCV: 92.7 fL (ref 80.0–100.0)
Platelets: 216 10*3/uL (ref 150–400)
RBC: 3.98 MIL/uL — ABNORMAL LOW (ref 4.22–5.81)
RDW: 15.2 % (ref 11.5–15.5)
WBC: 10 10*3/uL (ref 4.0–10.5)
nRBC: 0 % (ref 0.0–0.2)

## 2020-11-01 LAB — FERRITIN: Ferritin: 51 ng/mL (ref 24–336)

## 2020-11-01 LAB — COMPREHENSIVE METABOLIC PANEL
ALT: 22 U/L (ref 0–44)
AST: 25 U/L (ref 15–41)
Albumin: 2.2 g/dL — ABNORMAL LOW (ref 3.5–5.0)
Alkaline Phosphatase: 87 U/L (ref 38–126)
Anion gap: 8 (ref 5–15)
BUN: 46 mg/dL — ABNORMAL HIGH (ref 8–23)
CO2: 30 mmol/L (ref 22–32)
Calcium: 8.8 mg/dL — ABNORMAL LOW (ref 8.9–10.3)
Chloride: 107 mmol/L (ref 98–111)
Creatinine, Ser: 1.21 mg/dL (ref 0.61–1.24)
GFR, Estimated: >60 mL/min (ref >60)
Glucose, Bld: 143 mg/dL — ABNORMAL HIGH (ref 70–99)
Potassium: 4.6 mmol/L (ref 3.5–5.1)
Sodium: 145 mmol/L (ref 135–145)
Total Bilirubin: 0.6 mg/dL (ref 0.3–1.2)
Total Protein: 6.1 g/dL — ABNORMAL LOW (ref 6.5–8.1)

## 2020-11-01 LAB — C-REACTIVE PROTEIN: CRP: 1.9 mg/dL — ABNORMAL HIGH (ref <1.0)

## 2020-11-01 LAB — MAGNESIUM: Magnesium: 2.2 mg/dL (ref 1.7–2.4)

## 2020-11-01 LAB — D-DIMER, QUANTITATIVE: D-Dimer, Quant: 3.13 ug/mL-FEU — ABNORMAL HIGH (ref 0.00–0.50)

## 2020-11-01 MED ORDER — IPRATROPIUM-ALBUTEROL 20-100 MCG/ACT IN AERS: 2.0000 | INHALATION_SPRAY | Freq: Two times a day (BID) | RESPIRATORY_TRACT | 0 refills | Status: AC

## 2020-11-01 MED ORDER — PREDNISONE 20 MG PO TABS: ORAL_TABLET | ORAL | 0 refills | Status: DC

## 2020-11-01 MED ORDER — TORSEMIDE 20 MG PO TABS: 40.0000 mg | ORAL_TABLET | Freq: Two times a day (BID) | ORAL | Status: AC

## 2020-11-01 NOTE — Discharge Summary (Signed)
Physician Discharge Summary  Lamoyne Hessel BWL:[id number] DOB: 17-Mar-1951 DOA: 10/24/2020  PCP: Bonnita Nasuti, MD  Admit date: 10/24/2020 Discharge date: 11/01/2020  Time spent: 60 minutes  Recommendations for Outpatient Follow-up:  1. Follow-up with MD at skilled nursing facility.  Patient will need basic metabolic profile done in 1 week to follow-up on electrolytes and renal function.  Patient will need follow-up with speech therapy. 2. Follow-up with palliative care at facility.   Discharge Diagnoses:  Principal Problem:   Severe sepsis ([place]) Active Problems:   Dysphagia   Coronary artery disease involving native coronary artery of native heart with angina pectoris (HCC)   Diarrhea   Hypertensive heart disease with heart failure (HCC)   Chronic constipation   Hypothermia   Epilepsy (HCC)   Hypotension, unspecified   At high risk for aspiration   DNR (do not resuscitate)   Leukocytosis   Hyperlipidemia   Chronic atrial fibrillation (HCC)   Subdural hematoma, chronic (HCC)   Pressure injury of skin   Abdominal distention   Acute respiratory failure with hypoxia ([place])   ARF (acute renal failure) ([place])   COVID-19   Hypernatremia   Discharge Condition: Stable and improved  Diet recommendation: Dysphagia 3 diet with thin liquids, aspiration precautions  Filed Weights   10/30/20 0500 10/31/20 0500 11/01/20 0500  Weight: (!) 142.8 kg (!) 142.8 kg (!) 147.1 kg    History of present illness:  HPI per Dr. Cammie Sickle is a 70 y.o. male with a history of TBI, CVA, chronic SDH, s/p craniectomy, epilepsy, and recent covid-19 diagnosis who presented from LTC facility with altered mental status, cough, hypoxia to 70%'s and hypothermia on 10/24/2020. History obtained from records from facility, EDP, and family due to patient's altered mentation.   ED Course: He appeared ill, hypotensive, hypothermic with declining mental status, found to have leukocytosis, ARF, and has had  copious diarrhea with negative C. diff testing. EDP discussed poor prognosis with patient's sister who is HCPOA. Visitation offered, confirmed DNR and desire to avoid invasive procedures including central lines and ventilator. IV fluids, antibiotics have been given with slight improvement in blood pressure. Pt admitted to SDU. Palliative care consulted.    Hospital Course:  1 severe sepsis with hypotension, POA Patient met criteria for sepsis with an hypothermia, hypotension, altered mental status.  Lab work was consistent with a leukocytosis, lactic acidosis, elevated procalcitonin.  C. difficile PCR negative.  GI pathogen panel negative.  Urinalysis nitrite negative leukocytes negative.  Blood cultures done were negative.   Chest x-ray with persistent bilateral atelectasis.  Persistent interstitial prominence left lung.  Small right pleural effusion cannot be excluded.  Blood pressure improving.  Patient also with mild aspiration risk and could likely have aspirated.  Patient initially empirically on IV vancomycin and Zosyn.  Due to worsening renal function vancomycin discontinued.  Patient completed course of IV Zosyn.  Leukocytosis trending down.  Procalcitonin levels trended down.  Patient seen by speech therapy and noted to have mild aspiration risk and placed on dysphagia 3 diet with aspiration precautions which he tolerated.  Patient hydrated with IV fluids and was euvolemic by day of discharge.  Patient improved clinically.  Remained afebrile.  Hypotension had resolved by day of discharge.  Outpatient follow up.  2.  Abdominal distention/watery diarrhea C. difficile PCR negative.  GI pathogen panel negative.   Diarrhea likely secondary to COVID-19.  Diarrhea improved.  Patient also placed on Imodium as needed.  Outpatient follow-up.  3.  Acute renal failure Likely secondary to a prerenal azotemia as patient had presented with severe sepsis and hypotension in the setting of diuretics of  Demadex.  Last creatinine noted on epic in 2017 was 0.84.  Creatinine improved with hydration and holding diuretics.  Patient had good urine output.  Creatinine was down to 1.21 by day of discharge from 3.55.  Patient was n.p.o. early on during the hospitalization, seen by speech therapy and currently on a dysphagia 3 diet.  Renal ultrasound negative for hydronephrosis.  Renal function was back to baseline by day of discharge  4.Hypernatremia Likely secondary to volume depletion.  Patient hydrated with D5W with resolution of hypernatremia by day of discharge.   5.  Breakthrough COVID-19 infection/acute respiratory failure with hypoxia Patient sent to the ED from facility due to hypoxia.  No evidence of pneumonia noted on chest x-ray.  Patient with increased O2 requirements early on during the hospitalization.  Hypoxia improved patient noted with sats of 99% on room air by day of discharge.   D-dimer elevated and initially was worsening but trended back down and was 3.13 by day of discharge from as high as 7.46 during the hospitalization.  CRP trending down and was 1.9 by day of discharge from as high as 31.  Patient received a full course of IV antibiotics, noted to be on IV Solu-Medrol, full course of IV remdesivir, inhalers, vitamin C, and zinc.  Patient be discharged on a prednisone taper.  Outpatient follow-up.  Patient will need to quarantine for 2 more weeks on discharge.  6.  Acute metabolic encephalopathy Likely secondary to sepsis in the setting of history of CVA. Per Dr.Dahal, patient's sister stated at baseline patient is able to open meaningful conversation.    Patient improved clinically.  Patient noted to have dysarthric speech intermittently. Patient also noted to have a prior history of TBI and seizures and on multiple mood altering medications.  Improved clinically.    Patient received a full course of IV antibiotics as well as treatment for COVID-19.  Patient improved clinically and  be discharged back to long-term care facility in stable and improved condition.    7.  Chronic diastolic CHF/hypertension Stable.  Patient noted to be on metoprolol and torsemide twice daily which on hold due to hypotension.    Patient hydrated with IV fluids.  Torsemide may be resumed 3 to 4 days postdischarge.  Patient was discharged on home regimen metoprolol.  Outpatient follow-up.    8.  Chronic atrial flutter/atrial fibrillation Patient maintained on home regimen of digoxin.  Not an anticoagulation candidate due to history of SDH.  Follow.   9.  History of CVA/coronary artery disease Patient maintained on home regimen of Plavix, statin, aspirin.   10.  Elevated D-dimer with asymmetric edema Lower extremity Dopplers negative for DVT.    11.  History of TBI/chronic subdural hematoma/craniectomy Anticoagulation was avoided in this setting.  Outpatient follow-up.  12.  Seizure disorder No noted seizures during the hospitalization.    Patient maintained on home regimen of gabapentin 300 mg daily, Keppra 125 0 mg every morning, 1000 g nightly, clobazam 40 mg daily, Lamictal 400 mg twice daily.   13.  BPH/urinary retention Noted to have urinary retention per RN.  Patient in acute renal failure.  Foley catheter placed.    Patient maintained on home regimen of bethanechol, Flomax, Proscar.  Patient underwent voiding trial which was successful.  Outpatient follow-up.   14.  Depression Patient maintained  on SSRI  15.  Hypothyroidism Patient maintained on Synthroid.  16.  Dysphagia Patient is seen in consultation by speech therapy.  Patient was placed on a dysphagia 3 diet with aspiration precautions which she tolerated.  Outpatient follow-up with speech therapy.   17.  Goals of care discussion Dr. Pietro Cassis discussed with patient's [facility] POA who understands patient with a grim prognosis.  It is noted that patient wishes to avoid invasive procedures including central lines. Patient  chronically ill at a long-term care facility x12 years, poor functional status, now with acute COVID-19 infection, sepsis, multiorgan failure with acute renal failure, encephalopathy, hypoxia.  Palliative care consulted and goals of care discussion.  Patient improved remarkably and will need palliative care to follow patient up in the long-term care facility on discharge.  Procedures:  CT head 10/25/2020  Abdominal films 10/24/2020  Chest x-ray 10/24/2020, 10/26/2018  Renal ultrasound 10/26/2020  Lower extremity Dopplers 10/25/2020    Consultations:  Palliative care: Dr. Rowe Pavy 10/27/2020    Discharge Exam: Vitals:   11/01/20 1111 11/01/20 1421  BP: 128/86 (!) 147/92  Pulse: (!) 105 (!) 102  Resp:  20  Temp:    SpO2:  99%    General: NAD Cardiovascular: RRR Respiratory: CTAB  Discharge Instructions   Discharge Instructions    Diet - low sodium heart healthy   Complete by: As directed    Dysphagia 3 diet with thin liquids   Discharge instructions   Complete by: As directed    Please quantarantine for 2 weeks.  ?   Person Under Monitoring Name: Jaicion Laurie  Location: [place] [id number]   Infection Prevention Recommendations for Individuals Confirmed to have, or Being Evaluated for, 2019 Novel Coronavirus (COVID-19) Infection Who Receive Care at Home  Individuals who are confirmed to have, or are being evaluated for, COVID-19 should follow the prevention steps below until a healthcare provider or local or state health department says they can return to normal activities.  Stay home except to get medical care You should restrict activities outside your home, except for getting medical care. Do not go to work, school, or public areas, and do not use public transportation or taxis.  Call ahead before visiting your doctor Before your medical appointment, call the healthcare provider and tell them that you have, or are being evaluated  for, COVID-19 infection. This will help the healthcare provider's office take steps to keep other people from getting infected. Ask your healthcare provider to call the local or state health department.  Monitor your symptoms Seek prompt medical attention if your illness is worsening (e.g., difficulty breathing). Before going to your medical appointment, call the healthcare provider and tell them that you have, or are being evaluated for, COVID-19 infection. Ask your healthcare provider to call the local or state health department.  Wear a facemask You should wear a facemask that covers your nose and mouth when you are in the same room with other people and when you visit a healthcare provider. People who live with or visit you should also wear a facemask while they are in the same room with you.  Separate yourself from other people in your home As much as possible, you should stay in a different room from other people in your home. Also, you should use a separate bathroom, if available.  Avoid sharing household items You should not share dishes, drinking glasses, cups, eating utensils, towels, bedding, or other items with other people in your  home. After using these items, you should wash them thoroughly with soap and water.  Cover your coughs and sneezes Cover your mouth and nose with a tissue when you cough or sneeze, or you can cough or sneeze into your sleeve. Throw used tissues in a lined trash can, and immediately wash your hands with soap and water for at least 20 seconds or use an alcohol-based hand rub.  Wash your [facility] your hands often and thoroughly with soap and water for at least 20 seconds. You can use an alcohol-based hand sanitizer if soap and water are not available and if your hands are not visibly dirty. Avoid touching your eyes, nose, and mouth with unwashed hands.   Prevention Steps for Caregivers and Household Members of Individuals Confirmed to have, or  Being Evaluated for, COVID-19 Infection Being Cared for in the Home  If you live with, or provide care at home for, a person confirmed to have, or being evaluated for, COVID-19 infection please follow these guidelines to prevent infection:  Follow healthcare provider's instructions Make sure that you understand and can help the patient follow any healthcare provider instructions for all care.  Provide for the patient's basic needs You should help the patient with basic needs in the home and provide support for getting groceries, prescriptions, and other personal needs.  Monitor the patient's symptoms If they are getting sicker, call his or her medical provider and tell them that the patient has, or is being evaluated for, COVID-19 infection. This will help the healthcare provider's office take steps to keep other people from getting infected. Ask the healthcare provider to call the local or state health department.  Limit the number of people who have contact with the patient If possible, have only one caregiver for the patient. Other household members should stay in another home or place of residence. If this is not possible, they should stay in another room, or be separated from the patient as much as possible. Use a separate bathroom, if available. Restrict visitors who do not have an essential need to be in the home.  Keep older adults, very young children, and other sick people away from the patient Keep older adults, very young children, and those who have compromised immune systems or chronic health conditions away from the patient. This includes people with chronic heart, lung, or kidney conditions, diabetes, and cancer.  Ensure good ventilation Make sure that shared spaces in the home have good air flow, such as from an air conditioner or an opened window, weather permitting.  Wash your hands often Wash your hands often and thoroughly with soap and water for at least 20  seconds. You can use an alcohol based hand sanitizer if soap and water are not available and if your hands are not visibly dirty. Avoid touching your eyes, nose, and mouth with unwashed hands. Use disposable paper towels to dry your hands. If not available, use dedicated cloth towels and replace them when they become wet.  Wear a facemask and gloves Wear a disposable facemask at all times in the room and gloves when you touch or have contact with the patient's blood, body fluids, and/or secretions or excretions, such as sweat, saliva, sputum, nasal mucus, vomit, urine, or feces.  Ensure the mask fits over your nose and mouth tightly, and do not touch it during use. Throw out disposable facemasks and gloves after using them. Do not reuse. Wash your hands immediately after removing your facemask and gloves. If  your personal clothing becomes contaminated, carefully remove clothing and launder. Wash your hands after handling contaminated clothing. Place all used disposable facemasks, gloves, and other waste in a lined container before disposing them with other household waste. Remove gloves and wash your hands immediately after handling these items.  Do not share dishes, glasses, or other household items with the patient Avoid sharing household items. You should not share dishes, drinking glasses, cups, eating utensils, towels, bedding, or other items with a patient who is confirmed to have, or being evaluated for, COVID-19 infection. After the person uses these items, you should wash them thoroughly with soap and water.  Wash laundry thoroughly Immediately remove and wash clothes or bedding that have blood, body fluids, and/or secretions or excretions, such as sweat, saliva, sputum, nasal mucus, vomit, urine, or feces, on them. Wear gloves when handling laundry from the patient. Read and follow directions on labels of laundry or clothing items and detergent. In general, wash and dry with the warmest  temperatures recommended on the label.  Clean all areas the individual has used often Clean all touchable surfaces, such as counters, tabletops, doorknobs, bathroom fixtures, toilets, phones, keyboards, tablets, and bedside tables, every day. Also, clean any surfaces that may have blood, body fluids, and/or secretions or excretions on them. Wear gloves when cleaning surfaces the patient has come in contact with. Use a diluted bleach solution (e.g., dilute bleach with 1 part bleach and 10 parts water) or a household disinfectant with a label that says EPA-registered for coronaviruses. To make a bleach solution at home, add 1 tablespoon of bleach to 1 quart (4 cups) of water. For a larger supply, add  cup of bleach to 1 gallon (16 cups) of water. Read labels of cleaning products and follow recommendations provided on product labels. Labels contain instructions for safe and effective use of the cleaning product including precautions you should take when applying the product, such as wearing gloves or eye protection and making sure you have good ventilation during use of the product. Remove gloves and wash hands immediately after cleaning.  Monitor yourself for signs and symptoms of illness Caregivers and household members are considered close contacts, should monitor their health, and will be asked to limit movement outside of the home to the extent possible. Follow the monitoring steps for close contacts listed on the symptom monitoring form.   ? If you have additional questions, contact your local health department or call the epidemiologist on call at [phone] (available 24/7). ? This guidance is subject to change. For the most up-to-date guidance from [facility], please refer to their website: YouBlogs.pl   Discharge wound care:   Complete by: As directed    As above   Increase activity slowly   Complete by: As directed       Allergies as of 11/01/2020      Reactions   Acetaminophen Other (See Comments)   unknown   Azithromycin Other (See Comments)   unknown   Biaxin [clarithromycin] Other (See Comments)   unknown   Diltiazem Other (See Comments)   unknown   Other Other (See Comments)   Darvocet-N - unknown   Verapamil Other (See Comments)   unknown      Medication List    TAKE these medications   acetaminophen 325 MG tablet Commonly known as: TYLENOL Take 650 mg by mouth every 6 (six) hours as needed for moderate pain.   amiodarone 200 MG tablet Commonly known as: PACERONE Take 200 mg by  mouth daily.   ammonium lactate 12 % cream Commonly known as: AMLACTIN Apply 1 g topically at bedtime. Both legs/feet   aspirin EC 81 MG tablet Take 81 mg by mouth daily.   atorvastatin 20 MG tablet Commonly known as: LIPITOR Take 20 mg by mouth daily.   bethanechol 10 MG tablet Commonly known as: URECHOLINE Take 10 mg by mouth 3 (three) times daily.   Biofreeze 4 % Gel Generic drug: Menthol (Topical Analgesic) Apply 1 application topically 2 (two) times daily.   bisacodyl 10 MG suppository Commonly known as: DULCOLAX Place 10 mg rectally 2 (two) times daily as needed for moderate constipation.   cloBAZam 10 MG tablet Commonly known as: ONFI Take 40 mg by mouth daily.   clopidogrel 75 MG tablet Commonly known as: PLAVIX Take 75 mg by mouth daily.   Cranberry 450 MG Caps Take 450 mg by mouth daily.   digoxin 0.125 MG tablet Commonly known as: LANOXIN Take 125 mcg daily by mouth.   escitalopram 10 MG tablet Commonly known as: LEXAPRO Take 10 mg by mouth daily.   finasteride 5 MG tablet Commonly known as: PROSCAR Take 5 mg by mouth daily.   gabapentin 300 MG capsule Commonly known as: NEURONTIN Take 300 mg by mouth at bedtime.   guaifenesin 400 MG Tabs tablet Commonly known as: HUMIBID E Take 400 mg by mouth 2 (two) times daily.   Ipratropium-Albuterol 20-100 MCG/ACT Aers  respimat Commonly known as: COMBIVENT Inhale 2 puffs into the lungs 2 (two) times daily. Take 2 times daily x 5 days then every 6 hours as needed.   lamoTRIgine 200 MG tablet Commonly known as: LAMICTAL Take 400 mg by mouth 2 (two) times daily.   levETIRAcetam 250 MG tablet Commonly known as: KEPPRA Take 250 mg by mouth daily. Takes along with 1000 mg tablet to equal 1250 mg in the morning.   levETIRAcetam 1000 MG tablet Commonly known as: KEPPRA Take 1,000 mg by mouth 2 (two) times daily.   Levothyroxine Sodium 112 MCG Caps Take 112 mcg by mouth daily before breakfast.   linaclotide 290 MCG Caps capsule Commonly known as: LINZESS Take 290 mcg by mouth daily.   loratadine 10 MG tablet Commonly known as: CLARITIN Take 10 mg by mouth daily.   meloxicam 15 MG tablet Commonly known as: MOBIC Take 15 mg by mouth daily.   metoprolol tartrate 25 MG tablet Commonly known as: LOPRESSOR Take 12.5 mg by mouth 3 (three) times daily.   MILK OF MAGNESIA PO Take 30 mLs by mouth daily as needed (constipation).   montelukast 10 MG tablet Commonly known as: SINGULAIR Take 10 mg by mouth at bedtime.   MULTIVITAMIN PO Take 1 tablet by mouth daily.   pantoprazole 20 MG tablet Commonly known as: PROTONIX Take 20 mg by mouth daily. What changed: Another medication with the same name was removed. Continue taking this medication, and follow the directions you see here.   polyethylene glycol 17 g packet Commonly known as: MIRALAX / GLYCOLAX Take 17 g by mouth daily.   predniSONE 20 MG tablet Commonly known as: DELTASONE Take 3 tablets (60 mg total) by mouth daily with breakfast for 3 days, THEN 2 tablets (40 mg total) daily with breakfast for 3 days, THEN 1 tablet (20 mg total) daily with breakfast for 3 days. Start taking on: November 01, 2020   sennosides-docusate sodium 8.6-50 MG tablet Commonly known as: SENOKOT-S Take 2 tablets by mouth 2 (two) times daily.  sodium fluoride  1.1 % Crea dental cream Commonly known as: PREVIDENT 5000 PLUS Place 1 application onto teeth 2 (two) times daily.   tamsulosin 0.4 MG Caps capsule Commonly known as: FLOMAX Take 0.8 mg by mouth daily after supper.   torsemide 20 MG tablet Commonly known as: Demadex Take 2 tablets (40 mg total) by mouth 2 (two) times daily. Start taking on: November 04, 2020 What changed:   how much to take  These instructions start on November 04, 2020. If you are unsure what to do until then, ask your doctor or other care provider.            Discharge Care Instructions  (From admission, onward)         Start     Ordered   11/01/20 0000  Discharge wound care:       Comments: As above   11/01/20 1510         Allergies  Allergen Reactions  . Acetaminophen Other (See Comments)    unknown  . Azithromycin Other (See Comments)    unknown  . Biaxin [Clarithromycin] Other (See Comments)    unknown  . Diltiazem Other (See Comments)    unknown  . Other Other (See Comments)    Darvocet-N - unknown  . Verapamil Other (See Comments)    unknown    Follow-up Information    [facility] Follow up.   Specialty: [place] Contact information: [address] [phone]       [facility] SNF Follow up.        [facility] Follow up.                The results of significant diagnostics from this hospitalization (including imaging, microbiology, ancillary and laboratory) are listed below for reference.    Significant Diagnostic Studies: CT Head Wo Contrast  Result Date: 10/25/2020 CLINICAL DATA:  COVID-19 positive 10/10/2020, traumatic brain injury, altered level of consciousness EXAM: CT HEAD WITHOUT CONTRAST TECHNIQUE: Contiguous axial images were obtained from the base of the skull through the vertex without intravenous contrast. COMPARISON:  None. FINDINGS: Brain: Diffuse encephalomalacia throughout the  left cerebral hemisphere consistent with postsurgical and posttraumatic change. No signs of acute infarct or hemorrhage. Ex vacuo dilatation of the lateral ventricle. Midline structures are unremarkable. No acute extra-axial fluid collections. No mass effect. Vascular: No hyperdense vessel or unexpected calcification. Skull: Previous left frontotemporal craniectomy. No acute fractures. Sinuses/Orbits: Minimal polypoid mucosal thickening right maxillary sinus. Remaining sinuses are clear. Other: Reconstructed images demonstrate no additional findings. IMPRESSION: 1. Large left frontotemporal craniectomy, with underlying left cerebral hemispheric encephalomalacia. 2. No acute infarct or hemorrhage. Electronically Signed   By: Randa Ngo M.D.   On: 10/25/2020 03:59   US RENAL  Result Date: 10/26/2020 CLINICAL DATA:  Acute renal failure. EXAM: RENAL / URINARY TRACT ULTRASOUND COMPLETE COMPARISON:  Abdominal ultrasound 04/09/2016 FINDINGS: Right Kidney: Renal measurements: 10.6 x 5.1 x 5.1 cm = volume: 143 mL. Diffuse cortical thinning with increased echogenicity. No mass or hydronephrosis visualized. Left Kidney: Renal measurements: 9.1 x 5.6 x 5.2 cm = volume: 140 mL. Diffuse cortical thinning with increased echogenicity. No mass or hydronephrosis visualized. Bladder: Decompressed by Foley catheter. Other: None. IMPRESSION: Bilateral renal cortical thinning without hydronephrosis. Electronically Signed   By: Misty Stanley M.D.   On: 10/26/2020 10:03   DG CHEST PORT 1 VIEW  Result Date: 10/26/2020 CLINICAL DATA:  Respiratory failure.  COVID. EXAM: PORTABLE CHEST 1 VIEW COMPARISON:  10/24/2020. FINDINGS: Patient is rotated to the right. Thoracic aorta again noted be tortuous. Stable cardiomegaly. Low lung volumes with persistent bilateral atelectasis. Atelectasis progressed in the right lung base. Persistent interstitial prominence left lung. Small right pleural effusion cannot be excluded. No pneumothorax. No  acute bony abnormality identified. IMPRESSION: 1. Low lung volumes with persistent bilateral atelectasis. Atelectasis progressed in the right lung base. Persistent interstitial prominence left lung. Small right pleural effusion cannot be excluded. 2. Stable cardiomegaly. Electronically Signed   By: Marcello Moores  Register   On: 10/26/2020 05:34   DG Chest Port 1 View  Result Date: 10/24/2020 CLINICAL DATA:  71 year old male with positive COVID-19 and hypoxia. EXAM: PORTABLE CHEST 1 VIEW COMPARISON:  Chest radiograph dated 03/30/2016. FINDINGS: Diffuse interstitial coarsening. Bilateral mid to lower lung field streaky densities, likely atelectasis. Developing infiltrate is not excluded. No lobar consolidation, pleural effusion or pneumothorax. The right costophrenic angle is obscured due to superimposed support device. Stable cardiomegaly. Hiatal hernia. No acute osseous pathology. IMPRESSION: Bilateral mid to lower lung field streaky densities, likely atelectasis. Developing infiltrate is not excluded. Electronically Signed   By: Anner Crete M.D.   On: 10/24/2020 17:59   DG Abd 2 Views  Result Date: 10/24/2020 CLINICAL DATA:  COVID positive with low oxygen saturation and abdominal distension. EXAM: ABDOMEN - 2 VIEW COMPARISON:  None. FINDINGS: The bowel gas pattern is normal. A large amount of stool is seen within the distal sigmoid colon. There is no evidence of free air. No radio-opaque calculi or other significant radiographic abnormality is seen. IMPRESSION: 1. No evidence of bowel obstruction. Electronically Signed   By: Virgina Norfolk M.D.   On: 10/24/2020 21:42   VAS [place] LOWER EXTREMITY VENOUS (DVT)  Result Date: 10/25/2020  Lower Venous DVT Study Other Indications: Covid, D-Dimer. Limitations: Body habitus and Pt unresponsive to instructions. Comparison Study: No previous exam Performing Technologist: Vonzell Schlatter RVT  Examination Guidelines: A complete evaluation includes B-mode imaging, spectral  Doppler, color Doppler, and power Doppler as needed of all accessible portions of each vessel. Bilateral testing is considered an integral part of a complete examination. Limited examinations for reoccurring indications may be performed as noted. The reflux portion of the exam is performed with the patient in reverse Trendelenburg.  +---------+---------------+---------+-----------+----------+--------------+ RIGHT    CompressibilityPhasicitySpontaneityPropertiesThrombus Aging +---------+---------------+---------+-----------+----------+--------------+ CFV      Full           Yes      Yes                                 +---------+---------------+---------+-----------+----------+--------------+ SFJ      Full                                                        +---------+---------------+---------+-----------+----------+--------------+ FV Prox  Full                                                        +---------+---------------+---------+-----------+----------+--------------+ FV Mid   Full                                                        +---------+---------------+---------+-----------+----------+--------------+  FV DistalFull                                                        +---------+---------------+---------+-----------+----------+--------------+ PFV      Full                                                        +---------+---------------+---------+-----------+----------+--------------+ POP      Full           Yes      Yes                                 +---------+---------------+---------+-----------+----------+--------------+ PTV      Full                                                        +---------+---------------+---------+-----------+----------+--------------+ PERO     Full                                                        +---------+---------------+---------+-----------+----------+--------------+    +---------+---------------+---------+-----------+----------+-------------------+ LEFT     CompressibilityPhasicitySpontaneityPropertiesThrombus Aging      +---------+---------------+---------+-----------+----------+-------------------+ CFV      Full           Yes      Yes                                      +---------+---------------+---------+-----------+----------+-------------------+ SFJ      Full                                                             +---------+---------------+---------+-----------+----------+-------------------+ FV Prox  Full                                                             +---------+---------------+---------+-----------+----------+-------------------+ FV Mid   Full                                                             +---------+---------------+---------+-----------+----------+-------------------+ FV DistalFull                                                             +---------+---------------+---------+-----------+----------+-------------------+  PFV      Full                                                             +---------+---------------+---------+-----------+----------+-------------------+ POP                                                   Not well visualized +---------+---------------+---------+-----------+----------+-------------------+ PTV      Full                                                             +---------+---------------+---------+-----------+----------+-------------------+ PERO     Full                                                             +---------+---------------+---------+-----------+----------+-------------------+   Left Technical Findings: Not visualized segments include Popliteal vein.   Summary: RIGHT: - There is no evidence of deep vein thrombosis in the lower extremity.  - No cystic structure found in the popliteal fossa.  LEFT: - There is no evidence  of deep vein thrombosis in the lower extremity. However, portions of this examination were limited- see technologist comments above.  - No cystic structure found in the popliteal fossa.  *See table(s) above for measurements and observations. Electronically signed by Curt Jews MD on 10/25/2020 at 2:02:01 PM.    Final    [place] EKG SITE RITE  Result Date: 10/24/2020 If Site Rite image not attached, placement could not be confirmed due to current cardiac rhythm.   Microbiology: Recent Results (from the past 240 hour(s))  Resp Panel by RT-PCR (Flu A&B, Covid) Nasopharyngeal Swab     Status: Abnormal   Collection Time: 10/24/20  4:52 PM   Specimen: Nasopharyngeal Swab; Nasopharyngeal(NP) swabs in vial transport medium  Result Value Ref Range Status   SARS Coronavirus 2 by RT PCR POSITIVE (A) NEGATIVE Final    Comment: RESULT CALLED TO, READ BACK BY AND VERIFIED WITH: GREEN F. 01.03.22 @ 2046 BY MECIAL J. (NOTE) SARS-CoV-2 target nucleic acids are DETECTED.  The SARS-CoV-2 RNA is generally detectable in upper respiratory specimens during the acute phase of infection. Positive results are indicative of the presence of the identified virus, but do not rule out bacterial infection or co-infection with other pathogens not detected by the test. Clinical correlation with patient history and other diagnostic information is necessary to determine patient infection status. The expected result is Negative.  Fact Sheet for Patients: EntrepreneurPulse.com.au  Fact Sheet for Healthcare Providers: IncredibleEmployment.be  This test is not yet approved or cleared by the [place] FDA and  has been authorized for detection and/or diagnosis of SARS-CoV-2 by FDA under an Emergency Use Authorization (EUA).  This EUA will remain in effect (meaning this test ca n be used)  for the duration of  the COVID-19 declaration under Section 564(b)(1) of the Act, 21 U.S.C. section  360bbb-3(b)(1), unless the authorization is terminated or revoked sooner.     Influenza A by PCR NEGATIVE NEGATIVE Final   Influenza B by PCR NEGATIVE NEGATIVE Final    Comment: (NOTE) The Xpert Xpress SARS-CoV-2/FLU/RSV plus assay is intended as an aid in the diagnosis of influenza from Nasopharyngeal swab specimens and should not be used as a sole basis for treatment. Nasal washings and aspirates are unacceptable for Xpert Xpress SARS-CoV-2/FLU/RSV testing.  Fact Sheet for Patients: EntrepreneurPulse.com.au  Fact Sheet for Healthcare Providers: IncredibleEmployment.be  This test is not yet approved or cleared by the [place] FDA and has been authorized for detection and/or diagnosis of SARS-CoV-2 by FDA under an Emergency Use Authorization (EUA). This EUA will remain in effect (meaning this test can be used) for the duration of the COVID-19 declaration under Section 564(b)(1) of the Act, 21 U.S.C. section 360bbb-3(b)(1), unless the authorization is terminated or revoked.  Performed at [facility], [place] [address] [place], [place] [address]   C Difficile Quick Screen w PCR reflex     Status: None   Collection Time: 10/24/20 10:09 PM   Specimen: STOOL  Result Value Ref Range Status   C Diff antigen NEGATIVE NEGATIVE Final   C Diff toxin NEGATIVE NEGATIVE Final   C Diff interpretation No C. difficile detected.  Final    Comment: Performed at [facility], [place] [address] [place], [place] [address]  Gastrointestinal Panel by PCR , Stool     Status: None   Collection Time: 10/24/20 10:09 PM   Specimen: Stool  Result Value Ref Range Status   Campylobacter species NOT DETECTED NOT DETECTED Final   Plesimonas shigelloides NOT DETECTED NOT DETECTED Final   Salmonella species NOT DETECTED NOT DETECTED Final   Yersinia enterocolitica NOT DETECTED NOT DETECTED Final   Vibrio species NOT DETECTED NOT  DETECTED Final   Vibrio cholerae NOT DETECTED NOT DETECTED Final   Enteroaggregative E coli (EAEC) NOT DETECTED NOT DETECTED Final   Enteropathogenic E coli (EPEC) NOT DETECTED NOT DETECTED Final   Enterotoxigenic E coli (ETEC) NOT DETECTED NOT DETECTED Final   Shiga like toxin producing E coli (STEC) NOT DETECTED NOT DETECTED Final   Shigella/Enteroinvasive E coli (EIEC) NOT DETECTED NOT DETECTED Final   Cryptosporidium NOT DETECTED NOT DETECTED Final   Cyclospora cayetanensis NOT DETECTED NOT DETECTED Final   Entamoeba histolytica NOT DETECTED NOT DETECTED Final   Giardia lamblia NOT DETECTED NOT DETECTED Final   Adenovirus F40/41 NOT DETECTED NOT DETECTED Final   Astrovirus NOT DETECTED NOT DETECTED Final   Norovirus GI/GII NOT DETECTED NOT DETECTED Final   Rotavirus A NOT DETECTED NOT DETECTED Final   Sapovirus (I, II, IV, and V) NOT DETECTED NOT DETECTED Final    Comment: Performed at [facility], [place] [place], [place] [address]  Blood Culture (routine x 2)     Status: None   Collection Time: 10/24/20 10:30 PM   Specimen: BLOOD  Result Value Ref Range Status   Specimen Description   Final    BLOOD RIGHT ANTECUBITAL Performed at [facility], [place] [address] [place], [place] [address]    Special Requests   Final    BOTTLES DRAWN AEROBIC AND ANAEROBIC Blood Culture adequate volume Performed at [place] [address] [place], [place] [address]    Culture  Final    NO GROWTH 5 DAYS Performed at [facility], [place] [address], [place], [place] [address]    Report Status 10/30/2020 FINAL  Final  Blood Culture (routine x 2)     Status: None   Collection Time: 10/24/20 10:30 PM   Specimen: BLOOD  Result Value Ref Range Status   Specimen Description   Final    BLOOD BLOOD LEFT FOREARM Performed at [place] [address], [place], [place] [address]    Special Requests   Final     BOTTLES DRAWN AEROBIC AND ANAEROBIC Blood Culture results may not be optimal due to an inadequate volume of blood received in culture bottles Performed at [place] [address] [place], [place] [address]    Culture   Final    NO GROWTH 5 DAYS Performed at [facility], [place] [address] [place], [place] [address]    Report Status 10/30/2020 FINAL  Final  MRSA PCR Screening     Status: None   Collection Time: 10/25/20  6:04 PM   Specimen: Nasal Mucosa; Nasopharyngeal  Result Value Ref Range Status   MRSA by PCR NEGATIVE NEGATIVE Final    Comment:        The GeneXpert MRSA Assay (FDA approved for NASAL specimens only), is one component of a comprehensive MRSA colonization surveillance program. It is not intended to diagnose MRSA infection nor to guide or monitor treatment for MRSA infections. Performed at [facility], [place] [address], [place], [place] [address]   Urine culture     Status: None   Collection Time: 10/26/20 12:46 AM   Specimen: Urine, Random  Result Value Ref Range Status   Specimen Description   Final    URINE, RANDOM Performed at [place] [address], [place], [place] [address]    Special Requests   Final    NONE Performed at [facility], [place] [address], [place], [place] [address]    Culture   Final    NO GROWTH Performed at [facility], [place] [address] [place], [place] [address]    Report Status 10/27/2020 FINAL  Final     Labs: Basic Metabolic Panel: Recent Labs  Lab 10/26/20 0850 10/27/20 0346 10/28/20 0307 10/29/20 0425 10/30/20 0430 10/31/20 0248 11/01/20 0619  NA 152*   < > 141 143 145 141 145  K 4.6   < > 3.8 3.8 4.2 4.5 4.6  CL 111   < > 107 108 109 106 107  CO2 26   < > _0 GLUCOSE 128*   < > 168* 162* 151* 131* 143*  BUN 73*   < > 55* 46* 42* 41* 46*  CREATININE 3.55*   < > 1.73* 1.40* 1.20 1.16 1.21  CALCIUM 7.9*   <  > 8.3* 8.3* 8.7* 8.7* 8.8*  MG 3.0*  --   --   --  2.6* 2.3 2.2  PHOS 5.3*  --   --   --   --   --   --    < > = values in this interval not displayed.   Liver Function Tests: Recent Labs  Lab 10/28/20 0307 10/29/20 0425 10/30/20 0430 10/31/20 0248 11/01/20 0619  AST 68* 49* 38 32 25  ALT 38 _1 ALKPHOS 82 79 87 86 87  BILITOT 0.4 0.5 0.4 0.6 0.6  PROT  6.7 6.0* 6.1* 6.1* 6.1*  ALBUMIN 2.7* 2.3* 2.3* 2.2* 2.2*   No results for input(s): LIPASE, AMYLASE in the last 168 hours. No results for input(s): AMMONIA in the last 168 hours. CBC: Recent Labs  Lab 10/26/20 0850 10/27/20 0346 10/28/20 0307 10/29/20 0425 10/30/20 0430 10/31/20 0248 11/01/20 0619  WBC 16.2* 15.4* 20.5* 14.1* 14.8* 15.6* 10.0  NEUTROABS 14.7* 13.5* 18.6*  --  11.8*  --   --   HGB 12.3* 11.8* 10.9* 10.6* 11.3* 11.4* 11.6*  HCT 39.6 38.7* 35.1* 33.7* 36.0* 36.6* 36.9*  MCV 95.2 97.0 94.6 94.7 93.8 93.1 92.7  PLT 180 163 169 181 200 222 216   Cardiac Enzymes: No results for input(s): CKTOTAL, CKMB, CKMBINDEX, TROPONINI in the last 168 hours. BNP: BNP (last 3 results) Recent Labs    10/24/20 1652  BNP 144.3*    ProBNP (last 3 results) No results for input(s): PROBNP in the last 8760 hours.  CBG: No results for input(s): GLUCAP in the last 168 hours.     Signed:  Irine Seal MD.  [facility] 11/01/2020, 3:20 PM

## 2020-11-01 NOTE — TOC Transition Note (Signed)
Transition of Care [facility]) - CM/SW Discharge Note   Patient Details  Name: Austin Hartman MRN: [id number] Date of Birth: [birth year]  Transition of [facility]) CM/SW Contact:  Ida Rogue, LCSW Phone Number: 11/01/2020, 3:50 PM   Clinical Narrative:  Patient is returning to [facility].  D/C summary and FL2 posted on [place] [place] informed.  PTAR arranged.  Nursing, please call report to [phone]. TOC sign off.    Final next level of care: Home/Self Care Barriers to Discharge: No Barriers Identified   Patient Goals and CMS Choice        Discharge Placement                       Discharge Plan and Services                                     Social Determinants of Health (SDOH) Interventions     Readmission Risk Interventions No flowsheet data found.

## 2020-11-01 NOTE — Progress Notes (Signed)
  Speech Language Pathology Treatment: Dysphagia  Patient Details Name: Austin Hartman MRN: [id number] DOB: 10/08/1951 Today's Date: 11/01/2020 Time: 1031-1050 SLP Time Calculation (min) (ACUTE ONLY): 19 min  Assessment / Plan / Recommendation Clinical Impression  Pt sitting upright in bed, leaned toward left, right arm and leg edematous and pt reported discomfort. Elevated right arm, bilateral legs with pillows and moved name bracelet lower to decrease compression.   Pt continued to reports discomfort with legs - and SLP placed all for pain medicine.    Of note, pt appears with whitish coating on right lateral tongue - remained after SLP had pt brush his teeth and gums. Concerning for oral candidiasis.  Advised RN.  Pt able to manipulate water with swallowing but did not expectorate after dental brushing despite cues.  No indication of aspiration with water consumption.  SLP did not observe pt with anything besides water = requested he wait until see MD to determine if he has oral candidiasis that necessitates treatment.    Education pt to safest liquids to drink being water and to use applesauce with medications, etc for safety prn.    Mentation is likely baseline and pt is on room air.   He advised he coughs sometimes with intake because he eats too quickly.  Recommend continue current diet regimen.  No SLP follow up needed.  Thanks.    HPI  HPI: 70yo male admitted 10/24/20 with AMS, hypoxia (70%). 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 Per sister, Austin Hartman, eats anything and everything; does better when drinking from a straw.  Per note on board, pt is not to use straw - ever.      SLP Plan  All goals met       Recommendations  Diet recommendations: Dysphagia 3 (mechanical soft);Thin liquid Liquids provided via: Cup;No straw (per sign on the  board) Medication Administration: Other (Comment) (as tolerated) Supervision: Staff to assist with self feeding Compensations: Minimize environmental distractions;Other (Comment) (check for oral retention on right) Postural Changes and/or Swallow Maneuvers: Seated upright 90 degrees                Oral Care Recommendations: Oral care BID Follow up Recommendations: None SLP Visit Diagnosis: Dysphagia, oral phase (R13.11) Plan: All goals met       GO                Macario Golds 11/01/2020, 11:07 AM   Kathleen Lime, MS [facility] SLP Acute Rehab Services Office [phone] Pager [phone]

## 2020-11-01 NOTE — Progress Notes (Signed)
Physical Therapy Treatment Patient Details Name: Austin Hartman MRN: [id number] DOB: 1951-02-24 Today's Date: 11/01/2020    History of Present Illness Austin Hartman is a 70 y.o. male with PMH significant for TBI, CVA, chronic SDH, s/p craniectomy, epilepsy, and recent covid-19 diagnosis who lives in a long-term care facility.    PT Comments    Attempted supine to sit with max assist, however 1/2 way to upright position pt stated, "NO!" and indicated he wanted to return to supine. +2 total assist for scooting up in bed. Assisted pt with BLE strengthening exercises. No active ankle dorsiflexion noted on R. Pt inconsistent with following 1 step commands.   Follow Up Recommendations  SNF     Equipment Recommendations  None recommended by PT    Recommendations for Other Services       Precautions / Restrictions Precautions Precautions: Fall Precaution Comments: seizure, craniotomy (needs helmet) Restrictions Weight Bearing Restrictions: No    Mobility  Bed Mobility Overal bed mobility: Needs Assistance Bed Mobility: Rolling;Supine to Sit Rolling: +2 for safety/equipment;+2 for physical assistance;Max assist   Supine to sit: Total assist;+2 for physical assistance     General bed mobility comments: patient does reach for rails when rolling to both sides. Has more difficulty gripping with right hand onto rail; +2 total assist for scooting to [facility] (pt unable to follow commands to assist with scooting up with LLE in hook lying). Attempted supine to sit with +2 total assist to raise trunk and pivot hips, with trunk 1/2 way to upright position pt stated "NO" and indicated he wanted to return to supine. Assisted pt back to supine.  Transfers                    Ambulation/Gait                 Stairs             Wheelchair Mobility    Modified Rankin (Stroke Patients Only)       Balance                                             Cognition Arousal/Alertness: Awake/alert Behavior During Therapy: WFL for tasks assessed/performed Overall Cognitive Status: Difficult to assess                                 General Comments: Able to follow some commands      Exercises General Exercises - Lower Extremity Ankle Circles/Pumps: Both;Supine;PROM;AROM;15 reps (PROM on R, AROM on L) Quad Sets: AROM;Left;Other (comment);Supine (attempted on L x 3, pt with poor comprehension of instructions and unable to perform correctly) Heel Slides: AROM;20 reps;Left;Supine;AAROM;Right;10 reps (and x 10 AAROM on RLE)    General Comments        Pertinent Vitals/Pain Pain Assessment: No/denies pain Faces Pain Scale: No hurt    Home Living                      Prior Function            PT Goals (current goals can now be found in the care plan section) Acute Rehab PT Goals Patient Stated Goal: none stated PT Goal Formulation: Patient unable to participate in goal setting Time For Goal Achievement: 11/11/20 Potential to  Achieve Goals: Fair    Frequency    Min 2X/week      PT Plan Current plan remains appropriate    Co-evaluation              AM-PAC PT "6 Clicks" Mobility   Outcome Measure  Help needed turning from your back to your side while in a flat bed without using bedrails?: Total Help needed moving from lying on your back to sitting on the side of a flat bed without using bedrails?: Total Help needed moving to and from a bed to a chair (including a wheelchair)?: Total Help needed standing up from a chair using your arms (e.g., wheelchair or bedside chair)?: Total Help needed to walk in hospital room?: Total Help needed climbing 3-5 steps with a railing? : Total 6 Click Score: 6    End of Session   Activity Tolerance: Patient tolerated treatment well Patient left: in bed;with nursing/sitter in room Nurse Communication: Mobility status;Need for lift equipment PT Visit  Diagnosis: Other symptoms and signs involving the nervous system (G95.621)     Time: 1053-1110 PT Time Calculation (min) (ACUTE ONLY): 17 min  Charges:  $Therapeutic Activity: 8-22 mins                     Ralene Bathe Kistler PT 11/01/2020  Acute Rehabilitation Services Pager [phone] Office [phone]

## 2020-11-01 NOTE — [place] FL2 (Signed)
[place] MEDICAID FL2 LEVEL OF CARE SCREENING TOOL     IDENTIFICATION  Patient Name: Austin Hartman Birthdate: 03-06-1951 Sex: male Admission Date (Current Location): 10/24/2020  [facility] and [place] Number:  Producer, television/film/video and Address:  [facility],  [address] [place], [place] [address]      Provider Number: [id number]  Attending Physician Name and Address:  Rodolph Bong, MD  Relative Name and Phone Number:  Shariq, Puig (Sister)   [phone]    Current Level of Care: Hospital Recommended Level of Care: Skilled Nursing Facility Prior Approval Number:    Date Approved/Denied:   PASRR Number:    Discharge Plan: SNF ([facility])    Current Diagnoses: Patient Active Problem List   Diagnosis Date Noted  . Pressure injury of skin 10/26/2020  . Abdominal distention   . Acute respiratory failure with hypoxia (HCC)   . ARF (acute renal failure) (HCC)   . COVID-19   . Hypernatremia   . Severe sepsis (HCC) 10/24/2020  . On amiodarone therapy 01/21/2018  . Chronic atrial fibrillation (HCC) 06/06/2017  . High risk medication use 06/06/2017  . Subdural hematoma, chronic (HCC) 06/06/2017  . 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 Typical atrial flutter (HCC) 05/07/2016  . Chronic constipation 03/29/2016  . Altered mental state 03/29/2016  . Hypothermia 03/29/2016  . Epilepsy (HCC) 03/29/2016  . Abdominal pain, generalized 03/29/2016  . Hypotension, unspecified 03/29/2016  . Left hemiparesis (HCC) 03/29/2016  . At high risk for aspiration 03/29/2016  . DNR (do not resuscitate) 03/29/2016  . Leukocytosis 03/29/2016  . TBI (traumatic brain injury) (HCC)   . Dysphagia   . 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 Diarrhea   . Hypertensive heart  disease with heart failure (HCC)   . History of traumatic brain injury   . Sepsis (HCC)   . Hyperlipidemia 06/30/2015  . Hemiplegia (HCC) 12/13/2011  . Partial epilepsy with impairment of consciousness, intractable (HCC) 12/13/2011    Orientation RESPIRATION BLADDER Height & Weight     Self,Place  Normal External catheter Weight: (!) 147.1 kg Height:  6\' 4"  (193 cm)  BEHAVIORAL SYMPTOMS/MOOD NEUROLOGICAL BOWEL NUTRITION STATUS   (none) Convulsions/Seizures Incontinent Diet (see d/c summary)  AMBULATORY STATUS COMMUNICATION OF NEEDS Skin   Extensive Assist Verbally Other (Comment) (Pressure injury: Coccyx, medial      Wound: L Buttocks, Medial)                       Personal Care Assistance Level of Assistance  Bathing,Feeding,Dressing Bathing Assistance: Maximum assistance Feeding assistance: Limited assistance Dressing Assistance: Maximum assistance     Functional Limitations Info  Sight,Hearing,Speech Sight Info: Adequate Hearing Info: Adequate Speech Info: Adequate    SPECIAL CARE FACTORS FREQUENCY                       Contractures Contractures Info: Not present    Additional Factors Info  Allergies,Code Status Code Status Info: DNR Allergies Info: Acetaminophen, Azithromycin, Biaxin, Diltiazem, Verapamil           Current Medications (11/01/2020):  This is the current hospital active medication list Current Facility-Administered Medications  Medication Dose Route Frequency Provider Last Rate Last Admin  . (feeding supplement) PROSource Plus liquid 30 mL  30 mL Oral BID BM 12/30/2020  V, MD   30 mL at 11/01/20 1120  . albuterol (VENTOLIN HFA) 108 (90 Base) MCG/ACT inhaler 2 puff  2 puff Inhalation Q6H PRN Dahal, Binaya, MD      . amiodarone (PACERONE) tablet 200 mg  200 mg Oral Daily Hazeline Junker B, MD   200 mg at 11/01/20 1120  . aspirin EC tablet 81 mg  81 mg Oral Daily Tyrone Nine, MD   81 mg at 11/01/20 1119  . bethanechol (URECHOLINE)  tablet 10 mg  10 mg Oral TID Tyrone Nine, MD   10 mg at 11/01/20 1118  . chlorhexidine (PERIDEX) 0.12 % solution 15 mL  15 mL Mouth Rinse BID Dahal, Melina Schools, MD   15 mL at 11/01/20 1116  . Chlorhexidine Gluconate Cloth 2 % PADS 6 each  6 each Topical Daily Dahal, Melina Schools, MD   6 each at 11/01/20 1121  . chlorpheniramine-HYDROcodone (TUSSIONEX) 10-8 MG/5ML suspension 5 mL  5 mL Oral Q12H PRN Dahal, Binaya, MD      . cloBAZam (ONFI) tablet 40 mg  40 mg Oral Daily Tyrone Nine, MD   40 mg at 11/01/20 1116  . clopidogrel (PLAVIX) tablet 75 mg  75 mg Oral Daily Tyrone Nine, MD   75 mg at 11/01/20 1119  . digoxin (LANOXIN) 0.25 MG/ML injection 0.125 mg  0.125 mg Intravenous Daily Manuela Schwartz, NP   0.125 mg at 11/01/20 1128  . escitalopram (LEXAPRO) tablet 10 mg  10 mg Oral Daily Rodolph Bong, MD   10 mg at 11/01/20 1117  . feeding supplement (BOOST / RESOURCE BREEZE) liquid 1 Container  1 Container Oral BID BM Rodolph Bong, MD   1 Container at 11/01/20 1121  . finasteride (PROSCAR) tablet 5 mg  5 mg Oral Daily Hazeline Junker B, MD   5 mg at 11/01/20 1117  . gabapentin (NEURONTIN) capsule 300 mg  300 mg Oral QHS Tyrone Nine, MD   300 mg at 10/31/20 2118  . guaiFENesin tablet 400 mg  400 mg Oral BID Tyrone Nine, MD   400 mg at 11/01/20 1118  . guaiFENesin-dextromethorphan (ROBITUSSIN DM) 100-10 MG/5ML syrup 10 mL  10 mL Oral Q4H PRN Lorin Glass, MD   10 mL at 10/29/20 2100  . Ipratropium-Albuterol (COMBIVENT) respimat 2 puff  2 puff Inhalation BID Rodolph Bong, MD   2 puff at 11/01/20 0857  . lamoTRIgine (LAMICTAL) tablet 400 mg  400 mg Oral BID Tyrone Nine, MD   400 mg at 11/01/20 1117  . levETIRAcetam (KEPPRA) tablet 1,000 mg  1,000 mg Oral QHS Rodolph Bong, MD   1,000 mg at 10/31/20 2118  . levETIRAcetam (KEPPRA) tablet 1,250 mg  1,250 mg Oral Daily Rodolph Bong, MD   1,250 mg at 11/01/20 1118  . levothyroxine (SYNTHROID) tablet 112 mcg  112 mcg Oral Q0600  Rodolph Bong, MD   112 mcg at 11/01/20 2440  . loperamide (IMODIUM) capsule 2 mg  2 mg Oral PRN Rodolph Bong, MD   2 mg at 10/29/20 1019  . loratadine (CLARITIN) tablet 10 mg  10 mg Oral Daily Tyrone Nine, MD   10 mg at 11/01/20 1117  . MEDLINE mouth rinse  15 mL Mouth Rinse q12n4p Dahal, Binaya, MD   15 mL at 11/01/20 1121  . methylPREDNISolone sodium succinate (SOLU-MEDROL) 125 mg/2 mL injection 81.25 mg  81.25 mg Intravenous BID Rodolph Bong, MD   81.25 mg at  11/01/20 1114  . montelukast (SINGULAIR) tablet 10 mg  10 mg Oral QHS Tyrone Nine, MD   10 mg at 10/31/20 2118  . multivitamin with minerals tablet 1 tablet  1 tablet Oral Daily Rodolph Bong, MD   1 tablet at 11/01/20 1117  . sodium chloride flush (NS) 0.9 % injection 10-40 mL  10-40 mL Intracatheter Q12H Rodolph Bong, MD   10 mL at 11/01/20 1121  . sodium chloride flush (NS) 0.9 % injection 10-40 mL  10-40 mL Intracatheter PRN Rodolph Bong, MD      . sodium chloride flush (NS) 0.9 % injection 3 mL  3 mL Intravenous Q12H Hazeline Junker B, MD   3 mL at 11/01/20 1121  . tamsulosin (FLOMAX) capsule 0.8 mg  0.8 mg Oral QPC supper Tyrone Nine, MD   0.8 mg at 10/31/20 1749     Discharge Medications: Please see discharge summary for a list of discharge medications.  Relevant Imaging Results:  Relevant Lab Results:   Additional Information [id number].  Ida Rogue, LCSW

## 2020-11-01 NOTE — Progress Notes (Signed)
Called report to Newton Hamilton, [place] who will be taking over patient care once he arrives to [facility].

## 2020-11-01 NOTE — Progress Notes (Signed)
Called patient sister and HCPOA and informed her that patient will be discharged back to [facility].

## 2020-11-02 NOTE — Progress Notes (Signed)
Patient is being discharged to [facility] transported via [place] on [facility], general practice. IV removed from RAC. Helmet on patients head for transport and seizure precautions.

## 2020-11-07 ENCOUNTER — Emergency Department (HOSPITAL_COMMUNITY): Payer: Medicare Other

## 2020-11-07 ENCOUNTER — Other Ambulatory Visit: Payer: Self-pay

## 2020-11-07 ENCOUNTER — Encounter (HOSPITAL_COMMUNITY): Payer: Self-pay | Admitting: Emergency Medicine

## 2020-11-07 ENCOUNTER — Inpatient Hospital Stay (HOSPITAL_COMMUNITY)
Admission: EM | Admit: 2020-11-07 | Discharge: 2020-11-15 | DRG: 377 | Disposition: A | Discharge status: Skilled Nursing Facility | Payer: Medicare Other | Source: Skilled Nursing Facility | Attending: Internal Medicine | Admitting: Internal Medicine

## 2020-11-07 DIAGNOSIS — M7989 Other specified soft tissue disorders: Secondary | ICD-10-CM | POA: Diagnosis not present

## 2020-11-07 DIAGNOSIS — I959 Hypotension, unspecified: Secondary | ICD-10-CM | POA: Diagnosis not present

## 2020-11-07 DIAGNOSIS — K625 Hemorrhage of anus and rectum: Secondary | ICD-10-CM

## 2020-11-07 DIAGNOSIS — Z7952 Long term (current) use of systemic steroids: Secondary | ICD-10-CM

## 2020-11-07 DIAGNOSIS — Z7902 Long term (current) use of antithrombotics/antiplatelets: Secondary | ICD-10-CM

## 2020-11-07 DIAGNOSIS — Z79899 Other long term (current) drug therapy: Secondary | ICD-10-CM

## 2020-11-07 DIAGNOSIS — I130 Hypertensive heart and chronic kidney disease with heart failure and stage 1 through stage 4 chronic kidney disease, or unspecified chronic kidney disease: Secondary | ICD-10-CM | POA: Diagnosis present

## 2020-11-07 DIAGNOSIS — M79604 Pain in right leg: Secondary | ICD-10-CM | POA: Diagnosis not present

## 2020-11-07 DIAGNOSIS — G8194 Hemiplegia, unspecified affecting left nondominant side: Secondary | ICD-10-CM | POA: Diagnosis present

## 2020-11-07 DIAGNOSIS — N182 Chronic kidney disease, stage 2 (mild): Secondary | ICD-10-CM | POA: Diagnosis present

## 2020-11-07 DIAGNOSIS — Z8249 Family history of ischemic heart disease and other diseases of the circulatory system: Secondary | ICD-10-CM

## 2020-11-07 DIAGNOSIS — E870 Hyperosmolality and hypernatremia: Secondary | ICD-10-CM | POA: Diagnosis not present

## 2020-11-07 DIAGNOSIS — S069X9D Unspecified intracranial injury with loss of consciousness of unspecified duration, subsequent encounter: Secondary | ICD-10-CM

## 2020-11-07 DIAGNOSIS — Z87820 Personal history of traumatic brain injury: Secondary | ICD-10-CM | POA: Diagnosis not present

## 2020-11-07 DIAGNOSIS — I2510 Atherosclerotic heart disease of native coronary artery without angina pectoris: Secondary | ICD-10-CM | POA: Diagnosis present

## 2020-11-07 DIAGNOSIS — R471 Dysarthria and anarthria: Secondary | ICD-10-CM | POA: Diagnosis present

## 2020-11-07 DIAGNOSIS — Z9049 Acquired absence of other specified parts of digestive tract: Secondary | ICD-10-CM | POA: Diagnosis not present

## 2020-11-07 DIAGNOSIS — G40219 Localization-related (focal) (partial) symptomatic epilepsy and epileptic syndromes with complex partial seizures, intractable, without status epilepticus: Secondary | ICD-10-CM | POA: Diagnosis present

## 2020-11-07 DIAGNOSIS — K921 Melena: Principal | ICD-10-CM | POA: Diagnosis present

## 2020-11-07 DIAGNOSIS — R578 Other shock: Secondary | ICD-10-CM

## 2020-11-07 DIAGNOSIS — Z66 Do not resuscitate: Secondary | ICD-10-CM | POA: Diagnosis present

## 2020-11-07 DIAGNOSIS — Z881 Allergy status to other antibiotic agents status: Secondary | ICD-10-CM

## 2020-11-07 DIAGNOSIS — E861 Hypovolemia: Secondary | ICD-10-CM | POA: Diagnosis present

## 2020-11-07 DIAGNOSIS — G40119 Localization-related (focal) (partial) symptomatic epilepsy and epileptic syndromes with simple partial seizures, intractable, without status epilepticus: Secondary | ICD-10-CM | POA: Diagnosis present

## 2020-11-07 DIAGNOSIS — D62 Acute posthemorrhagic anemia: Secondary | ICD-10-CM | POA: Diagnosis present

## 2020-11-07 DIAGNOSIS — E785 Hyperlipidemia, unspecified: Secondary | ICD-10-CM | POA: Diagnosis present

## 2020-11-07 DIAGNOSIS — S069X9A Unspecified intracranial injury with loss of consciousness of unspecified duration, initial encounter: Secondary | ICD-10-CM | POA: Diagnosis present

## 2020-11-07 DIAGNOSIS — I482 Chronic atrial fibrillation, unspecified: Secondary | ICD-10-CM

## 2020-11-07 DIAGNOSIS — K922 Gastrointestinal hemorrhage, unspecified: Secondary | ICD-10-CM | POA: Diagnosis present

## 2020-11-07 DIAGNOSIS — I5032 Chronic diastolic (congestive) heart failure: Secondary | ICD-10-CM | POA: Diagnosis present

## 2020-11-07 DIAGNOSIS — S069XAA Unspecified intracranial injury with loss of consciousness status unknown, initial encounter: Secondary | ICD-10-CM | POA: Diagnosis present

## 2020-11-07 DIAGNOSIS — Z888 Allergy status to other drugs, medicaments and biological substances status: Secondary | ICD-10-CM

## 2020-11-07 DIAGNOSIS — K559 Vascular disorder of intestine, unspecified: Secondary | ICD-10-CM | POA: Diagnosis present

## 2020-11-07 DIAGNOSIS — K633 Ulcer of intestine: Secondary | ICD-10-CM | POA: Diagnosis present

## 2020-11-07 DIAGNOSIS — I4892 Unspecified atrial flutter: Secondary | ICD-10-CM | POA: Diagnosis present

## 2020-11-07 DIAGNOSIS — U071 COVID-19: Secondary | ICD-10-CM

## 2020-11-07 DIAGNOSIS — I25119 Atherosclerotic heart disease of native coronary artery with unspecified angina pectoris: Secondary | ICD-10-CM | POA: Diagnosis present

## 2020-11-07 DIAGNOSIS — Z955 Presence of coronary angioplasty implant and graft: Secondary | ICD-10-CM

## 2020-11-07 DIAGNOSIS — Z7982 Long term (current) use of aspirin: Secondary | ICD-10-CM

## 2020-11-07 DIAGNOSIS — I483 Typical atrial flutter: Secondary | ICD-10-CM | POA: Diagnosis present

## 2020-11-07 DIAGNOSIS — E876 Hypokalemia: Secondary | ICD-10-CM | POA: Diagnosis not present

## 2020-11-07 LAB — CBC WITH DIFFERENTIAL/PLATELET
Abs Immature Granulocytes: 0.04 10*3/uL (ref 0.00–0.07)
Basophils Absolute: 0 10*3/uL (ref 0.0–0.1)
Basophils Relative: 0 %
Eosinophils Absolute: 0.1 10*3/uL (ref 0.0–0.5)
Eosinophils Relative: 1 %
HCT: 37.1 % — ABNORMAL LOW (ref 39.0–52.0)
Hemoglobin: 11.6 g/dL — ABNORMAL LOW (ref 13.0–17.0)
Immature Granulocytes: 1 %
Lymphocytes Relative: 6 %
Lymphs Abs: 0.5 10*3/uL — ABNORMAL LOW (ref 0.7–4.0)
MCH: 29.4 pg (ref 26.0–34.0)
MCHC: 31.3 g/dL (ref 30.0–36.0)
MCV: 93.9 fL (ref 80.0–100.0)
Monocytes Absolute: 0.8 10*3/uL (ref 0.1–1.0)
Monocytes Relative: 9 %
Neutro Abs: 7 10*3/uL (ref 1.7–7.7)
Neutrophils Relative %: 83 %
Platelets: 266 10*3/uL (ref 150–400)
RBC: 3.95 MIL/uL — ABNORMAL LOW (ref 4.22–5.81)
RDW: 16.2 % — ABNORMAL HIGH (ref 11.5–15.5)
WBC: 8.3 10*3/uL (ref 4.0–10.5)
nRBC: 0 % (ref 0.0–0.2)

## 2020-11-07 LAB — POC OCCULT BLOOD, ED: Fecal Occult Bld: POSITIVE — AB

## 2020-11-07 LAB — COMPREHENSIVE METABOLIC PANEL
ALT: 21 U/L (ref 0–44)
AST: 30 U/L (ref 15–41)
Albumin: 2.6 g/dL — ABNORMAL LOW (ref 3.5–5.0)
Alkaline Phosphatase: 86 U/L (ref 38–126)
Anion gap: 11 (ref 5–15)
BUN: 34 mg/dL — ABNORMAL HIGH (ref 8–23)
CO2: 37 mmol/L — ABNORMAL HIGH (ref 22–32)
Calcium: 8.4 mg/dL — ABNORMAL LOW (ref 8.9–10.3)
Chloride: 95 mmol/L — ABNORMAL LOW (ref 98–111)
Creatinine, Ser: 1.41 mg/dL — ABNORMAL HIGH (ref 0.61–1.24)
GFR, Estimated: 54 mL/min — ABNORMAL LOW (ref >60)
Glucose, Bld: 142 mg/dL — ABNORMAL HIGH (ref 70–99)
Potassium: 4.5 mmol/L (ref 3.5–5.1)
Sodium: 143 mmol/L (ref 135–145)
Total Bilirubin: 1.5 mg/dL — ABNORMAL HIGH (ref 0.3–1.2)
Total Protein: 6.4 g/dL — ABNORMAL LOW (ref 6.5–8.1)

## 2020-11-07 LAB — PROTIME-INR
INR: 1 (ref 0.8–1.2)
Prothrombin Time: 13.1 seconds (ref 11.4–15.2)

## 2020-11-07 LAB — PREPARE RBC (CROSSMATCH)

## 2020-11-07 LAB — ABO/RH: ABO/RH(D): A POS

## 2020-11-07 MED ORDER — LAMOTRIGINE 100 MG PO TABS
400.0000 mg | ORAL_TABLET | Freq: Two times a day (BID) | ORAL | Status: DC
  Administered 2020-11-07 – 2020-11-15 (×15): 400 mg via ORAL
  Filled 2020-11-07 (×14): qty 4

## 2020-11-07 MED ORDER — IOHEXOL 350 MG/ML SOLN
100.0000 mL | Freq: Once | INTRAVENOUS | Status: AC | PRN
  Administered 2020-11-07: 100 mL via INTRAVENOUS

## 2020-11-07 MED ORDER — LEVOTHYROXINE SODIUM 112 MCG PO TABS
112.0000 ug | ORAL_TABLET | Freq: Every day | ORAL | Status: DC
  Administered 2020-11-09 – 2020-11-15 (×7): 112 ug via ORAL
  Filled 2020-11-07 (×8): qty 1

## 2020-11-07 MED ORDER — AMIODARONE HCL 200 MG PO TABS
200.0000 mg | ORAL_TABLET | Freq: Every day | ORAL | Status: DC
  Administered 2020-11-09 – 2020-11-15 (×7): 200 mg via ORAL
  Filled 2020-11-07 (×8): qty 1

## 2020-11-07 MED ORDER — SODIUM CHLORIDE 0.9 % IV SOLN: Freq: Once | INTRAVENOUS | Status: AC

## 2020-11-07 MED ORDER — LEVETIRACETAM 500 MG PO TABS: 1000.0000 mg | ORAL_TABLET | Freq: Every day | ORAL | Status: DC

## 2020-11-07 MED ORDER — SODIUM CHLORIDE 0.9 % IV BOLUS
1000.0000 mL | Freq: Once | INTRAVENOUS | Status: AC
  Administered 2020-11-07: 1000 mL via INTRAVENOUS

## 2020-11-07 MED ORDER — SODIUM CHLORIDE 0.9% IV SOLUTION: Freq: Once | INTRAVENOUS | Status: DC

## 2020-11-07 MED ORDER — ONDANSETRON HCL 4 MG/2ML IJ SOLN: 4.0000 mg | Freq: Four times a day (QID) | INTRAMUSCULAR | Status: DC | PRN

## 2020-11-07 MED ORDER — PANTOPRAZOLE SODIUM 40 MG IV SOLR
40.0000 mg | INTRAVENOUS | Status: DC
  Administered 2020-11-07: 40 mg via INTRAVENOUS
  Filled 2020-11-07: qty 40

## 2020-11-07 MED ORDER — ONDANSETRON HCL 4 MG PO TABS: 4.0000 mg | ORAL_TABLET | Freq: Four times a day (QID) | ORAL | Status: DC | PRN

## 2020-11-07 MED ORDER — DIGOXIN 125 MCG PO TABS
125.0000 ug | ORAL_TABLET | Freq: Every day | ORAL | Status: DC
  Administered 2020-11-09 – 2020-11-15 (×7): 125 ug via ORAL
  Filled 2020-11-07 (×8): qty 1

## 2020-11-07 MED ORDER — LEVETIRACETAM 500 MG PO TABS
1250.0000 mg | ORAL_TABLET | Freq: Every day | ORAL | Status: DC
  Administered 2020-11-09 – 2020-11-15 (×7): 1250 mg via ORAL
  Filled 2020-11-07 (×7): qty 1

## 2020-11-07 MED ORDER — IPRATROPIUM-ALBUTEROL 20-100 MCG/ACT IN AERS
2.0000 | INHALATION_SPRAY | Freq: Two times a day (BID) | RESPIRATORY_TRACT | Status: DC
  Administered 2020-11-07 – 2020-11-15 (×14): 2 via RESPIRATORY_TRACT
  Filled 2020-11-07: qty 4

## 2020-11-07 MED ORDER — LEVOTHYROXINE SODIUM 112 MCG PO CAPS: 112.0000 ug | ORAL_CAPSULE | Freq: Every day | ORAL | Status: DC

## 2020-11-07 MED ORDER — ATORVASTATIN CALCIUM 20 MG PO TABS
20.0000 mg | ORAL_TABLET | Freq: Every day | ORAL | Status: DC
  Administered 2020-11-09 – 2020-11-15 (×7): 20 mg via ORAL
  Filled 2020-11-07 (×5): qty 2
  Filled 2020-11-07 (×2): qty 1

## 2020-11-07 MED ORDER — CLOBAZAM 10 MG PO TABS
40.0000 mg | ORAL_TABLET | Freq: Every day | ORAL | Status: DC
  Administered 2020-11-10 – 2020-11-15 (×6): 40 mg via ORAL
  Filled 2020-11-07 (×6): qty 4

## 2020-11-07 MED ORDER — LEVETIRACETAM 250 MG PO TABS: 250.0000 mg | ORAL_TABLET | Freq: Every day | ORAL | Status: DC

## 2020-11-07 MED ORDER — SODIUM CHLORIDE 0.9 % IV SOLN: INTRAVENOUS | Status: AC

## 2020-11-07 MED ORDER — LEVETIRACETAM 500 MG PO TABS: 1000.0000 mg | ORAL_TABLET | Freq: Two times a day (BID) | ORAL | Status: DC

## 2020-11-07 MED ORDER — LEVETIRACETAM 500 MG PO TABS
1000.0000 mg | ORAL_TABLET | Freq: Every day | ORAL | Status: DC
  Administered 2020-11-07 – 2020-11-14 (×8): 1000 mg via ORAL
  Filled 2020-11-07 (×8): qty 2

## 2020-11-07 NOTE — ED Notes (Signed)
Pt had a large bowel movement of blood and large clot. MD Schlossman made aware.

## 2020-11-07 NOTE — ED Triage Notes (Addendum)
Patient BIBA from [facility] after staff noticed blood in diaper around 1300 today. Patient tested COVID+ on 1/03.  Hx TBI and HTN.  BP 124/82 P 106 SpO2 93% RA

## 2020-11-07 NOTE — H&P (Addendum)
History and Physical    Austin GourdMichael Pro JXB:[id number] DOB: Dec 09, 1950 DOA: 11/07/2020  PCP: Galvin ProfferHague, Imran P, MD   Patient coming from: SNF   Chief Complaint: Rectal bleeding   HPI: Austin Hartman is a 70 y.o. 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  The patient was discharged from hospital on 11/01/2020 on room air, was reportedly doing fairly well at the nursing facility until he was noted to have blood in his diaper this afternoon.  Patient does not have any complaints in the ED but his family notes that he often has difficulty verbalizing when he does have pain or discomfort.  ED Course: Upon arrival to the ED, patient is found to be afebrile, saturating mid 90s on room air, tachycardic in the 110s, and with blood pressure as low as 80/60.  Chemistry panel is notable for BUN of 34 and creatinine 1.41.  CBC notable for slight and stable normocytic anemia.  Fecal occult blood test is positive.  He has had large volume of maroon blood and clots per rectum in ED.  CTA abdomen and pelvis is negative for acute nonvascular findings and negative for AAA, dissection, or active arterial hemorrhage.  Gastroenterology was consulted by the ED physician and the patient was given a liter of saline and 1 unit RBC.  Review of Systems:  Unable to complete ROS secondary to patient's clinical condition.  Past Medical History:  Diagnosis Date  . Abdominal pain, generalized 03/29/2016  . Altered mental state 03/29/2016  . At high risk for aspiration 03/29/2016  . CHF (congestive heart failure) (HCC)   . Chronic constipation 03/29/2016  . Coronary artery disease   . Diarrhea   . DNR (do not  resuscitate) 03/29/2016  . Dysphagia   . Epilepsy (HCC) 03/29/2016  . Essential hypertension   . Hemiplegia (HCC) 12/13/2011  . History of traumatic brain injury   . Hyperlipidemia 06/30/2015  . Hypertension   . Hypotension, unspecified 03/29/2016  . Hypothermia 03/29/2016  . Left hemiparesis (HCC) 03/29/2016  . Leukocytosis 03/29/2016  . 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 Partial epilepsy with impairment of consciousness, intractable (HCC) 12/13/2011  . Sepsis (HCC)   . Stroke (HCC)   . TBI (traumatic brain injury) (HCC)   . Typical atrial flutter (HCC) 05/07/2016    Past Surgical History:  Procedure Laterality Date  . BRAIN SURGERY    . CARDIAC SURGERY    . CHOLECYSTECTOMY    . CORONARY ANGIOPLASTY WITH STENT PLACEMENT    . HERNIA REPAIR      Social History:   reports that he has never smoked. He has never used smokeless tobacco. He reports that he does not drink alcohol and does not use drugs.  Allergies  Allergen Reactions  . Acetaminophen Other (See Comments)    unknown  . Azithromycin Other (See Comments)    unknown  . Biaxin [Clarithromycin] Other (See Comments)    unknown  . Diltiazem Other (See Comments)    unknown  . Other Other (See Comments)    Darvocet-N - unknown  . Verapamil Other (See Comments)    unknown    Family History  Problem  Relation Age of Onset  . Hypertension Mother   . Lung cancer Mother   . Heart attack Father   . Hypertension Father   . Hyperlipidemia Father      Prior to Admission medications   Medication Sig Start Date End Date Taking? Authorizing Provider  acetaminophen (TYLENOL) 325 MG tablet Take 650 mg by mouth every 6 (six) hours as needed for moderate pain.   Yes [provider]  amiodarone (PACERONE) 200 MG tablet Take 200 mg by mouth daily.    Yes [provider]  ammonium lactate  (AMLACTIN) 12 % cream Apply 1 g topically at bedtime. Both legs/feet   Yes [provider]  aspirin EC 81 MG tablet Take 81 mg by mouth daily.   Yes [provider]  atorvastatin (LIPITOR) 20 MG tablet Take 20 mg by mouth daily.   Yes [provider]  bethanechol (URECHOLINE) 10 MG tablet Take 10 mg by mouth 3 (three) times daily.    Yes [provider]  bisacodyl (DULCOLAX) 10 MG suppository Place 10 mg rectally 2 (two) times daily as needed for moderate constipation.   Yes [provider]  cloBAZam (ONFI) 10 MG tablet Take 40 mg by mouth daily.   Yes [provider]  clopidogrel (PLAVIX) 75 MG tablet Take 75 mg by mouth daily.   Yes [provider]  Cranberry 450 MG CAPS Take 450 mg by mouth daily.   Yes [provider]  digoxin (LANOXIN) 0.125 MG tablet Take 125 mcg daily by mouth.    Yes [provider]  escitalopram (LEXAPRO) 10 MG tablet Take 10 mg by mouth daily.   Yes [provider]  finasteride (PROSCAR) 5 MG tablet Take 5 mg by mouth daily.   Yes [provider]  gabapentin (NEURONTIN) 300 MG capsule Take 300 mg by mouth at bedtime.   Yes [provider]  guaifenesin (HUMIBID E) 400 MG TABS tablet Take 400 mg by mouth 2 (two) times daily.   Yes [provider]  Ipratropium-Albuterol (COMBIVENT) 20-100 MCG/ACT AERS respimat Inhale 2 puffs into the lungs 2 (two) times daily. Take 2 times daily x 5 days then every 6 hours as needed. Patient taking differently: Inhale 2 puffs into the lungs in the morning and at bedtime. 11/01/20  Yes Rodolph Bong, MD  lamoTRIgine (LAMICTAL) 200 MG tablet Take 400 mg by mouth 2 (two) times daily.   Yes [provider]  levETIRAcetam (KEPPRA) 1000 MG tablet Take 1,000 mg by mouth 2 (two) times daily. Take along with 250 mg, total 1250 mg at 9 am.   Yes [provider]  levETIRAcetam (KEPPRA) 1000 MG tablet Take 1,000 mg by  mouth at bedtime.   Yes [provider]  levETIRAcetam (KEPPRA) 250 MG tablet Take 250 mg by mouth daily. Takes along with 1000 mg , total 1250 mg. At 9 am.   Yes [provider]  Levothyroxine Sodium 112 MCG CAPS Take 112 mcg by mouth daily before breakfast.    Yes [provider]  linaclotide (LINZESS) 290 MCG CAPS capsule Take 290 mcg by mouth daily.   Yes [provider]  loratadine (CLARITIN) 10 MG tablet Take 10 mg by mouth daily.   Yes [provider]  Magnesium Hydroxide (MILK OF MAGNESIA PO) Take 30 mLs by mouth daily as needed (constipation).   Yes [provider]  meloxicam (MOBIC) 15 MG tablet Take 15 mg by mouth daily.   Yes  [provider]  Menthol, Topical Analgesic, (BIOFREEZE) 4 % GEL Apply 1 application topically 2 (two) times daily.   Yes [provider]  metoprolol tartrate (LOPRESSOR) 25 MG tablet Take 12.5 mg by mouth 3 (three) times daily.    Yes [provider]  montelukast (SINGULAIR) 10 MG tablet Take 10 mg by mouth at bedtime.   Yes [provider]  Multiple Vitamins-Minerals (MULTIVITAMIN PO) Take 1 tablet by mouth daily.   Yes [provider]  pantoprazole (PROTONIX) 20 MG tablet Take 20 mg by mouth daily.   Yes [provider]  polyethylene glycol (MIRALAX / GLYCOLAX) packet Take 17 g by mouth daily.   Yes [provider]  predniSONE (DELTASONE) 20 MG tablet Take 3 tablets (60 mg total) by mouth daily with breakfast for 3 days, THEN 2 tablets (40 mg total) daily with breakfast for 3 days, THEN 1 tablet (20 mg total) daily with breakfast for 3 days. 11/01/20 11/10/20 Yes Rodolph Bong, MD  sennosides-docusate sodium (SENOKOT-S) 8.6-50 MG tablet Take 2 tablets by mouth 2 (two) times daily.   Yes [provider]  sodium fluoride (PREVIDENT 5000 PLUS) 1.1 % CREA dental cream Place 1 application onto teeth 2 (two) times daily.   Yes [provider]  tamsulosin (FLOMAX) 0.4 MG CAPS capsule Take 0.8 mg by mouth daily after supper.   Yes [provider]  torsemide (DEMADEX) 20 MG tablet Take 2 tablets (40 mg total) by mouth 2 (two) times daily. 11/04/20  Yes Rodolph Bong, MD    Physical Exam: Vitals:   11/07/20 2030 11/07/20 2045 11/07/20 2100 11/07/20 2115  BP: 94/84 103/80 95/78 108/86  Pulse: (!) 110 (!) 110 (!) 113 (!) 113  Resp: 14 12 13 16   Temp:      TempSrc:      SpO2: 99% 100% 98% 97%     Constitutional: NAD, calm  Eyes: PERTLA, lids and conjunctivae normal ENMT: Mucous membranes are moist. Posterior pharynx clear of any exudate or lesions.   Neck: normal, supple, no masses, no thyromegaly Respiratory: no wheezing, no crackles. No accessory muscle use.  Cardiovascular: Rate ~110-120 and regular. Pedal edema. Abdomen: No distension, no tenderness, soft. Bowel sounds active.  Musculoskeletal: no clubbing / cyanosis. No joint deformity upper and lower extremities.   Skin: no significant rashes, lesions, ulcers. Warm, dry, well-perfused. Neurologic: Dysarthria, aphasia, right hemiparesis.   Psychiatric: Alert, make eye-contract and attempts to answer question. Calm.    Labs and Imaging on Admission: I have personally reviewed following labs and imaging studies  CBC: Recent Labs  Lab 11/01/20 0619 11/07/20 1508  WBC 10.0 8.3  NEUTROABS  --  7.0  HGB 11.6* 11.6*  HCT 36.9* 37.1*  MCV 92.7 93.9  PLT 216 266   Basic Metabolic Panel: Recent Labs  Lab 11/01/20 0619 11/07/20 1508  NA 145 143  K 4.6 4.5  CL 107 95*  CO2 30 37*  GLUCOSE 143* 142*  BUN 46* 34*  CREATININE 1.21 1.41*  CALCIUM 8.8* 8.4*  MG 2.2  --    GFR: Estimated Creatinine Clearance: 78.7 mL/min (A) (by C-G formula based on SCr of 1.41 mg/dL (H)). Liver Function Tests: Recent Labs  Lab 11/01/20 0619 11/07/20 1508  AST 25 30  ALT 22 21  ALKPHOS 87 86  BILITOT 0.6 1.5*  PROT 6.1* 6.4*  ALBUMIN 2.2* 2.6*    No results for input(s): LIPASE, AMYLASE in the last 168 hours. No results  for input(s): AMMONIA in the last 168 hours. Coagulation Profile: Recent Labs  Lab 11/07/20 1508  INR 1.0   Cardiac Enzymes: No results for input(s): CKTOTAL, CKMB, CKMBINDEX, TROPONINI in the last 168 hours. BNP (last 3 results) No results for input(s): PROBNP in the last 8760 hours. HbA1C: No results for input(s): HGBA1C in the last 72 hours. CBG: No results for input(s): GLUCAP in the last 168 hours. Lipid Profile: No results for input(s): CHOL, HDL, LDLCALC, TRIG, CHOLHDL, LDLDIRECT in the last 72 hours. Thyroid Function Tests: No results for input(s): TSH, T4TOTAL, FREET4, T3FREE, THYROIDAB in the last 72 hours. Anemia Panel: No results for input(s): VITAMINB12, FOLATE, FERRITIN, TIBC, IRON, RETICCTPCT in the last 72 hours. Urine analysis:    Component Value Date/Time   COLORURINE AMBER (A) 10/26/2020 0046   APPEARANCEUR HAZY (A) 10/26/2020 0046   LABSPEC 1.019 10/26/2020 0046   PHURINE 5.0 10/26/2020 0046   GLUCOSEU NEGATIVE 10/26/2020 0046   HGBUR SMALL (A) 10/26/2020 0046   BILIRUBINUR NEGATIVE 10/26/2020 0046   KETONESUR NEGATIVE 10/26/2020 0046   PROTEINUR 30 (A) 10/26/2020 0046   NITRITE NEGATIVE 10/26/2020 0046   LEUKOCYTESUR NEGATIVE 10/26/2020 0046   Sepsis Labs: @LABRCNTIP (procalcitonin:4,lacticidven:4) )No results found for this or any previous visit (from the past 240 hour(s)).   Radiological Exams on Admission: CT Angio Abd/Pel W and/or Wo Contrast  Result Date: 11/07/2020 CLINICAL DATA:  Bloody stools, COVID-19 positivity EXAM: CTA ABDOMEN AND PELVIS WITHOUT AND WITH CONTRAST TECHNIQUE: Multidetector CT imaging of the abdomen and pelvis was performed using the standard protocol during bolus administration of intravenous contrast. Multiplanar reconstructed images and MIPs were obtained and reviewed to evaluate the vascular anatomy. CONTRAST:  11/09/2020 OMNIPAQUE IOHEXOL 350 MG/ML  SOLN COMPARISON:  03/30/2016 FINDINGS: VASCULAR Aorta: Abdominal aorta demonstrates atherosclerotic calcifications without aneurysmal dilatation. No dissection is seen. Celiac: Patent without evidence of aneurysm, dissection, vasculitis or significant stenosis. SMA: Patent without evidence of aneurysm, dissection, vasculitis or significant stenosis. 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 Bilateral atherosclerotic calcifications are seen with mild stenoses in the origins of the main renal arteries. IMA: Patent Iliacs: Atherosclerotic calcifications are noted without aneurysmal dilatation or dissection. Outflow in the upper thighs is within normal limits. Veins: No specific venous abnormality is noted. Review of the MIP images confirms the above findings. NON-VASCULAR Lower chest: No acute abnormality. Hepatobiliary: No focal liver abnormality is seen. Status post cholecystectomy. No biliary dilatation. Pancreas: Unremarkable. No pancreatic ductal dilatation or surrounding inflammatory changes. Spleen: Normal in size without focal abnormality. Adrenals/Urinary Tract: Adrenal glands are within normal limits. Kidneys show no renal calculi or obstructive changes. Normal enhancement pattern is noted bilaterally. Bladder is well distended. Stomach/Bowel: The appendix is not well visualized although no inflammatory changes to suggest appendicitis are seen. No obstructive or inflammatory changes of the colon are noted. The sigmoid is significantly redundant. No findings of acute GI hemorrhage are seen. Small bowel and stomach are within normal limits. Lymphatic: No significant vascular findings are present. No enlarged abdominal or pelvic lymph nodes. Reproductive: Prostate is unremarkable. Other: No abdominal wall hernia or abnormality. No abdominopelvic ascites. Musculoskeletal: Degenerative changes of lumbar spine are noted. Stable sclerotic focus is noted in the L5 vertebral body on the right. IMPRESSION:  VASCULAR No abdominal aortic aneurysm or dissection is noted. No findings to suggest active arterial hemorrhage are noted within the GI tract. Scattered atherosclerotic disease as described. NON-VASCULAR No acute abnormality in the abdomen and pelvis. Electronically Signed   By: 05/30/2016  Lukens M.D.   On: 11/07/2020 19:44    Assessment/Plan   1. Acute GI bleeding; symptomatic anemia  - Presents from SNF with rectal bleeding, has passed large volume maroon blood and clots in ED  - SBP 80/60 and HR 110, improved with IVF and 1 units RBC  - Initial Hgb was 11.6, similar to priors  - Likely lower GIB, no vomiting or abd pain, BUN lower than recent priors  - GI consulted by ED physician  - Continue IVF resuscitation, serial H&H, transfuse as needed, follow-up GI recommendations    ADDENDUM: Patient continues to bleed, notified that BP dropped to 56/44. Patient pale but alert. Bolusing 1 liter NS with pressure bag, called blood bank for 2 units RBC emergently. Appreciate Dr. Marina GoodellPerry of GI discussing case with me; he suspects diverticular bleed and recommends repeat CTA (in addition to aggressive resuscitation) in hope of findings a target for embolization.   Patient's sister was updated, confirms that patient would not want CPR or intubation but agrees with aggressive IVF and transfusions to try to get him through this.   ADDENDUM 2: Radiologist concerned that repeat CTA will not identify source as patient appeared to be actively bleeding during the first CTA, and is also concerned about additional contrast in renal insufficiency but recommends nuc med bleeding scan instead.    2. TBI; seizure disorder  - Hx of TBI at age 70, has right-sided weakness and often difficulty to understand his speech per family  - Continue Keppra, Lamictal, Onfi, supportive care    3. Atrial fibrillation/flutter  - Not anticoagulated due to hx of SDH  - Continue amiodarone and digoxin   4. CAD - No anginal complaints   - Hold ASA and Plavix in light of life-threatening bleed    5. Chronic diastolic CHF  - Hypovolemic on admission in setting of acute blood-loss  - Hold diuretics, continue IVF resuscitation, monitor weight and I/Os   6. Recent COVID-19 infection  - Admitted with breakthrough COVID-19 infection 1/3, required 10 Lpm supplemental O2, was treated with steroids and baricitinib, discharged 1/11 on rm air  - No tachypnea or hypoxia in ED, continue isolation through 11/14/20    7. CKD II  - SCr is 1.41 on admission, up from baseline of ~1.2 though BUN is lower now  - Renally-dose medications, monitor    DVT prophylaxis: SCDs  Code Status: DNR  Family Communication: Discussed with Tim LairKay Monier (patient's sister and medical decision-maker)  Disposition Plan:  Patient is from: SNF  Anticipated d/c is to: SNF  Anticipated d/c date is: 11/10/20 Patient currently: Pending GI consultation, serial H&H  Consults called: GI  Admission status: Inpatient    Briscoe Deutscherimothy S Dalayla Aldredge, MD [facility]  11/07/2020, 9:25 PM

## 2020-11-07 NOTE — ED Notes (Signed)
Pt had another large bowel movement of blood and large clot. MD Schlossman made aware.

## 2020-11-07 NOTE — ED Provider Notes (Signed)
[facility]-EMERGENCY DEPT Provider Note   CSN: [id number] Arrival date & time: 11/07/20  1417     History Chief Complaint  Patient presents with  . Rectal Bleeding  . COVID +    Austin Hartman is a 70 y.o. male.  HPI      70yo male with history of COVID 19 infection admission 1/3-1/11 with severe sepsis and AKI, CAD, htn, epilepsy, atrial fibrillation not on anticoagulation, history of subdural hematoma, left hemiparesis who presents with concern for rectal bleeding from [facility].  Denies chest pain, dyspnea, abdominal pain.  Acknowledges he is here for bleeding. Denies lightheadedness.  He is oriented to location but not time--facility reports he has been off since returning from hospital with COVID.  1PM today had episode of bleeding. Brief was soaked with blood, blood clots. Notified provider and had him sent.   Past Medical History:  Diagnosis Date  . Abdominal pain, generalized 03/29/2016  . Altered mental state 03/29/2016  . At high risk for aspiration 03/29/2016  . CHF (congestive heart failure) (HCC)   . Chronic constipation 03/29/2016  . Coronary artery disease   . Diarrhea   . DNR (do not resuscitate) 03/29/2016  . Dysphagia   . Epilepsy (HCC) 03/29/2016  . Essential hypertension   . Hemiplegia (HCC) 12/13/2011  . History of traumatic brain injury   . Hyperlipidemia 06/30/2015  . Hypertension   . Hypotension, unspecified 03/29/2016  . Hypothermia 03/29/2016  . Left hemiparesis (HCC) 03/29/2016  . Leukocytosis 03/29/2016  . 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 Partial epilepsy with impairment of consciousness, intractable (HCC) 12/13/2011  . Sepsis (HCC)   . Stroke (HCC)   . TBI (traumatic brain injury) (HCC)   . Typical atrial flutter (HCC) 05/07/2016    Patient Active Problem List   Diagnosis Date Noted   . Hemorrhagic shock (HCC) 11/08/2020  . Acute GI bleeding 11/07/2020  . 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 Chronic diastolic CHF (congestive heart failure) (HCC) 11/07/2020  . Pressure injury of skin 10/26/2020  . Abdominal distention   . Acute respiratory failure with hypoxia (HCC)   . ARF (acute renal failure) (HCC)   . COVID-19   . On amiodarone therapy 01/21/2018  . Chronic atrial fibrillation (HCC) 06/06/2017  . High risk medication use 06/06/2017  . Subdural hematoma, chronic (HCC) 06/06/2017  . 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 Typical atrial flutter (HCC) 05/07/2016  . Chronic constipation 03/29/2016  . Altered mental state 03/29/2016  . Epilepsy (HCC) 03/29/2016  . Abdominal pain, generalized 03/29/2016  . Hypotension, unspecified 03/29/2016  . Left hemiparesis (HCC) 03/29/2016  . At high risk for aspiration 03/29/2016  . DNR (do not resuscitate) 03/29/2016  . TBI (traumatic brain injury) (HCC)   . Dysphagia   . 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 Diarrhea   . Hypertensive heart disease with heart failure (HCC)   . History of traumatic brain injury   . Hyperlipidemia 06/30/2015  . Hemiplegia (HCC) 12/13/2011  . Partial epilepsy with impairment of consciousness, intractable (HCC) 12/13/2011    Past Surgical History:  Procedure Laterality Date  . BRAIN SURGERY    . CARDIAC SURGERY    . CHOLECYSTECTOMY    . CORONARY ANGIOPLASTY  WITH STENT PLACEMENT    . HERNIA REPAIR         Family History  Problem Relation Age of Onset  . Hypertension Mother   . Lung cancer Mother   . Heart attack Father   . Hypertension Father   . Hyperlipidemia Father     Social History   Tobacco Use  . Smoking status: Never Smoker  . Smokeless tobacco: Never  Used  Vaping Use  . Vaping Use: Never used  Substance Use Topics  . Alcohol use: No  . Drug use: No    Home Medications Prior to Admission medications   Medication Sig Start Date End Date Taking? Authorizing Provider  acetaminophen (TYLENOL) 325 MG tablet Take 650 mg by mouth every 6 (six) hours as needed for moderate pain.   Yes [provider]  amiodarone (PACERONE) 200 MG tablet Take 200 mg by mouth daily.    Yes [provider]  ammonium lactate (AMLACTIN) 12 % cream Apply 1 g topically at bedtime. Both legs/feet   Yes [provider]  aspirin EC 81 MG tablet Take 81 mg by mouth daily.   Yes [provider]  atorvastatin (LIPITOR) 20 MG tablet Take 20 mg by mouth daily.   Yes [provider]  bethanechol (URECHOLINE) 10 MG tablet Take 10 mg by mouth 3 (three) times daily.    Yes [provider]  bisacodyl (DULCOLAX) 10 MG suppository Place 10 mg rectally 2 (two) times daily as needed for moderate constipation.   Yes [provider]  cloBAZam (ONFI) 10 MG tablet Take 40 mg by mouth daily.   Yes [provider]  clopidogrel (PLAVIX) 75 MG tablet Take 75 mg by mouth daily.   Yes [provider]  Cranberry 450 MG CAPS Take 450 mg by mouth daily.   Yes [provider]  digoxin (LANOXIN) 0.125 MG tablet Take 125 mcg daily by mouth.    Yes [provider]  escitalopram (LEXAPRO) 10 MG tablet Take 10 mg by mouth daily.   Yes [provider]  finasteride (PROSCAR) 5 MG tablet Take 5 mg by mouth daily.   Yes [provider]  gabapentin (NEURONTIN) 300 MG capsule Take 300 mg by mouth at bedtime.   Yes [provider]  guaifenesin (HUMIBID E) 400 MG TABS tablet Take 400 mg by mouth 2 (two) times daily.   Yes [provider]  Ipratropium-Albuterol (COMBIVENT) 20-100 MCG/ACT AERS respimat Inhale 2 puffs into the lungs 2 (two) times daily. Take 2 times daily x 5 days  then every 6 hours as needed. Patient taking differently: Inhale 2 puffs into the lungs in the morning and at bedtime. 11/01/20  Yes Rodolph Bong, MD  lamoTRIgine (LAMICTAL) 200 MG tablet Take 400 mg by mouth 2 (two) times daily.   Yes [provider]  levETIRAcetam (KEPPRA) 1000 MG tablet Take 1,000 mg by mouth 2 (two) times daily. Take along with 250 mg, total 1250 mg at 9 am.   Yes [provider]  levETIRAcetam (KEPPRA) 1000 MG tablet Take 1,000 mg by mouth at bedtime.   Yes [provider]  levETIRAcetam (KEPPRA) 250 MG tablet Take 250 mg by mouth daily. Takes along with 1000 mg , total 1250 mg. At 9 am.   Yes [provider]  Levothyroxine Sodium 112 MCG CAPS Take 112 mcg by mouth daily before breakfast.    Yes [provider]  linaclotide (LINZESS) 290 MCG CAPS capsule Take  290 mcg by mouth daily.   Yes [provider]  loratadine (CLARITIN) 10 MG tablet Take 10 mg by mouth daily.   Yes [provider]  Magnesium Hydroxide (MILK OF MAGNESIA PO) Take 30 mLs by mouth daily as needed (constipation).   Yes [provider]  meloxicam (MOBIC) 15 MG tablet Take 15 mg by mouth daily.   Yes [provider]  Menthol, Topical Analgesic, (BIOFREEZE) 4 % GEL Apply 1 application topically 2 (two) times daily.   Yes [provider]  metoprolol tartrate (LOPRESSOR) 25 MG tablet Take 12.5 mg by mouth 3 (three) times daily.    Yes [provider]  montelukast (SINGULAIR) 10 MG tablet Take 10 mg by mouth at bedtime.   Yes [provider]  Multiple Vitamins-Minerals (MULTIVITAMIN PO) Take 1 tablet by mouth daily.   Yes [provider]  pantoprazole (PROTONIX) 20 MG tablet Take 20 mg by mouth daily.   Yes [provider]  polyethylene glycol (MIRALAX / GLYCOLAX) packet Take 17 g by mouth daily.   Yes [provider]  predniSONE (DELTASONE) 20 MG tablet Take 3 tablets (60 mg  total) by mouth daily with breakfast for 3 days, THEN 2 tablets (40 mg total) daily with breakfast for 3 days, THEN 1 tablet (20 mg total) daily with breakfast for 3 days. 11/01/20 11/10/20 Yes Rodolph Bong, MD  sennosides-docusate sodium (SENOKOT-S) 8.6-50 MG tablet Take 2 tablets by mouth 2 (two) times daily.   Yes [provider]  sodium fluoride (PREVIDENT 5000 PLUS) 1.1 % CREA dental cream Place 1 application onto teeth 2 (two) times daily.   Yes [provider]  tamsulosin (FLOMAX) 0.4 MG CAPS capsule Take 0.8 mg by mouth daily after supper.   Yes [provider]  torsemide (DEMADEX) 20 MG tablet Take 2 tablets (40 mg total) by mouth 2 (two) times daily. 11/04/20  Yes Rodolph Bong, MD    Allergies    Acetaminophen, Azithromycin, Biaxin [clarithromycin], Diltiazem, Other, and Verapamil  Review of Systems   Review of Systems  Constitutional: Positive for fatigue. Negative for fever.  Respiratory: Negative for shortness of breath.   Cardiovascular: Negative for chest pain.  Gastrointestinal: Positive for anal bleeding and blood in stool. Negative for abdominal pain, nausea and vomiting.  Skin: Negative for wound.  Neurological: Negative for syncope and light-headedness.    Physical Exam Updated Vital Signs BP (!) 81/66   Pulse (!) 112   Temp 97.6 F (36.4 C) (Oral)   Resp 19   SpO2 97%   Physical Exam Vitals and nursing note reviewed.  Constitutional:      General: He is not in acute distress.    Appearance: Normal appearance. He is not ill-appearing, toxic-appearing or diaphoretic.  HENT:     Head:     Comments: Prior craniotomy Eyes:     Conjunctiva/sclera: Conjunctivae normal.  Cardiovascular:     Rate and Rhythm: Regular rhythm. Tachycardia present.     Pulses: Normal pulses.  Pulmonary:     Effort: Pulmonary effort is normal. No respiratory distress.  Abdominal:     General: Abdomen is flat. There is no distension.      Palpations: There is no mass.     Tenderness: There is no abdominal tenderness. There is no guarding.  Genitourinary:    Comments: Blood and blood clots in briefs, approx 250cc Musculoskeletal:        General: No deformity or signs of injury.  Cervical back: No rigidity.  Skin:    General: Skin is warm and dry.     Coloration: Skin is not jaundiced or pale.  Neurological:     General: No focal deficit present.     Mental Status: He is alert and oriented to person, place, and time.     ED Results / Procedures / Treatments   Labs (all labs ordered are listed, but only abnormal results are displayed) Labs Reviewed  CBC WITH DIFFERENTIAL/PLATELET - Abnormal; Notable for the following components:      Result Value   RBC 3.95 (*)    Hemoglobin 11.6 (*)    HCT 37.1 (*)    RDW 16.2 (*)    Lymphs Abs 0.5 (*)    All other components within normal limits  COMPREHENSIVE METABOLIC PANEL - Abnormal; Notable for the following components:   Chloride 95 (*)    CO2 37 (*)    Glucose, Bld 142 (*)    BUN 34 (*)    Creatinine, Ser 1.41 (*)    Calcium 8.4 (*)    Total Protein 6.4 (*)    Albumin 2.6 (*)    Total Bilirubin 1.5 (*)    GFR, Estimated 54 (*)    All other components within normal limits  HEMOGLOBIN - Abnormal; Notable for the following components:   Hemoglobin 9.4 (*)    All other components within normal limits  HEMATOCRIT - Abnormal; Notable for the following components:   HCT 30.0 (*)    All other components within normal limits  POC OCCULT BLOOD, ED - Abnormal; Notable for the following components:   Fecal Occult Bld POSITIVE (*)    All other components within normal limits  PROTIME-INR  COMPREHENSIVE METABOLIC PANEL  HEMOGLOBIN  HEMOGLOBIN  HEMATOCRIT  HEMATOCRIT  HEMOGLOBIN  HEMOGLOBIN  HEMATOCRIT  HEMATOCRIT  TYPE AND SCREEN  ABO/RH  PREPARE RBC (CROSSMATCH)  PREPARE RBC (CROSSMATCH)    EKG None  Radiology CT Angio Abd/Pel W and/or Wo  Contrast  Result Date: 11/07/2020 CLINICAL DATA:  Bloody stools, COVID-19 positivity EXAM: CTA ABDOMEN AND PELVIS WITHOUT AND WITH CONTRAST TECHNIQUE: Multidetector CT imaging of the abdomen and pelvis was performed using the standard protocol during bolus administration of intravenous contrast. Multiplanar reconstructed images and MIPs were obtained and reviewed to evaluate the vascular anatomy. CONTRAST:  100mL OMNIPAQUE IOHEXOL 350 MG/ML SOLN COMPARISON:  03/30/2016 FINDINGS: VASCULAR Aorta: Abdominal aorta demonstrates atherosclerotic calcifications without aneurysmal dilatation. No dissection is seen. Celiac: Patent without evidence of aneurysm, dissection, vasculitis or significant stenosis. SMA: Patent without evidence of aneurysm, dissection, vasculitis or significant stenosis. 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 Bilateral atherosclerotic calcifications are seen with mild stenoses in the origins of the main renal arteries. IMA: Patent Iliacs: Atherosclerotic calcifications are noted without aneurysmal dilatation or dissection. Outflow in the upper thighs is within normal limits. Veins: No specific venous abnormality is noted. Review of the MIP images confirms the above findings. NON-VASCULAR Lower chest: No acute abnormality. Hepatobiliary: No focal liver abnormality is seen. Status post cholecystectomy. No biliary dilatation. Pancreas: Unremarkable. No pancreatic ductal dilatation or surrounding inflammatory changes. Spleen: Normal in size without focal abnormality. Adrenals/Urinary Tract: Adrenal glands are within normal limits. Kidneys show no renal calculi or obstructive changes. Normal enhancement pattern is noted bilaterally. Bladder is well distended. Stomach/Bowel: The appendix is not well visualized although no inflammatory changes to suggest appendicitis are seen. No obstructive or inflammatory changes of the colon are noted. The sigmoid is significantly  redundant. No findings of  acute GI hemorrhage are seen. Small bowel and stomach are within normal limits. Lymphatic: No significant vascular findings are present. No enlarged abdominal or pelvic lymph nodes. Reproductive: Prostate is unremarkable. Other: No abdominal wall hernia or abnormality. No abdominopelvic ascites. Musculoskeletal: Degenerative changes of lumbar spine are noted. Stable sclerotic focus is noted in the L5 vertebral body on the right. IMPRESSION: VASCULAR No abdominal aortic aneurysm or dissection is noted. No findings to suggest active arterial hemorrhage are noted within the GI tract. Scattered atherosclerotic disease as described. NON-VASCULAR No acute abnormality in the abdomen and pelvis. Electronically Signed   By: Alcide CleverMark  Lukens M.D.   On: 11/07/2020 19:44    Procedures .Critical Care Performed by: Alvira MondaySchlossman, Esteban Kobashigawa, MD Authorized by: Alvira MondaySchlossman, Cache Decoursey, MD   Critical care provider statement:    Critical care time (minutes):  45   Critical care was time spent personally by me on the following activities:  Discussions with consultants, evaluation of patient's response to treatment, examination of patient, ordering and performing treatments and interventions, ordering and review of laboratory studies, ordering and review of radiographic studies, pulse oximetry, re-evaluation of patient's condition, obtaining history from patient or surrogate and review of old charts   (including critical care time)  Medications Ordered in ED Medications  0.9 %  sodium chloride infusion (Manually program via Guardrails IV Fluids) (0 mLs Intravenous Hold 11/07/20 1815)  amiodarone (PACERONE) tablet 200 mg (has no administration in time range)  atorvastatin (LIPITOR) tablet 20 mg (has no administration in time range)  digoxin (LANOXIN) tablet 125 mcg (has no administration in time range)  cloBAZam (ONFI) tablet 40 mg (has no administration in time range)  lamoTRIgine (LAMICTAL) tablet 400 mg (400 mg Oral Given 11/07/20  2327)  Ipratropium-Albuterol (COMBIVENT) respimat 2 puff (2 puffs Inhalation Given 11/07/20 2326)  pantoprazole (PROTONIX) injection 40 mg (40 mg Intravenous Given 11/07/20 2324)  0.9 %  sodium chloride infusion ( Intravenous New Bag/Given 11/07/20 2324)  ondansetron (ZOFRAN) tablet 4 mg (has no administration in time range)    Or  ondansetron (ZOFRAN) injection 4 mg (has no administration in time range)  0.9 %  sodium chloride infusion (Manually program via Guardrails IV Fluids) (0 mLs Intravenous Hold 11/07/20 2308)  levothyroxine (SYNTHROID) tablet 112 mcg (has no administration in time range)  levETIRAcetam (KEPPRA) tablet 1,250 mg (has no administration in time range)    And  levETIRAcetam (KEPPRA) tablet 1,000 mg (1,000 mg Oral Given 11/07/20 2327)  0.9 %  sodium chloride infusion (Manually program via Guardrails IV Fluids) (0 mLs Intravenous Hold 11/08/20 0124)  sodium chloride 0.9 % bolus 1,000 mL (0 mLs Intravenous Stopped 11/07/20 1826)  iohexol (OMNIPAQUE) 350 MG/ML injection 100 mL (100 mLs Intravenous Contrast Given 11/07/20 1859)  0.9 %  sodium chloride infusion ( Intravenous New Bag/Given 11/07/20 2059)  sodium chloride 0.9 % bolus 1,000 mL (0 mLs Intravenous Stopped 11/08/20 0123)  sodium chloride 0.9 % bolus 1,000 mL (0 mLs Intravenous Stopped 11/08/20 0152)    ED Course  I have reviewed the triage vital signs and the nursing notes.  Pertinent labs & imaging results that were available during my care of the patient were reviewed by me and considered in my medical decision making (see chart for details).    MDM Rules/Calculators/A&P                           70yo male with history of COVID  19 infection admission 1/3-1/11 with severe sepsis and AKI, CAD, htn, epilepsy, atrial fibrillation not on anticoagulation, history of subdural hematoma, left hemiparesis who presents with concern for rectal bleeding from [facility].  Arrives with mild tachycardia, BP 100s/70s.   Hgb stable but bleeding began just prior to arrival.  Exam with large blood clots in brief, history of same just PTA at facility concerning for significant lower GI bleed/?diverticular bleed.   CTA ordered.  Discussed with Gunnar Fusi of [facility].    Blood pressures trended down to 80s systolic and significant amount of blood passed per rectum.  Ordered 1U pRBCs. CTA without active arterial bleed.  Discussed with Dr. Marina Goodell.  BPs remain stable 90s-100s following transfusion.  Will admit to hospitalist service.    Final Clinical Impression(s) / ED Diagnoses Final diagnoses:  Rectal bleeding  Lower GI bleed    Rx / DC Orders ED Discharge Orders    None       Alvira Monday, MD 11/08/20 [phone]

## 2020-11-08 ENCOUNTER — Encounter (HOSPITAL_COMMUNITY): Payer: Self-pay | Admitting: Family Medicine

## 2020-11-08 ENCOUNTER — Encounter (HOSPITAL_COMMUNITY)
Admission: EM | Disposition: A | Discharge status: Skilled Nursing Facility | Payer: Self-pay | Source: Skilled Nursing Facility | Attending: Internal Medicine

## 2020-11-08 ENCOUNTER — Inpatient Hospital Stay (HOSPITAL_COMMUNITY): Payer: Medicare Other

## 2020-11-08 DIAGNOSIS — R578 Other shock: Secondary | ICD-10-CM | POA: Diagnosis present

## 2020-11-08 DIAGNOSIS — K633 Ulcer of intestine: Secondary | ICD-10-CM | POA: Diagnosis not present

## 2020-11-08 DIAGNOSIS — K625 Hemorrhage of anus and rectum: Secondary | ICD-10-CM

## 2020-11-08 DIAGNOSIS — K529 Noninfective gastroenteritis and colitis, unspecified: Secondary | ICD-10-CM | POA: Insufficient documentation

## 2020-11-08 DIAGNOSIS — K922 Gastrointestinal hemorrhage, unspecified: Secondary | ICD-10-CM

## 2020-11-08 HISTORY — PX: BIOPSY: SHX5522

## 2020-11-08 HISTORY — PX: FLEXIBLE SIGMOIDOSCOPY: SHX5431

## 2020-11-08 LAB — COMPREHENSIVE METABOLIC PANEL
ALT: 15 U/L (ref 0–44)
AST: 21 U/L (ref 15–41)
Albumin: 2.1 g/dL — ABNORMAL LOW (ref 3.5–5.0)
Alkaline Phosphatase: 60 U/L (ref 38–126)
Anion gap: 10 (ref 5–15)
BUN: 41 mg/dL — ABNORMAL HIGH (ref 8–23)
CO2: 30 mmol/L (ref 22–32)
Calcium: 7.2 mg/dL — ABNORMAL LOW (ref 8.9–10.3)
Chloride: 107 mmol/L (ref 98–111)
Creatinine, Ser: 1.49 mg/dL — ABNORMAL HIGH (ref 0.61–1.24)
GFR, Estimated: 51 mL/min — ABNORMAL LOW (ref >60)
Glucose, Bld: 124 mg/dL — ABNORMAL HIGH (ref 70–99)
Potassium: 3.8 mmol/L (ref 3.5–5.1)
Sodium: 147 mmol/L — ABNORMAL HIGH (ref 135–145)
Total Bilirubin: 2 mg/dL — ABNORMAL HIGH (ref 0.3–1.2)
Total Protein: 4.9 g/dL — ABNORMAL LOW (ref 6.5–8.1)

## 2020-11-08 LAB — CBC
HCT: 23.1 % — ABNORMAL LOW (ref 39.0–52.0)
Hemoglobin: 7.5 g/dL — ABNORMAL LOW (ref 13.0–17.0)
MCH: 30.1 pg (ref 26.0–34.0)
MCHC: 32.5 g/dL (ref 30.0–36.0)
MCV: 92.8 fL (ref 80.0–100.0)
Platelets: 164 10*3/uL (ref 150–400)
RBC: 2.49 MIL/uL — ABNORMAL LOW (ref 4.22–5.81)
RDW: 17.2 % — ABNORMAL HIGH (ref 11.5–15.5)
WBC: 9.4 10*3/uL (ref 4.0–10.5)
nRBC: 0 % (ref 0.0–0.2)

## 2020-11-08 LAB — HEMOGLOBIN
Hemoglobin: 9.4 g/dL — ABNORMAL LOW (ref 13.0–17.0)
Hemoglobin: 11.2 g/dL — ABNORMAL LOW (ref 13.0–17.0)

## 2020-11-08 LAB — HEMATOCRIT
HCT: 30 % — ABNORMAL LOW (ref 39.0–52.0)
HCT: 35.1 % — ABNORMAL LOW (ref 39.0–52.0)

## 2020-11-08 LAB — PREPARE RBC (CROSSMATCH)

## 2020-11-08 SURGERY — SIGMOIDOSCOPY, FLEXIBLE
Anesthesia: Moderate Sedation

## 2020-11-08 MED ORDER — ALBUMIN HUMAN 25 % IV SOLN
50.0000 g | Freq: Once | INTRAVENOUS | Status: AC
  Administered 2020-11-08: 50 g via INTRAVENOUS
  Filled 2020-11-08: qty 200

## 2020-11-08 MED ORDER — FENTANYL CITRATE (PF) 100 MCG/2ML IJ SOLN
INTRAMUSCULAR | Status: DC | PRN
  Administered 2020-11-08: 25 ug via INTRAVENOUS

## 2020-11-08 MED ORDER — SODIUM CHLORIDE 0.9 % IV BOLUS
1000.0000 mL | Freq: Once | INTRAVENOUS | Status: AC
  Administered 2020-11-08: 1000 mL via INTRAVENOUS

## 2020-11-08 MED ORDER — FENTANYL CITRATE (PF) 100 MCG/2ML IJ SOLN
INTRAMUSCULAR | Status: AC
  Filled 2020-11-08: qty 4

## 2020-11-08 MED ORDER — PANTOPRAZOLE SODIUM 40 MG IV SOLR
40.0000 mg | INTRAVENOUS | Status: DC
  Administered 2020-11-08 – 2020-11-14 (×7): 40 mg via INTRAVENOUS
  Filled 2020-11-08 (×6): qty 40

## 2020-11-08 MED ORDER — SODIUM CHLORIDE 0.9 % IV SOLN
250.0000 mL | INTRAVENOUS | Status: DC
  Administered 2020-11-08: 250 mL via INTRAVENOUS

## 2020-11-08 MED ORDER — PANTOPRAZOLE SODIUM 40 MG IV SOLR: 40.0000 mg | Freq: Two times a day (BID) | INTRAVENOUS | Status: DC

## 2020-11-08 MED ORDER — MIDAZOLAM HCL (PF) 10 MG/2ML IJ SOLN
INTRAMUSCULAR | Status: DC | PRN
  Administered 2020-11-08: 1 mg via INTRAVENOUS

## 2020-11-08 MED ORDER — NOREPINEPHRINE 4 MG/250ML-% IV SOLN
2.0000 ug/min | INTRAVENOUS | Status: DC
  Administered 2020-11-08: 2 ug/min via INTRAVENOUS
  Administered 2020-11-08: 3 ug/min via INTRAVENOUS
  Filled 2020-11-08: qty 250

## 2020-11-08 MED ORDER — MIDAZOLAM HCL (PF) 5 MG/ML IJ SOLN
INTRAMUSCULAR | Status: AC
  Filled 2020-11-08: qty 2

## 2020-11-08 MED ORDER — SODIUM CHLORIDE 0.9% IV SOLUTION: Freq: Once | INTRAVENOUS | Status: DC

## 2020-11-08 MED ORDER — SODIUM CHLORIDE 0.9 % IV BOLUS
500.0000 mL | Freq: Once | INTRAVENOUS | Status: AC
  Administered 2020-11-08: 500 mL via INTRAVENOUS

## 2020-11-08 MED ORDER — SODIUM CHLORIDE 0.9% IV SOLUTION: Freq: Once | INTRAVENOUS | Status: AC

## 2020-11-08 MED ORDER — DIPHENHYDRAMINE HCL 50 MG/ML IJ SOLN
INTRAMUSCULAR | Status: AC
  Filled 2020-11-08: qty 1

## 2020-11-08 NOTE — ED Notes (Signed)
MD Opyd paged regarding pts low BP.

## 2020-11-08 NOTE — ED Notes (Signed)
Verified blood consent with Tim Lair via phone. Tim Lair, HCPOA gave verbal consent for blood transfusion

## 2020-11-08 NOTE — Progress Notes (Signed)
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  He tested positive for COVID-19 on 1/3, discharged on 1/11.  He was recommended 21 days of isolation up until 1/24  Subjective / 24h Interval events: Hypotensive overnight, requiring several boluses as well as 3 units of packed red blood cells.  Continues to have bloody bowel movement per RN  Assessment & Plan: Principal Problem Bright red blood per rectum, acute blood loss anemia, hemorrhagic shock -Intermittently hypotensive, underwent CT angiogram without a clear source -GI consulted, appreciate input -Continue PPI -Intermittently hypotensive, have pressors on standby as he will go to get a tagged RBC scan  Active Problems Recent COVID-19 infection -Currently no respiratory symptoms, continue isolation up until 1/24.  On room air  TBI, seizure disorder -TBI at age 32, chronic right-sided weakness and speech difficulties -Continue Keppra, Lamictal and rest of home medications  A. fib/flutter -Not anticoagulated due to history of SDH -Continue amiodarone, digoxin  CAD -Hold aspirin and Plavix in the setting of bleed  Chronic diastolic CHF -Currently appears euvolemic, monitor while getting fluids/blood  Chronic kidney disease stage II -Baseline about 1.2, currently at 1.4   Scheduled Meds: . sodium chloride   Intravenous Once  . sodium chloride   Intravenous Once  . sodium chloride   Intravenous Once  . amiodarone  200 mg Oral Daily  . atorvastatin  20 mg Oral Daily  . cloBAZam  40 mg Oral Daily  . digoxin   125 mcg Oral Daily  . Ipratropium-Albuterol  2 puff Inhalation BID  . lamoTRIgine  400 mg Oral BID  . levETIRAcetam  1,250 mg Oral Daily   And  . levETIRAcetam  1,000 mg Oral QHS  . levothyroxine  112 mcg Oral Q0600  . pantoprazole (PROTONIX) IV  40 mg Intravenous Q12H   Continuous Infusions: . sodium chloride 250 mL (11/08/20 0834)  . norepinephrine (LEVOPHED) Adult infusion 2 mcg/min (11/08/20 8341)  . sodium chloride     PRN Meds:.ondansetron **OR** ondansetron (ZOFRAN) IV  Diet Orders (From admission, onward)    Start     Ordered   11/07/20 2123  Diet NPO time specified Except for: Ice Chips, Sips with Meds  Diet effective now       Question Answer Comment  Except for Ice Chips   Except for Sips with Meds      11/07/20 2124          DVT prophylaxis: SCDs Start: 11/07/20 2120     Code Status: DNR  Family Communication: d/w patient  Status is: Inpatient  Remains inpatient appropriate because:Inpatient level of care appropriate due to severity of illness   Dispo: The patient is from: SNF              Anticipated d/c is to: SNF              Anticipated d/c date is: 3 days              Patient currently is not medically stable to d/c.  Consultants:  GI  Procedures:  None  Microbiology  None  Antimicrobials: None     Objective: Vitals:   11/08/20 0700 11/08/20 0720 11/08/20 0730 11/08/20 1034  BP: (!) 87/66 110/64 (!) 120/93 93/60  Pulse: (!) 111 (!) 109 97 (!) 108  Resp: (!) 22 14 17 20   Temp:    97.9 F (36.6 C)  TempSrc:    Oral  SpO2: 99% 97% (!) 79% 99%    Intake/Output Summary (Last 24 hours) at 11/08/2020 1054 Last data filed at 11/08/2020 0416 Gross per 24 hour  Intake 6890 ml  Output --  Net 6890 ml   There were no vitals filed for this visit.  Examination:  Constitutional: NAD Eyes: no scleral icterus ENMT: Mucous membranes are moist.  Neck: normal, supple Respiratory: clear to auscultation bilaterally, no wheezing, no  crackles. Normal respiratory effort. No accessory muscle use.  Cardiovascular: Regular rate and rhythm, no murmurs / rubs / gallops. No LE edema. Good peripheral pulses Abdomen: non distended, no tenderness. Bowel sounds positive.  Musculoskeletal: no clubbing / cyanosis.  Skin: no rashes Neurologic: no new focal deficits  Data Reviewed: I have independently reviewed following labs and imaging studies   CBC: Recent Labs  Lab 11/07/20 1508 11/07/20 2124 11/08/20 0446  WBC 8.3  --   --   NEUTROABS 7.0  --   --   HGB 11.6* 9.4* 11.2*  HCT 37.1* 30.0* 35.1*  MCV 93.9  --   --   PLT 266  --   --    Basic Metabolic Panel: Recent Labs  Lab 11/07/20 1508 11/08/20 0446  NA 143 147*  K 4.5 3.8  CL 95* 107  CO2 37* 30  GLUCOSE 142* 124*  BUN 34* 41*  CREATININE 1.41* 1.49*  CALCIUM 8.4* 7.2*   Liver Function Tests: Recent Labs  Lab 11/07/20 1508 11/08/20 0446  AST 30 21  ALT 21 15  ALKPHOS 86 60  BILITOT 1.5* 2.0*  PROT 6.4* 4.9*  ALBUMIN 2.6* 2.1*   Coagulation Profile: Recent Labs  Lab 11/07/20 1508  INR 1.0   HbA1C: No results for input(s): HGBA1C in the last 72 hours. CBG: No results for input(s): GLUCAP in the last 168 hours.  No results found for this or any previous visit (from the past 240 hour(s)).   Radiology Studies: CT Angio Abd/Pel W and/or Wo Contrast  Result Date: 11/07/2020 CLINICAL DATA:  Bloody stools, COVID-19 positivity EXAM: CTA ABDOMEN AND PELVIS WITHOUT AND WITH CONTRAST TECHNIQUE: Multidetector CT imaging of the abdomen and pelvis was performed using the standard protocol during bolus administration of intravenous contrast. Multiplanar reconstructed images and MIPs were obtained and reviewed to evaluate the vascular anatomy. CONTRAST:  11/09/2020 OMNIPAQUE IOHEXOL 350 MG/ML SOLN COMPARISON:  03/30/2016 FINDINGS: VASCULAR Aorta: Abdominal aorta demonstrates atherosclerotic calcifications without aneurysmal dilatation. No dissection is seen.  Celiac: Patent without evidence of aneurysm, dissection, vasculitis or significant stenosis. SMA: Patent without evidence of aneurysm, dissection, vasculitis or significant stenosis. 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 Bilateral atherosclerotic calcifications are seen with mild stenoses in the origins of the main renal arteries. IMA: Patent Iliacs: Atherosclerotic calcifications are noted without aneurysmal dilatation or dissection. Outflow in the upper thighs is within normal limits. Veins: No specific venous abnormality is noted. Review of the MIP images confirms the above findings. NON-VASCULAR Lower chest: No acute abnormality. Hepatobiliary: No focal liver abnormality is seen. Status post cholecystectomy. No biliary dilatation. Pancreas: Unremarkable. No pancreatic ductal dilatation or surrounding inflammatory changes. Spleen: Normal in size without focal abnormality. Adrenals/Urinary Tract:  Adrenal glands are within normal limits. Kidneys show no renal calculi or obstructive changes. Normal enhancement pattern is noted bilaterally. Bladder is well distended. Stomach/Bowel: The appendix is not well visualized although no inflammatory changes to suggest appendicitis are seen. No obstructive or inflammatory changes of the colon are noted. The sigmoid is significantly redundant. No findings of acute GI hemorrhage are seen. Small bowel and stomach are within normal limits. Lymphatic: No significant vascular findings are present. No enlarged abdominal or pelvic lymph nodes. Reproductive: Prostate is unremarkable. Other: No abdominal wall hernia or abnormality. No abdominopelvic ascites. Musculoskeletal: Degenerative changes of lumbar spine are noted. Stable sclerotic focus is noted in the L5 vertebral body on the right. IMPRESSION: VASCULAR No abdominal aortic aneurysm or dissection is noted. No findings to suggest active arterial hemorrhage are noted within the GI tract. Scattered atherosclerotic  disease as described. NON-VASCULAR No acute abnormality in the abdomen and pelvis. Electronically Signed   By: Alcide Clever M.D.   On: 11/07/2020 19:44    Pamella Pert, MD, PhD [facility]  Between 7 am - 7 pm I am available, please contact me via Amion or Securechat  Between 7 pm - 7 am I am not available, please contact night coverage MD/APP via Amion

## 2020-11-08 NOTE — ED Notes (Signed)
Signed Endoscopy consent bedside

## 2020-11-08 NOTE — ED Notes (Signed)
Pt had a BM consisting of a small amount of frank, red blood. Pericare performed. New male primofit placed

## 2020-11-08 NOTE — Op Note (Signed)
[facility] Patient Name: Austin GourdMichael Hartman Procedure Date: 11/08/2020 MRN: [id number] Attending MD: Iva Booparl E Sewell Pitner , MD Date of Birth: July 14, 1951 CSN: [id number] Age: 70 Admit Type: Emergency Department Procedure:                Flexible Sigmoidoscopy Indications:              Hematochezia Providers:                Iva Booparl E. Yannis Gumbs, MD, Rogue JurySandy Andrews, RN, Michele McalpineErik                            [place] Technician Referring MD:              Medicines:                Midazolam 2 mg IV, Fentanyl 25 micrograms IV Complications:            No immediate complications. Estimated Blood Loss:     Estimated blood loss was minimal. Procedure:                Pre-Anesthesia Assessment:                           - Prior to the procedure, a History and Physical                            was performed, and patient medications and                            allergies were reviewed. The patient's tolerance of                            previous anesthesia was also reviewed. The risks                            and benefits of the procedure and the sedation                            options and risks were discussed with the patient.                            All questions were answered, and informed consent                            was obtained. Prior Anticoagulants: The patient                            last took Plavix (clopidogrel) 1 day prior to the                            procedure. ASA Grade Assessment: III - A patient                            with severe systemic disease. After reviewing the  risks and benefits, the patient was deemed in                            satisfactory condition to undergo the procedure.                           After obtaining informed consent, the scope was                            passed under direct vision. The [id number]-[id number]                            ([id number]) Olympus pediatric colonscope was                             introduced through the anus and advanced to the the                            descending colon. The flexible sigmoidoscopy was                            accomplished without difficulty. The patient                            tolerated the procedure well. The quality of the                            bowel preparation was fair. Scope In: 2:32:50 PM Scope Out: 2:39:46 PM Total Procedure Duration: 0 hours 6 minutes 55 seconds  Findings:      The digital rectal exam findings include blood. Pertinent negatives       include no anal lesion or abnormality.      Red blood was found in the rectum, in the sigmoid colon and in the       descending colon.      A diffuse area of severely congested, eroded, plaque covered and       ulcerated mucosa was found in the sigmoid colon. Biopsies were taken       with a cold forceps for histology. Verification of patient       identification for the specimen was done. Impression:               - Preparation of the colon was fair.                           - Blood found on digital rectal exam.                           - Blood in the rectum, in the sigmoid colon and in                            the descending colon. fresh and clotted - able to                            irrigate and suction                           -  Congested, eroded, plaque covered and ulcerated                            mucosa in the sigmoid colon. Biopsied. COLITIS IN                            SIGMOID - MANY PUNCHED OUT ULCERS W/ EXUDAT - ?                            ATYPICAL ISHEMIA, VIRAL 9HAD STEROIDS AND                            REMDESIVIR FOR COVID) Moderate Sedation:      Moderate (conscious) sedation was administered by the endoscopy nurse       and supervised by the endoscopist. The following parameters were       monitored: oxygen saturation, heart rate, blood pressure, respiratory       rate, EKG, adequacy of pulmonary ventilation, and response to care.       Total  physician intraservice time was 10 minutes. Recommendation:           - Await bxs, transfuse 2 Y RBC Hgb 7 - bet 11 was                            spurious, CHECK DIC PANEL ALSO INITIAL INR OK                           if fails to stop bleeding adequately correct w/                            PLT's since Plavix has impaired effectiveness of                            existing PLT's                           Hydrate aggressively also                           Sister updated Procedure Code(s):        --- Professional ---                           [phone], Sigmoidoscopy, flexible; with biopsy, single                            or multiple Diagnosis Code(s):        --- Professional ---                           K62.5, Hemorrhage of anus and rectum                           K92.2, Gastrointestinal hemorrhage, unspecified  K63.89, Other specified diseases of intestine                           K63.3, Ulcer of intestine                           K92.1, Melena (includes Hematochezia) CPT copyright 2019 [employer]. All rights reserved. The codes documented in this report are preliminary and upon coder review may  be revised to meet current compliance requirements. Iva Boop, MD 11/08/2020 3:02:29 PM This report has been signed electronically. Number of Addenda: 0

## 2020-11-08 NOTE — ED Notes (Signed)
Pt had another large bowel movement of blood and large clots. MD Opyd made aware.

## 2020-11-08 NOTE — ED Notes (Signed)
In room with patient to assist with EGD.

## 2020-11-08 NOTE — Consult Note (Addendum)
Referring Provider:  [facility]         Primary Care Physician:  Galvin Proffer, MD Primary Gastroenterologist:   unassigned          We were asked to see this patient for:    GI bleed            ASSESSMENT / PLAN:   # 70 yo male with large volume hematochezia, hemodynamic instability. On pressors. CTA negative for active bleeding. Being admitted to SDU. Had received 3 units of blood, hgb current mid 11. Still actively bleeding. No bowel inflammation on CT scan. This could be a diverticular hemorrhage. Doubt brisk upper GI bleed but not ruled out.    --SBP in upper 80's now ( on pressors) --Will give another fluid bolus --Monitor hgb, transfuse as needed.  -Agree with PPI but will increase to BID for now just in case this is upper bleed --May need flexible sigmoidoscopy later today. Keep NPO for now.   # Recent COVID19 infection ( positive on 10/24/20).   # TBI / dysarthria.    I have also seen and evaluated the patient.  I suspect this is a lower GI bleed likely diverticulosis that I see on the CT scan.  We have not identified a source of active bleeding.  Though a tagged red cell scan is reasonable I think we would be better served by an unprepped flexible sigmoidoscopy and possible upper GI endoscopy.  He still requires COVID precautions and we will follow that protocol.  I have spoken to his iister Austin Hartman, who is his power of attorney and explained the risks benefits and indications of the procedures and she understands and accepts these risks and agrees to proceed.  We will obtain phone consent from her.  I am giving the patient some additional hydration.  I think he is dry based upon his BUN and creatinine and what he has been through with his COVID infection and diarrhea that he had.  We will try to catch up on the volume recheck hemoglobin and use pressors as needed.  I have spoken to Dr. Elvera Lennox a who says that the pressors were really ordered just to have for the hours he  would spend over at nuclear medicine.  Austin Boop, MD, [facility] 11/08/2020 1:09 PM   HPI:                                                                                                                             Chief Complaint: GI bleed  Austin Hartman is a 70 y.o. male with PMH significant for COVID 7 ( early January 2022), CAD, HTN, CKD 2, epilepsy, AFib ( not anticoagulated), chronic diastolic CHF, TBI, chronic subdural hematoma, craniectomy, right hemiplegia.   Patient was hospitalized earlier this month with severe sepsis, COVID 19, acute respiratory failure. diarrhea , and AKI. C-diff was negative. Patient received a full course of IV antibiotics,  IV Solu-Medrol, full course of IV remdesivir, inhalers, vitamin C, and zinc. Patient Respiratory failure improved, he was discharged on 11/01/20.   Patient presented to ED yesterday from SNF for evaluation of rectal bleeding. He is unable to provide much history, no family is present. He had transient hypotension, mild tachycardia which initially improved with IVF. He has AKI. In the ED had had a large marroon stool with clots per notes. Baseline hgb in mid 11 range, down to 9.4 last evening. CTA negative for acute bleeding. He has received 3 units of blood and hgb 11.2 this am. BP still soft, ( SBP in upper 80's) on pressors. Tagged RBC scan has already been ordered.    PREVIOUS ENDOSCOPIC EVALUATIONS / PERTINENT STUDIES   unable to locate any endoscopic reports. Patient tries to tell me he has had two colonoscopies in [place], [place]   Past Medical History:  Diagnosis Date  . Abdominal pain, generalized 03/29/2016  . Altered mental state 03/29/2016  . At high risk for aspiration 03/29/2016  . CHF (congestive heart failure) (HCC)   . Chronic constipation 03/29/2016  . Coronary artery disease   . Diarrhea   . DNR (do not resuscitate) 03/29/2016  . Dysphagia   . Epilepsy (HCC) 03/29/2016  . Essential hypertension   .  Hemiplegia (HCC) 12/13/2011  . History of traumatic brain injury   . Hyperlipidemia 06/30/2015  . Hypertension   . Hypotension, unspecified 03/29/2016  . Hypothermia 03/29/2016  . Left hemiparesis (HCC) 03/29/2016  . Leukocytosis 03/29/2016  . 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 Partial epilepsy with impairment of consciousness, intractable (HCC) 12/13/2011  . Sepsis (HCC)   . Stroke (HCC)   . TBI (traumatic brain injury) (HCC)   . Typical atrial flutter (HCC) 05/07/2016    Past Surgical History:  Procedure Laterality Date  . BRAIN SURGERY    . CARDIAC SURGERY    . CHOLECYSTECTOMY    . CORONARY ANGIOPLASTY WITH STENT PLACEMENT    . HERNIA REPAIR      Prior to Admission medications   Medication Sig Start Date End Date Taking? Authorizing Provider  acetaminophen (TYLENOL) 325 MG tablet Take 650 mg by mouth every 6 (six) hours as needed for moderate pain.   Yes [provider]  amiodarone (PACERONE) 200 MG tablet Take 200 mg by mouth daily.    Yes [provider]  ammonium lactate (AMLACTIN) 12 % cream Apply 1 g topically at bedtime. Both legs/feet   Yes [provider]  aspirin EC 81 MG tablet Take 81 mg by mouth daily.   Yes [provider]  atorvastatin (LIPITOR) 20 MG tablet Take 20 mg by mouth daily.   Yes [provider]  bethanechol (URECHOLINE) 10 MG tablet Take 10 mg by mouth 3 (three) times daily.    Yes [provider]  bisacodyl (DULCOLAX) 10 MG suppository Place 10 mg rectally 2 (two) times daily as needed for moderate constipation.   Yes [provider]  cloBAZam (ONFI) 10 MG tablet Take 40 mg by mouth daily.   Yes [provider]  clopidogrel (PLAVIX) 75 MG tablet Take 75 mg by mouth daily.   Yes [provider]  Cranberry 450 MG CAPS Take 450 mg by mouth daily.   Yes  [provider]  digoxin (LANOXIN) 0.125 MG tablet Take 125 mcg daily by mouth.    Yes [provider]  escitalopram (LEXAPRO) 10 MG tablet Take 10 mg by mouth daily.   Yes [provider]  finasteride (PROSCAR) 5 MG tablet Take 5 mg by mouth daily.   Yes [provider]  gabapentin (NEURONTIN) 300 MG capsule Take 300 mg by mouth at bedtime.   Yes [provider]  guaifenesin (HUMIBID E) 400 MG TABS tablet Take 400 mg by mouth 2 (two) times daily.   Yes [provider]  Ipratropium-Albuterol (COMBIVENT) 20-100 MCG/ACT AERS respimat Inhale 2 puffs into the lungs 2 (two) times daily. Take 2 times daily x 5 days then every 6 hours as needed. Patient taking differently: Inhale 2 puffs into the lungs in the morning and at bedtime. 11/01/20  Yes Rodolph Bonghompson, Daniel V, MD  lamoTRIgine (LAMICTAL) 200 MG tablet Take 400 mg by mouth 2 (two) times daily.   Yes [provider]  levETIRAcetam (KEPPRA) 1000 MG tablet Take 1,000 mg by mouth 2 (two) times daily. Take along with 250 mg, total 1250 mg at 9 am.   Yes [provider]  levETIRAcetam (KEPPRA) 1000 MG tablet Take 1,000 mg by mouth at bedtime.   Yes [provider]  levETIRAcetam (KEPPRA) 250 MG tablet Take 250 mg by mouth daily. Takes along with 1000 mg , total 1250 mg. At 9 am.   Yes [provider]  Levothyroxine Sodium 112 MCG CAPS Take 112 mcg by mouth daily before breakfast.    Yes [provider]  linaclotide (LINZESS) 290 MCG CAPS capsule Take 290 mcg by mouth daily.   Yes [provider]  loratadine (CLARITIN) 10 MG tablet Take 10 mg by mouth daily.   Yes [provider]  Magnesium Hydroxide (MILK OF MAGNESIA PO) Take 30 mLs by mouth daily as needed (constipation).   Yes [provider]  meloxicam (MOBIC) 15 MG tablet Take 15 mg by mouth daily.   Yes [provider]  Menthol, Topical Analgesic, (BIOFREEZE) 4 % GEL  Apply 1 application topically 2 (two) times daily.   Yes [provider]  metoprolol tartrate (LOPRESSOR) 25 MG tablet Take 12.5 mg by mouth 3 (three) times daily.    Yes [provider]  montelukast (SINGULAIR) 10 MG tablet Take 10 mg by mouth at bedtime.   Yes [provider]  Multiple Vitamins-Minerals (MULTIVITAMIN PO) Take 1 tablet by mouth daily.   Yes [provider]  pantoprazole (PROTONIX) 20 MG tablet Take 20 mg by mouth daily.   Yes [provider]  polyethylene glycol (MIRALAX / GLYCOLAX) packet Take 17 g by mouth daily.   Yes [provider]  predniSONE (DELTASONE) 20 MG tablet Take 3 tablets (60 mg total) by mouth daily with breakfast for 3 days, THEN 2 tablets (40 mg total) daily with breakfast for 3 days, THEN 1 tablet (20 mg total) daily with breakfast for 3 days. 11/01/20 11/10/20 Yes Rodolph Bonghompson, Daniel V, MD  sennosides-docusate sodium (SENOKOT-S) 8.6-50 MG tablet Take 2 tablets by mouth 2 (two) times daily.   Yes [provider]  sodium fluoride (PREVIDENT 5000 PLUS) 1.1 % CREA dental cream Place 1 application onto teeth 2 (two) times daily.   Yes [provider]  tamsulosin (FLOMAX) 0.4 MG CAPS capsule Take 0.8 mg by mouth daily after supper.   Yes [provider]  torsemide (DEMADEX) 20 MG tablet Take 2 tablets (40 mg total) by mouth 2 (two) times daily. 11/04/20  Yes Rodolph Bonghompson, Daniel V, MD    Current Facility-Administered Medications  Medication Dose Route Frequency Provider Last Rate Last Admin  . 0.9 %  sodium chloride infusion (Manually program via Guardrails IV Fluids)   Intravenous Once Opyd, Lavone Neri, MD   Held at 11/07/20 1815  . 0.9 %  sodium chloride infusion (Manually program via Guardrails IV Fluids)   Intravenous Once Briscoe Deutscher, MD   Held at 11/07/20 2308  . 0.9 %  sodium chloride infusion (Manually program via Guardrails IV Fluids)   Intravenous Once Opyd, Lavone Neri, MD   Held at  11/08/20 0124  . 0.9 %  sodium chloride infusion  250 mL Intravenous Continuous Leatha Gilding, MD 20 mL/hr at 11/08/20 0834 250 mL at 11/08/20 0834  . amiodarone (PACERONE) tablet 200 mg  200 mg Oral Daily Opyd, Lavone Neri, MD      . atorvastatin (LIPITOR) tablet 20 mg  20 mg Oral Daily Opyd, Lavone Neri, MD      . cloBAZam (ONFI) tablet 40 mg  40 mg Oral Daily Opyd, Lavone Neri, MD      . digoxin (LANOXIN) tablet 125 mcg  125 mcg Oral Daily Opyd, Lavone Neri, MD      . Ipratropium-Albuterol (COMBIVENT) respimat 2 puff  2 puff Inhalation BID Opyd, Lavone Neri, MD   2 puff at 11/07/20 2326  . lamoTRIgine (LAMICTAL) tablet 400 mg  400 mg Oral BID Briscoe Deutscher, MD   400 mg at 11/07/20 2327  . levETIRAcetam (KEPPRA) tablet 1,250 mg  1,250 mg Oral Daily Opyd, Lavone Neri, MD       And  . levETIRAcetam (KEPPRA) tablet 1,000 mg  1,000 mg Oral QHS Opyd, Lavone Neri, MD   1,000 mg at 11/07/20 2327  . levothyroxine (SYNTHROID) tablet 112 mcg  112 mcg Oral Q0600 Opyd, Lavone Neri, MD      . norepinephrine (LEVOPHED) 4mg  in premix infusion  2-10 mcg/min Intravenous Titrated , MD 7.5 mL/hr at 11/08/20 0833 2 mcg/min at 11/08/20 0833  . ondansetron (ZOFRAN) tablet 4 mg  4 mg Oral Q6H PRN Opyd, 11/10/20, MD       Or  . ondansetron (ZOFRAN) injection 4 mg  4 mg Intravenous Q6H PRN Opyd, Lavone Neri, MD      . pantoprazole (PROTONIX) injection 40 mg  40 mg Intravenous Q24H Opyd, Lavone Neri, MD   40 mg at 11/07/20 2324   Current Outpatient Medications  Medication Sig Dispense Refill  . acetaminophen (TYLENOL) 325 MG tablet Take 650 mg by mouth every 6 (six) hours as needed for moderate pain.    2325 amiodarone (PACERONE) 200 MG tablet Take 200 mg by mouth daily.     Marland Kitchen ammonium lactate (AMLACTIN) 12 % cream Apply 1 g topically at bedtime. Both legs/feet    . aspirin EC 81 MG tablet Take 81 mg by mouth daily.    Marland Kitchen atorvastatin (LIPITOR) 20 MG tablet Take 20 mg by mouth daily.    . bethanechol  (URECHOLINE) 10 MG tablet Take 10 mg by mouth 3 (three) times daily.     . bisacodyl (DULCOLAX) 10 MG suppository Place 10 mg rectally 2 (two) times daily as needed for moderate constipation.    . cloBAZam (ONFI) 10 MG tablet Take 40 mg by mouth daily.    . clopidogrel (PLAVIX) 75 MG tablet Take 75 mg by mouth daily.    . Cranberry 450 MG CAPS Take 450 mg by mouth daily.    . digoxin (LANOXIN) 0.125 MG tablet Take  125 mcg daily by mouth.     . escitalopram (LEXAPRO) 10 MG tablet Take 10 mg by mouth daily.    . finasteride (PROSCAR) 5 MG tablet Take 5 mg by mouth daily.    Marland Kitchen gabapentin (NEURONTIN) 300 MG capsule Take 300 mg by mouth at bedtime.    Marland Kitchen guaifenesin (HUMIBID E) 400 MG TABS tablet Take 400 mg by mouth 2 (two) times daily.    . Ipratropium-Albuterol (COMBIVENT) 20-100 MCG/ACT AERS respimat Inhale 2 puffs into the lungs 2 (two) times daily. Take 2 times daily x 5 days then every 6 hours as needed. (Patient taking differently: Inhale 2 puffs into the lungs in the morning and at bedtime.) 1 each 0  . lamoTRIgine (LAMICTAL) 200 MG tablet Take 400 mg by mouth 2 (two) times daily.    Marland Kitchen levETIRAcetam (KEPPRA) 1000 MG tablet Take 1,000 mg by mouth 2 (two) times daily. Take along with 250 mg, total 1250 mg at 9 am.    . levETIRAcetam (KEPPRA) 1000 MG tablet Take 1,000 mg by mouth at bedtime.    . levETIRAcetam (KEPPRA) 250 MG tablet Take 250 mg by mouth daily. Takes along with 1000 mg , total 1250 mg. At 9 am.    . Levothyroxine Sodium 112 MCG CAPS Take 112 mcg by mouth daily before breakfast.     . linaclotide (LINZESS) 290 MCG CAPS capsule Take 290 mcg by mouth daily.    Marland Kitchen loratadine (CLARITIN) 10 MG tablet Take 10 mg by mouth daily.    . Magnesium Hydroxide (MILK OF MAGNESIA PO) Take 30 mLs by mouth daily as needed (constipation).    . meloxicam (MOBIC) 15 MG tablet Take 15 mg by mouth daily.    . Menthol, Topical Analgesic, (BIOFREEZE) 4 % GEL Apply 1 application topically 2 (two) times  daily.    . metoprolol tartrate (LOPRESSOR) 25 MG tablet Take 12.5 mg by mouth 3 (three) times daily.     . montelukast (SINGULAIR) 10 MG tablet Take 10 mg by mouth at bedtime.    . Multiple Vitamins-Minerals (MULTIVITAMIN PO) Take 1 tablet by mouth daily.    . pantoprazole (PROTONIX) 20 MG tablet Take 20 mg by mouth daily.    . polyethylene glycol (MIRALAX / GLYCOLAX) packet Take 17 g by mouth daily.    . predniSONE (DELTASONE) 20 MG tablet Take 3 tablets (60 mg total) by mouth daily with breakfast for 3 days, THEN 2 tablets (40 mg total) daily with breakfast for 3 days, THEN 1 tablet (20 mg total) daily with breakfast for 3 days. 18 tablet 0  . sennosides-docusate sodium (SENOKOT-S) 8.6-50 MG tablet Take 2 tablets by mouth 2 (two) times daily.    . sodium fluoride (PREVIDENT 5000 PLUS) 1.1 % CREA dental cream Place 1 application onto teeth 2 (two) times daily.    . tamsulosin (FLOMAX) 0.4 MG CAPS capsule Take 0.8 mg by mouth daily after supper.    . torsemide (DEMADEX) 20 MG tablet Take 2 tablets (40 mg total) by mouth 2 (two) times daily.      Allergies as of 11/07/2020 - Review Complete 11/07/2020  Allergen Reaction Noted  . Acetaminophen Other (See Comments) 03/29/2016  . Azithromycin Other (See Comments) 03/29/2016  . Biaxin [clarithromycin] Other (See Comments) 03/29/2016  . Diltiazem Other (See Comments) 03/29/2016  . Other Other (See Comments) 10/24/2020  . Verapamil Other (See Comments) 03/29/2016    Family History  Problem Relation Age of Onset  . Hypertension Mother   .  Lung cancer Mother   . Heart attack Father   . Hypertension Father   . Hyperlipidemia Father     Social History   Socioeconomic History  . Marital status: Single    Spouse name: Not on file  . Number of children: Not on file  . Years of education: Not on file  . Highest education level: Not on file  Occupational History  . Not on file  Tobacco Use  . Smoking status: Never Smoker  . Smokeless  tobacco: Never Used  Vaping Use  . Vaping Use: Never used  Substance and Sexual Activity  . Alcohol use: No  . Drug use: No  . Sexual activity: Not on file  Other Topics Concern  . Not on file  Social History Narrative   Lives in a skilled facility      Wheelchair assisted             Social Determinants of Health   Financial Resource Strain: Not on file  Food Insecurity: Not on file  Transportation Needs: Not on file  Physical Activity: Not on file  Stress: Not on file  Social Connections: Not on file  Intimate Partner Violence: Not on file    Review of Systems: Unable to obtain secondary to dysarthria. .    OBJECTIVE:    Physical Exam: Vital signs in last 24 hours: Temp:  [97.6 F (36.4 C)-98.4 F (36.9 C)] 98 F (36.7 C) (01/18 0322) Pulse Rate:  [97-122] 97 (01/18 0730) Resp:  [12-29] 17 (01/18 0730) BP: (52-120)/(25-101) 120/93 (01/18 0730) SpO2:  [79 %-100 %] 79 % (01/18 0730)   General:   Alert  Obese male in NAD.  Head: prior craniotomy.  Psych:  Pleasant, cooperative.. Eyes:  Pupils equal, sclera clear, no icterus.   Conjunctiva pink. Ears:  Normal auditory acuity. Nose:  No deformity, discharge,  or lesions. Neck:  Supple; no masses Lungs:  Decreased breath sounds at bases.   No wheezes  Heart:  Regular rate. Right pedal edema.  Abdomen:  Soft, obese, nontender, BS active, no palp mass   Rectal:  Very fresh blood with clots expelled at time of DRE. No obvious rectal masses.   Neurologic:  Alert and oriented x4 . Right hemiparesis, dysarthria.  Skin:  Intact without significant lesions or rashes.  There were no vitals filed for this visit.   Scheduled inpatient medications . sodium chloride   Intravenous Once  . sodium chloride   Intravenous Once  . sodium chloride   Intravenous Once  . amiodarone  200 mg Oral Daily  . atorvastatin  20 mg Oral Daily  . cloBAZam  40 mg Oral Daily  . digoxin  125 mcg Oral Daily  . Ipratropium-Albuterol  2  puff Inhalation BID  . lamoTRIgine  400 mg Oral BID  . levETIRAcetam  1,250 mg Oral Daily   And  . levETIRAcetam  1,000 mg Oral QHS  . levothyroxine  112 mcg Oral Q0600  . pantoprazole (PROTONIX) IV  40 mg Intravenous Q24H      Intake/Output from previous day: 01/17 0701 - 01/18 0700 In: 6890 [I.V.:1000; Blood:1890; IV Piggyback:4000] Out: -  Intake/Output this shift: No intake/output data recorded.   Lab Results: Recent Labs    11/07/20 1508 11/07/20 2124 11/08/20 0446  WBC 8.3  --   --   HGB 11.6* 9.4* 11.2*  HCT 37.1* 30.0* 35.1*  PLT 266  --   --    BMET Recent Labs  11/07/20 1508 11/08/20 0446  NA 143 147*  K 4.5 3.8  CL 95* 107  CO2 37* 30  GLUCOSE 142* 124*  BUN 34* 41*  CREATININE 1.41* 1.49*  CALCIUM 8.4* 7.2*   LFT Recent Labs    11/08/20 0446  PROT 4.9*  ALBUMIN 2.1*  AST 21  ALT 15  ALKPHOS 60  BILITOT 2.0*   PT/INR Recent Labs    11/07/20 1508  LABPROT 13.1  INR 1.0   Hepatitis Panel No results for input(s): HEPBSAG, HCVAB, HEPAIGM, HEPBIGM in the last 72 hours.   . CBC Latest Ref Rng & Units 11/08/2020 11/07/2020 11/07/2020  WBC 4.0 - 10.5 K/uL - - 8.3  Hemoglobin 13.0 - 17.0 g/dL 11.2(L) 9.4(L) 11.6(L)  Hematocrit 39.0 - 52.0 % 35.1(L) 30.0(L) 37.1(L)  Platelets 150 - 400 K/uL - - 266    . CMP Latest Ref Rng & Units 11/08/2020 11/07/2020 11/01/2020  Glucose 70 - 99 mg/dL 295(A124(H) 213(Y142(H) 865(H143(H)  BUN 8 - 23 mg/dL 84(O41(H) 96(E34(H) 95(M46(H)  Creatinine 0.61 - 1.24 mg/dL 8.41(L1.49(H) 2.44(W1.41(H) 1.021.21  Sodium 135 - 145 mmol/L 147(H) 143 145  Potassium 3.5 - 5.1 mmol/L 3.8 4.5 4.6  Chloride 98 - 111 mmol/L 107 95(L) 107  CO2 22 - 32 mmol/L 30 37(H) 30  Calcium 8.9 - 10.3 mg/dL 7.2(L) 8.4(L) 8.8(L)  Total Protein 6.5 - 8.1 g/dL 4.9(L) 6.4(L) 6.1(L)  Total Bilirubin 0.3 - 1.2 mg/dL 2.0(H) 1.5(H) 0.6  Alkaline Phos 38 - 126 U/L 60 86 87  AST 15 - 41 U/L 21 30 25   ALT 0 - 44 U/L 15 21 22    Studies/Results: CT Angio Abd/Pel W and/or Wo  Contrast  Result Date: 11/07/2020 CLINICAL DATA:  Bloody stools, COVID-19 positivity EXAM: CTA ABDOMEN AND PELVIS WITHOUT AND WITH CONTRAST TECHNIQUE: Multidetector CT imaging of the abdomen and pelvis was performed using the standard protocol during bolus administration of intravenous contrast. Multiplanar reconstructed images and MIPs were obtained and reviewed to evaluate the vascular anatomy. CONTRAST:  100mL OMNIPAQUE IOHEXOL 350 MG/ML SOLN COMPARISON:  03/30/2016 FINDINGS: VASCULAR Aorta: Abdominal aorta demonstrates atherosclerotic calcifications without aneurysmal dilatation. No dissection is seen. Celiac: Patent without evidence of aneurysm, dissection, vasculitis or significant stenosis. SMA: Patent without evidence of aneurysm, dissection, vasculitis or significant stenosis. 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 Bilateral atherosclerotic calcifications are seen with mild stenoses in the origins of the main renal arteries. IMA: Patent Iliacs: Atherosclerotic calcifications are noted without aneurysmal dilatation or dissection. Outflow in the upper thighs is within normal limits. Veins: No specific venous abnormality is noted. Review of the MIP images confirms the above findings. NON-VASCULAR Lower chest: No acute abnormality. Hepatobiliary: No focal liver abnormality is seen. Status post cholecystectomy. No biliary dilatation. Pancreas: Unremarkable. No pancreatic ductal dilatation or surrounding inflammatory changes. Spleen: Normal in size without focal abnormality. Adrenals/Urinary Tract: Adrenal glands are within normal limits. Kidneys show no renal calculi or obstructive changes. Normal enhancement pattern is noted bilaterally. Bladder is well distended. Stomach/Bowel: The appendix is not well visualized although no inflammatory changes to suggest appendicitis are seen. No obstructive or inflammatory changes of the colon are noted. The sigmoid is significantly redundant. No findings of  acute GI hemorrhage are seen. Small bowel and stomach are within normal limits. Lymphatic: No significant vascular findings are present. No enlarged abdominal or pelvic lymph nodes. Reproductive: Prostate is unremarkable. Other: No abdominal wall hernia or abnormality. No abdominopelvic ascites. Musculoskeletal: Degenerative changes of lumbar spine are noted. Stable sclerotic  focus is noted in the L5 vertebral body on the right. IMPRESSION: VASCULAR No abdominal aortic aneurysm or dissection is noted. No findings to suggest active arterial hemorrhage are noted within the GI tract. Scattered atherosclerotic disease as described. NON-VASCULAR No acute abnormality in the abdomen and pelvis. Electronically Signed   By: Alcide Clever M.D.   On: 11/07/2020 19:44    Principal Problem:   Acute GI bleeding Active Problems:   TBI (traumatic brain injury) (HCC)   Coronary artery disease involving native coronary artery of native heart with angina pectoris (HCC)   Localization-related (focal) (partial) symptomatic epilepsy and epileptic syndromes with simple partial seizures, intractable, without status epilepticus (HCC)   Partial epilepsy with impairment of consciousness, intractable (HCC)   Typical atrial flutter (HCC)   Chronic atrial fibrillation (HCC)   COVID-19   CKD (chronic kidney disease), stage II   Chronic diastolic CHF (congestive heart failure) (HCC)   Hemorrhagic shock (HCC)    Willette Cluster, NP-C @  11/08/2020, 9:16 AM

## 2020-11-08 NOTE — ED Notes (Signed)
Patient assisted by this nurse with calling his sister, [name].

## 2020-11-08 NOTE — ED Notes (Signed)
Pt had a large BM that consisted of multiple clots of blood. This RN and Rodman Pickle, NT changed bed linen and performed pericare. Small skin tear visualized in between buttocks, pt states "that skin tear had been there for a while"

## 2020-11-09 ENCOUNTER — Encounter (HOSPITAL_COMMUNITY): Payer: Self-pay | Admitting: Internal Medicine

## 2020-11-09 ENCOUNTER — Inpatient Hospital Stay (HOSPITAL_COMMUNITY): Payer: Medicare Other

## 2020-11-09 DIAGNOSIS — D62 Acute posthemorrhagic anemia: Secondary | ICD-10-CM

## 2020-11-09 DIAGNOSIS — Z7902 Long term (current) use of antithrombotics/antiplatelets: Secondary | ICD-10-CM

## 2020-11-09 DIAGNOSIS — K633 Ulcer of intestine: Secondary | ICD-10-CM

## 2020-11-09 DIAGNOSIS — M7989 Other specified soft tissue disorders: Secondary | ICD-10-CM

## 2020-11-09 DIAGNOSIS — K921 Melena: Principal | ICD-10-CM

## 2020-11-09 LAB — CBC
HCT: 27.8 % — ABNORMAL LOW (ref 39.0–52.0)
Hemoglobin: 9.3 g/dL — ABNORMAL LOW (ref 13.0–17.0)
MCH: 30 pg (ref 26.0–34.0)
MCHC: 33.5 g/dL (ref 30.0–36.0)
MCV: 89.7 fL (ref 80.0–100.0)
Platelets: 161 10*3/uL (ref 150–400)
RBC: 3.1 MIL/uL — ABNORMAL LOW (ref 4.22–5.81)
RDW: 18.6 % — ABNORMAL HIGH (ref 11.5–15.5)
WBC: 10.2 10*3/uL (ref 4.0–10.5)
nRBC: 0.4 % — ABNORMAL HIGH (ref 0.0–0.2)

## 2020-11-09 LAB — COMPREHENSIVE METABOLIC PANEL
ALT: 13 U/L (ref 0–44)
AST: 19 U/L (ref 15–41)
Albumin: 3 g/dL — ABNORMAL LOW (ref 3.5–5.0)
Alkaline Phosphatase: 58 U/L (ref 38–126)
Anion gap: 8 (ref 5–15)
BUN: 21 mg/dL (ref 8–23)
CO2: 30 mmol/L (ref 22–32)
Calcium: 7.7 mg/dL — ABNORMAL LOW (ref 8.9–10.3)
Chloride: 115 mmol/L — ABNORMAL HIGH (ref 98–111)
Creatinine, Ser: 1.16 mg/dL (ref 0.61–1.24)
GFR, Estimated: >60 mL/min (ref >60)
Glucose, Bld: 115 mg/dL — ABNORMAL HIGH (ref 70–99)
Potassium: 2.9 mmol/L — ABNORMAL LOW (ref 3.5–5.1)
Sodium: 153 mmol/L — ABNORMAL HIGH (ref 135–145)
Total Bilirubin: 1.7 mg/dL — ABNORMAL HIGH (ref 0.3–1.2)
Total Protein: 5.4 g/dL — ABNORMAL LOW (ref 6.5–8.1)

## 2020-11-09 LAB — HEMATOCRIT: HCT: 25.5 % — ABNORMAL LOW (ref 39.0–52.0)

## 2020-11-09 LAB — HEMOGLOBIN: Hemoglobin: 8.3 g/dL — ABNORMAL LOW (ref 13.0–17.0)

## 2020-11-09 LAB — SURGICAL PATHOLOGY

## 2020-11-09 MED ORDER — SODIUM CHLORIDE 0.9% IV SOLUTION: Freq: Once | INTRAVENOUS | Status: AC

## 2020-11-09 MED ORDER — POTASSIUM CHLORIDE 20 MEQ PO PACK
40.0000 meq | PACK | ORAL | Status: AC
  Administered 2020-11-09 (×2): 40 meq via ORAL
  Filled 2020-11-09: qty 2

## 2020-11-09 MED ORDER — DEXTROSE 5 % IV SOLN: INTRAVENOUS | Status: DC

## 2020-11-09 MED ORDER — BOOST / RESOURCE BREEZE PO LIQD CUSTOM
1.0000 | Freq: Three times a day (TID) | ORAL | Status: DC
  Administered 2020-11-09 – 2020-11-10 (×5): 1 via ORAL
  Filled 2020-11-09 (×3): qty 1

## 2020-11-09 MED ORDER — CHLORHEXIDINE GLUCONATE CLOTH 2 % EX PADS: 6.0000 | MEDICATED_PAD | Freq: Every day | CUTANEOUS | Status: DC

## 2020-11-09 NOTE — Progress Notes (Signed)
Bilateral lower extremity venous duplex has been completed. Preliminary results can be found in CV Proc through chart review.   11/09/20 12:37 PM Olen Cordial RVT

## 2020-11-09 NOTE — Progress Notes (Signed)
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  He tested positive for COVID-19 on 1/3, discharged on 1/11.  He was recommended 21 days of isolation up until 1/24  Subjective / 24h Interval events: Underwent flexible sigmoidoscopy yesterday.  Still had bleeding overnight, no significant complaints for me this morning but not the best historian.  Assessment & Plan: Principal Problem Bright red blood per rectum, acute blood loss anemia, hemorrhagic shock -Patient was admitted to the hospital with sudden onset of bright red blood per rectum, unclear whether had some associated pain due to being a poor historian.  Underwent a CT angiogram x2 due to ongoing bleeding without a cause.  GI consulted and patient underwent a flexible sigmoidoscopy on 1/18 which showed congested, eroded, plate Calmer than ulcerated mucosa in the sigmoid colon with many punched out ulcers, unclear etiology whether this is atypical ischemia versus viral induced given recent Covid.  No lesions amenable to interventions.  He was also quite hypotensive requiring peripheral vasopressors and stable on that this morning.  We will try to wean off.  Received a total of 5 units of packed red blood cells, will also transfuse platelets due to platelet dysfunction in the setting of recent Plavix  Active Problems Recent COVID-19 infection -Currently no respiratory symptoms, continue isolation up until 1/24.  On room air  TBI, seizure disorder -TBI at age 56, chronic right-sided weakness and speech difficulties. Continue Keppra,  Lamictal and rest of home medications.  Family can bring helmet at anytime  Hypokalemia -Due to GI losses, replete  A. Fib/flutter -Not anticoagulated due to history of SDH and now GI bleed. Continue amiodarone, digoxin  Right leg pain, swelling -New, will obtain ultrasound.   CAD -Hold aspirin and Plavix in the setting of bleed  Chronic diastolic CHF -Currently appears euvolemic, monitor while getting fluids/blood  Chronic kidney disease stage II -Baseline about 1.2, currently at baseline  Hypernatremia -Has not been eating or drinking, but did receive fluids.  Change to the 5 water   Scheduled Meds: . sodium chloride   Intravenous Once  . sodium chloride   Intravenous Once  . sodium chloride   Intravenous Once  . sodium chloride   Intravenous Once  . amiodarone  200 mg Oral Daily  . atorvastatin  20 mg Oral Daily  . cloBAZam  40 mg Oral Daily  . digoxin  125 mcg Oral Daily  . feeding supplement  1 Container Oral TID BM  . Ipratropium-Albuterol  2 puff Inhalation BID  . lamoTRIgine  400 mg Oral BID  . levETIRAcetam  1,250 mg Oral Daily   And  . levETIRAcetam  1,000 mg Oral QHS  . levothyroxine  112 mcg Oral Q0600  . pantoprazole (PROTONIX) IV  40 mg Intravenous Q24H   Continuous Infusions: . sodium chloride 250 mL (11/08/20 0834)  . dextrose    . norepinephrine (LEVOPHED) Adult infusion 3 mcg/min (11/08/20 2345)   PRN Meds:.ondansetron **OR** ondansetron (ZOFRAN) IV  Diet Orders (From admission, onward)    Start     Ordered   11/09/20 1004  Diet full liquid  Room service appropriate? Yes; Fluid consistency: Thin  Diet effective now       Question Answer Comment  Room service appropriate? Yes   Fluid consistency: Thin      11/09/20 1003          DVT prophylaxis: SCDs Start: 11/07/20 2120     Code Status: DNR  Family Communication: d/w patient  Status is: Inpatient  Remains inpatient appropriate because:Inpatient level of care appropriate due to  severity of illness   Dispo: The patient is from: SNF              Anticipated d/c is to: SNF              Anticipated d/c date is: 3 days              Patient currently is not medically stable to d/c.  Consultants:  GI  Procedures:  None  Microbiology  None   Antimicrobials: None     Objective: Vitals:   11/09/20 0700 11/09/20 0800 11/09/20 0900 11/09/20 1000  BP: 100/66 105/65 103/63 101/69  Pulse: (!) 111 (!) 110 (!) 109 (!) 112  Resp: (!) 24 18 (!) 22 (!) 23  Temp:      TempSrc:      SpO2: 98% 97% 99% 99%  Weight:      Height:        Intake/Output Summary (Last 24 hours) at 11/09/2020 1140 Last data filed at 11/09/2020 0422 Gross per 24 hour  Intake 945 ml  Output --  Net 945 ml   Filed Weights   11/08/20 1410  Weight: (!) 147.1 kg    Examination:  Constitutional: No distress Eyes: No icterus ENMT: mmm Neck: normal, supple Respiratory: Clear bilaterally, no wheezing, no crackles, normal respiratory effort Cardiovascular: Regular rate and rhythm, no murmurs, 1+ edema on the right lower extremity Abdomen: ND, NT, bowel sounds positive Musculoskeletal: no clubbing / cyanosis.  Skin: No rashes Neurologic: No new focal deficits  Data Reviewed: I have independently reviewed following labs and imaging studies   CBC: Recent Labs  Lab 11/07/20 1508 11/07/20 2124 11/08/20 0446 11/08/20 1319 11/09/20 0045 11/09/20 0614  WBC 8.3  --   --  9.4  --  10.2  NEUTROABS 7.0  --   --   --   --   --   HGB 11.6* 9.4* 11.2* 7.5* 8.3* 9.3*  HCT 37.1* 30.0* 35.1* 23.1* 25.5* 27.8*  MCV 93.9  --   --  92.8  --  89.7  PLT 266  --   --  164  --  161   Basic Metabolic Panel: Recent Labs  Lab 11/07/20 1508 11/08/20 0446 11/09/20 0614  NA 143 147* 153*  K 4.5 3.8 2.9*  CL 95* 107 115*  CO2 37* 30 30  GLUCOSE 142* 124* 115*  BUN 34* 41* 21  CREATININE 1.41* 1.49* 1.16  CALCIUM 8.4* 7.2* 7.7*   Liver Function Tests: Recent Labs  Lab 11/07/20 1508  11/08/20 0446 11/09/20 0614  AST 30 21 19   ALT 21 15 13   ALKPHOS 86 60 58  BILITOT 1.5* 2.0* 1.7*  PROT 6.4* 4.9* 5.4*  ALBUMIN 2.6* 2.1* 3.0*   Coagulation Profile: Recent Labs  Lab 11/07/20 1508  INR 1.0   HbA1C: No results for input(s): HGBA1C in the last 72 hours. CBG: No results for input(s): GLUCAP in the last 168 hours.  No results found for this or any previous visit (from the past 240  hour(s)).   Radiology Studies: No results found.  Pamella Pert, MD, PhD [facility]  Between 7 am - 7 pm I am available, please contact me via Amion or Securechat  Between 7 pm - 7 am I am not available, please contact night coverage MD/APP via Amion

## 2020-11-09 NOTE — ED Notes (Signed)
Blood bank has platelets ready for this patient. Notified Mike,RN.

## 2020-11-09 NOTE — ED Notes (Signed)
Patient cleaned up and linens changed. Patient again had bright red bloody stool.

## 2020-11-09 NOTE — ED Notes (Signed)
Sister wants to know when she should bring patients helmet. He is supposed to wear it all the time.

## 2020-11-09 NOTE — Progress Notes (Signed)
GI Progress Note  Chief Complaint: Hematochezia  History:  I reviewed Dr. Marvell FullerGessner's consult and sigmoidoscopy reports from yesterday, as well as other notes and records in this patient's chart related to this hospitalization. Nursing reports more bright red blood per rectum about 3 hours ago.  Patient cannot give any history or helpful review of systems, he seems to deny abdominal pain.  He is hungry however.  ROS: Patient seems to deny chest pain or dyspnea.  Otherwise additional review of systems cannot be obtained due to his mental status  Objective:   Current Facility-Administered Medications:  .  0.9 %  sodium chloride infusion (Manually program via Guardrails IV Fluids), , Intravenous, Once, Iva BoopGessner, Carl E, MD, Held at 11/07/20 1815 .  0.9 %  sodium chloride infusion (Manually program via Guardrails IV Fluids), , Intravenous, Once, Iva BoopGessner, Carl E, MD, Held at 11/07/20 2308 .  0.9 %  sodium chloride infusion (Manually program via Guardrails IV Fluids), , Intravenous, Once, Iva BoopGessner, Carl E, MD, Held at 11/08/20 0124 .  0.9 %  sodium chloride infusion (Manually program via Guardrails IV Fluids), , Intravenous, Once, Gherghe, Costin M, MD .  0.9 %  sodium chloride infusion, 250 mL, Intravenous, Continuous, Iva BoopGessner, Carl E, MD, Last Rate: 20 mL/hr at 11/08/20 0834, 250 mL at 11/08/20 0834 .  amiodarone (PACERONE) tablet 200 mg, 200 mg, Oral, Daily, Iva BoopGessner, Carl E, MD .  atorvastatin (LIPITOR) tablet 20 mg, 20 mg, Oral, Daily, Iva BoopGessner, Carl E, MD .  cloBAZam (ONFI) tablet 40 mg, 40 mg, Oral, Daily, Iva BoopGessner, Carl E, MD .  digoxin (LANOXIN) tablet 125 mcg, 125 mcg, Oral, Daily, Iva BoopGessner, Carl E, MD .  Ipratropium-Albuterol (COMBIVENT) respimat 2 puff, 2 puff, Inhalation, BID, Iva BoopGessner, Carl E, MD, 2 puff at 11/09/20 [id number] .  lamoTRIgine (LAMICTAL) tablet 400 mg, 400 mg, Oral, BID, Iva BoopGessner, Carl E, MD, 400 mg at 11/08/20 2225 .  levETIRAcetam (KEPPRA) tablet 1,250 mg, 1,250 mg, Oral,  Daily **AND** levETIRAcetam (KEPPRA) tablet 1,000 mg, 1,000 mg, Oral, QHS, Iva BoopGessner, Carl E, MD, 1,000 mg at 11/08/20 2226 .  levothyroxine (SYNTHROID) tablet 112 mcg, 112 mcg, Oral, Q0600, Iva BoopGessner, Carl E, MD, 112 mcg at 11/09/20 0600 .  norepinephrine (LEVOPHED) 4mg  in 250mL premix infusion, 2-10 mcg/min, Intravenous, Titrated, Iva BoopGessner, Carl E, MD, Last Rate: 11.25 mL/hr at 11/08/20 2345, 3 mcg/min at 11/08/20 2345 .  ondansetron (ZOFRAN) tablet 4 mg, 4 mg, Oral, Q6H PRN **OR** ondansetron (ZOFRAN) injection 4 mg, 4 mg, Intravenous, Q6H PRN, Iva BoopGessner, Carl E, MD .  pantoprazole (PROTONIX) injection 40 mg, 40 mg, Intravenous, Q24H, Iva BoopGessner, Carl E, MD, 40 mg at 11/08/20 1906 .  potassium chloride (KLOR-CON) packet 40 mEq, 40 mEq, Oral, Q3H, Leatha GildingGherghe, Costin M, MD, 40 mEq at 11/09/20 0851  Current Outpatient Medications:  .  acetaminophen (TYLENOL) 325 MG tablet, Take 650 mg by mouth every 6 (six) hours as needed for moderate pain., Disp: , Rfl:  .  amiodarone (PACERONE) 200 MG tablet, Take 200 mg by mouth daily. , Disp: , Rfl:  .  ammonium lactate (AMLACTIN) 12 % cream, Apply 1 g topically at bedtime. Both legs/feet, Disp: , Rfl:  .  aspirin EC 81 MG tablet, Take 81 mg by mouth daily., Disp: , Rfl:  .  atorvastatin (LIPITOR) 20 MG tablet, Take 20 mg by mouth daily., Disp: , Rfl:  .  bethanechol (URECHOLINE) 10 MG tablet, Take 10 mg by mouth 3 (three) times daily. , Disp: , Rfl:  .  bisacodyl (  DULCOLAX) 10 MG suppository, Place 10 mg rectally 2 (two) times daily as needed for moderate constipation., Disp: , Rfl:  .  cloBAZam (ONFI) 10 MG tablet, Take 40 mg by mouth daily., Disp: , Rfl:  .  clopidogrel (PLAVIX) 75 MG tablet, Take 75 mg by mouth daily., Disp: , Rfl:  .  Cranberry 450 MG CAPS, Take 450 mg by mouth daily., Disp: , Rfl:  .  digoxin (LANOXIN) 0.125 MG tablet, Take 125 mcg daily by mouth. , Disp: , Rfl:  .  escitalopram (LEXAPRO) 10 MG tablet, Take 10 mg by mouth daily., Disp: , Rfl:  .   finasteride (PROSCAR) 5 MG tablet, Take 5 mg by mouth daily., Disp: , Rfl:  .  gabapentin (NEURONTIN) 300 MG capsule, Take 300 mg by mouth at bedtime., Disp: , Rfl:  .  guaifenesin (HUMIBID E) 400 MG TABS tablet, Take 400 mg by mouth 2 (two) times daily., Disp: , Rfl:  .  Ipratropium-Albuterol (COMBIVENT) 20-100 MCG/ACT AERS respimat, Inhale 2 puffs into the lungs 2 (two) times daily. Take 2 times daily x 5 days then every 6 hours as needed. (Patient taking differently: Inhale 2 puffs into the lungs in the morning and at bedtime.), Disp: 1 each, Rfl: 0 .  lamoTRIgine (LAMICTAL) 200 MG tablet, Take 400 mg by mouth 2 (two) times daily., Disp: , Rfl:  .  levETIRAcetam (KEPPRA) 1000 MG tablet, Take 1,000 mg by mouth 2 (two) times daily. Take along with 250 mg, total 1250 mg at 9 am., Disp: , Rfl:  .  levETIRAcetam (KEPPRA) 1000 MG tablet, Take 1,000 mg by mouth at bedtime., Disp: , Rfl:  .  levETIRAcetam (KEPPRA) 250 MG tablet, Take 250 mg by mouth daily. Takes along with 1000 mg , total 1250 mg. At 9 am., Disp: , Rfl:  .  Levothyroxine Sodium 112 MCG CAPS, Take 112 mcg by mouth daily before breakfast. , Disp: , Rfl:  .  linaclotide (LINZESS) 290 MCG CAPS capsule, Take 290 mcg by mouth daily., Disp: , Rfl:  .  loratadine (CLARITIN) 10 MG tablet, Take 10 mg by mouth daily., Disp: , Rfl:  .  Magnesium Hydroxide (MILK OF MAGNESIA PO), Take 30 mLs by mouth daily as needed (constipation)., Disp: , Rfl:  .  meloxicam (MOBIC) 15 MG tablet, Take 15 mg by mouth daily., Disp: , Rfl:  .  Menthol, Topical Analgesic, (BIOFREEZE) 4 % GEL, Apply 1 application topically 2 (two) times daily., Disp: , Rfl:  .  metoprolol tartrate (LOPRESSOR) 25 MG tablet, Take 12.5 mg by mouth 3 (three) times daily. , Disp: , Rfl:  .  montelukast (SINGULAIR) 10 MG tablet, Take 10 mg by mouth at bedtime., Disp: , Rfl:  .  Multiple Vitamins-Minerals (MULTIVITAMIN PO), Take 1 tablet by mouth daily., Disp: , Rfl:  .  pantoprazole (PROTONIX)  20 MG tablet, Take 20 mg by mouth daily., Disp: , Rfl:  .  polyethylene glycol (MIRALAX / GLYCOLAX) packet, Take 17 g by mouth daily., Disp: , Rfl:  .  predniSONE (DELTASONE) 20 MG tablet, Take 3 tablets (60 mg total) by mouth daily with breakfast for 3 days, THEN 2 tablets (40 mg total) daily with breakfast for 3 days, THEN 1 tablet (20 mg total) daily with breakfast for 3 days., Disp: 18 tablet, Rfl: 0 .  sennosides-docusate sodium (SENOKOT-S) 8.6-50 MG tablet, Take 2 tablets by mouth 2 (two) times daily., Disp: , Rfl:  .  sodium fluoride (PREVIDENT 5000 PLUS) 1.1 % CREA  dental cream, Place 1 application onto teeth 2 (two) times daily., Disp: , Rfl:  .  tamsulosin (FLOMAX) 0.4 MG CAPS capsule, Take 0.8 mg by mouth daily after supper., Disp: , Rfl:  .  torsemide (DEMADEX) 20 MG tablet, Take 2 tablets (40 mg total) by mouth 2 (two) times daily., Disp: , Rfl:   . sodium chloride 250 mL (11/08/20 0834)  . norepinephrine (LEVOPHED) Adult infusion 3 mcg/min (11/08/20 2345)     Vital signs in last 24 hrs: Vitals:   11/09/20 0800 11/09/20 0900  BP: 105/65 103/63  Pulse: (!) 110 (!) 109  Resp: 18 (!) 22  Temp:    SpO2: 97% 99%    Intake/Output Summary (Last 24 hours) at 11/09/2020 0955 Last data filed at 11/09/2020 0422 Gross per 24 hour  Intake 945 ml  Output -  Net 945 ml     Physical Exam Postsurgical altered cranial anatomy  HEENT: sclera anicteric, oral mucosa dry with poor dentition  Neck: supple, no thyromegaly,JVD or lymphadenopathy  Cardiac: RRR with split S2, + marked peripheral edema  Pulm: clear to auscultation bilaterally, normal RR and fair effort noted  Abdomen: soft, mild scattered tenderness, with active bowel sounds. No guarding or palpable hepatosplenomegaly no limited by body habitus  Skin; warm and dry, pale, no jaundice  Recent Labs:  CBC Latest Ref Rng & Units 11/09/2020 11/09/2020 11/08/2020  WBC 4.0 - 10.5 K/uL 10.2 - 9.4  Hemoglobin 13.0 - 17.0 g/dL  1.7(G) 8.3(L) 7.5(L)  Hematocrit 39.0 - 52.0 % 27.8(L) 25.5(L) 23.1(L)  Platelets 150 - 400 K/uL 161 - 164  Most recent labs were at 615 this morning, shortly before around the time of his bleeding.  Recent Labs  Lab 11/07/20 1508  INR 1.0   CMP Latest Ref Rng & Units 11/09/2020 11/08/2020 11/07/2020  Glucose 70 - 99 mg/dL 017(C) 944(H) 675(F)  BUN 8 - 23 mg/dL 21 16(B) 84(Y)  Creatinine 0.61 - 1.24 mg/dL 6.59 9.35(T) 0.17(B)  Sodium 135 - 145 mmol/L 153(H) 147(H) 143  Potassium 3.5 - 5.1 mmol/L 2.9(L) 3.8 4.5  Chloride 98 - 111 mmol/L 115(H) 107 95(L)  CO2 22 - 32 mmol/L 30 30 37(H)  Calcium 8.9 - 10.3 mg/dL 7.7(L) 7.2(L) 8.4(L)  Total Protein 6.5 - 8.1 g/dL 9.3(J) 4.9(L) 6.4(L)  Total Bilirubin 0.3 - 1.2 mg/dL 0.3(E) 2.0(H) 1.5(H)  Alkaline Phos 38 - 126 U/L 58 60 86  AST 15 - 41 U/L 19 21 30   ALT 0 - 44 U/L 13 15 21      Radiologic studies:   Assessment & Plan  Assessment: Hematochezia Acute blood loss anemia Multiple sigmoid colon ulcers of unclear cause, due to the cause of bleeding seen on sigmoidoscopy yesterday.  Clinical suspicion is perhaps ischemic or related to recent COVID infection. Multiple other medical comorbidities.  Patient is on Plavix, last dose within the last 3 days.  He has ongoing bleeding, his blood pressure is stable, he remains tachycardic at about 100 at time of my evaluation.  Per Dr. plan, platelets will be administered because he has medication induced platelet dysfunction with recent Plavix.  These sigmoid findings are not amenable to endoscopic hemostasis since there were multiple large ulcers.  He needs better intrinsic hemostasis if that can be provided with adequate platelet function. (I discussed this with Dr. , who will manage the platelet transfusion and discussed with blood bank about this.  Multiple blood products have reportedly in short supply these days.)  Ongoing supportive  care needed.  He is currently on a clear  liquid diet, I will increase to full liquids and give him liquid protein calorie supplements as well.  Total time 35 minutes, extensive chart review and discussion with care team necessary.  Medically complex patient.  Charlie Pitter III Office: [phone]

## 2020-11-09 NOTE — ED Notes (Signed)
Sister, Mehtab Dolberry, [phone] would like the nurse to call her with an update.

## 2020-11-09 NOTE — ED Notes (Signed)
Patient cleaned up and new primofit placed. Patient had a moderate amount of bright red, bloody stool.

## 2020-11-10 DIAGNOSIS — K559 Vascular disorder of intestine, unspecified: Secondary | ICD-10-CM

## 2020-11-10 LAB — PREPARE PLATELET PHERESIS: Unit division: 0

## 2020-11-10 LAB — TYPE AND SCREEN
ABO/RH(D): A POS
Antibody Screen: NEGATIVE
Unit division: 0
Unit division: 0
Unit division: 0
Unit division: 0
Unit division: 0

## 2020-11-10 LAB — CBC
HCT: 25.6 % — ABNORMAL LOW (ref 39.0–52.0)
HCT: 26.7 % — ABNORMAL LOW (ref 39.0–52.0)
Hemoglobin: 8.2 g/dL — ABNORMAL LOW (ref 13.0–17.0)
Hemoglobin: 8.7 g/dL — ABNORMAL LOW (ref 13.0–17.0)
MCH: 30.1 pg (ref 26.0–34.0)
MCH: 29.9 pg (ref 26.0–34.0)
MCHC: 32 g/dL (ref 30.0–36.0)
MCHC: 32.6 g/dL (ref 30.0–36.0)
MCV: 94.1 fL (ref 80.0–100.0)
MCV: 91.8 fL (ref 80.0–100.0)
Platelets: 169 10*3/uL (ref 150–400)
Platelets: 152 10*3/uL (ref 150–400)
RBC: 2.72 MIL/uL — ABNORMAL LOW (ref 4.22–5.81)
RBC: 2.91 MIL/uL — ABNORMAL LOW (ref 4.22–5.81)
RDW: 19.2 % — ABNORMAL HIGH (ref 11.5–15.5)
RDW: 18.6 % — ABNORMAL HIGH (ref 11.5–15.5)
WBC: 6.3 10*3/uL (ref 4.0–10.5)
WBC: 7.6 10*3/uL (ref 4.0–10.5)
nRBC: 0.9 % — ABNORMAL HIGH (ref 0.0–0.2)
nRBC: 0.7 % — ABNORMAL HIGH (ref 0.0–0.2)

## 2020-11-10 LAB — BPAM RBC
Blood Product Expiration Date: 202202012359
Blood Product Expiration Date: 202202012359
Blood Product Expiration Date: 202202012359
Blood Product Expiration Date: 202202022359
Blood Product Expiration Date: 202202142359
ISSUE DATE / TIME: 202201171844
ISSUE DATE / TIME: 202201180109
ISSUE DATE / TIME: 202201180206
ISSUE DATE / TIME: 202201181739
ISSUE DATE / TIME: 202201190049
Unit Type and Rh: 6200
Unit Type and Rh: 6200
Unit Type and Rh: 6200
Unit Type and Rh: 6200
Unit Type and Rh: 6200

## 2020-11-10 LAB — BPAM PLATELET PHERESIS
Blood Product Expiration Date: 202201202359
ISSUE DATE / TIME: 202201191412
Unit Type and Rh: 7300

## 2020-11-10 LAB — COMPREHENSIVE METABOLIC PANEL
ALT: 16 U/L (ref 0–44)
AST: 29 U/L (ref 15–41)
Albumin: 2.7 g/dL — ABNORMAL LOW (ref 3.5–5.0)
Alkaline Phosphatase: 57 U/L (ref 38–126)
Anion gap: 4 — ABNORMAL LOW (ref 5–15)
BUN: 16 mg/dL (ref 8–23)
CO2: 31 mmol/L (ref 22–32)
Calcium: 7.9 mg/dL — ABNORMAL LOW (ref 8.9–10.3)
Chloride: 114 mmol/L — ABNORMAL HIGH (ref 98–111)
Creatinine, Ser: 1.18 mg/dL (ref 0.61–1.24)
GFR, Estimated: >60 mL/min (ref >60)
Glucose, Bld: 95 mg/dL (ref 70–99)
Potassium: 3.2 mmol/L — ABNORMAL LOW (ref 3.5–5.1)
Sodium: 149 mmol/L — ABNORMAL HIGH (ref 135–145)
Total Bilirubin: 1.3 mg/dL — ABNORMAL HIGH (ref 0.3–1.2)
Total Protein: 5.5 g/dL — ABNORMAL LOW (ref 6.5–8.1)

## 2020-11-10 LAB — MRSA PCR SCREENING: MRSA by PCR: POSITIVE — AB

## 2020-11-10 MED ORDER — POTASSIUM CHLORIDE CRYS ER 20 MEQ PO TBCR
40.0000 meq | EXTENDED_RELEASE_TABLET | Freq: Once | ORAL | Status: AC
  Administered 2020-11-10: 40 meq via ORAL
  Filled 2020-11-10: qty 2

## 2020-11-10 MED ORDER — NOREPINEPHRINE 4 MG/250ML-% IV SOLN: 2.0000 ug/min | INTRAVENOUS | Status: DC

## 2020-11-10 MED ORDER — ENSURE ENLIVE PO LIQD
237.0000 mL | Freq: Two times a day (BID) | ORAL | Status: DC
  Administered 2020-11-10 – 2020-11-15 (×10): 237 mL via ORAL

## 2020-11-10 MED ORDER — TRAMADOL HCL 50 MG PO TABS
25.0000 mg | ORAL_TABLET | Freq: Four times a day (QID) | ORAL | Status: DC | PRN
  Administered 2020-11-10 – 2020-11-14 (×3): 25 mg via ORAL
  Filled 2020-11-10 (×3): qty 1

## 2020-11-10 MED ORDER — BOOST / RESOURCE BREEZE PO LIQD CUSTOM
1.0000 | ORAL | Status: DC
  Administered 2020-11-11 – 2020-11-14 (×4): 1 via ORAL

## 2020-11-10 MED ORDER — PROSOURCE PLUS PO LIQD
30.0000 mL | Freq: Every day | ORAL | Status: DC
  Administered 2020-11-10 – 2020-11-14 (×5): 30 mL via ORAL
  Filled 2020-11-10 (×4): qty 30

## 2020-11-10 MED ORDER — SODIUM CHLORIDE 0.9 % IV SOLN: 250.0000 mL | INTRAVENOUS | Status: DC

## 2020-11-10 MED ORDER — MUPIROCIN 2 % EX OINT
1.0000 "application " | TOPICAL_OINTMENT | Freq: Two times a day (BID) | CUTANEOUS | Status: AC
  Administered 2020-11-10 – 2020-11-14 (×9): 1 via NASAL
  Filled 2020-11-10 (×3): qty 22

## 2020-11-10 MED ORDER — CHLORHEXIDINE GLUCONATE CLOTH 2 % EX PADS
6.0000 | MEDICATED_PAD | Freq: Every day | CUTANEOUS | Status: AC
  Administered 2020-11-09 – 2020-11-14 (×5): 6 via TOPICAL

## 2020-11-10 MED ORDER — ORAL CARE MOUTH RINSE
15.0000 mL | Freq: Two times a day (BID) | OROMUCOSAL | Status: DC
  Administered 2020-11-10 – 2020-11-15 (×10): 15 mL via OROMUCOSAL

## 2020-11-10 NOTE — Progress Notes (Signed)
Patient noted to have low BP's today. Pt asymptomatic, no sign of distress. MD Gherghe notified to possibly restart pt back on pressors. Will continue to monitor.

## 2020-11-10 NOTE — Progress Notes (Signed)
Noted ST depression on tele monitor. EKG done and results completed . MD Gherghe notified. Pt does not appear in distress and does not complain of chest pain.  Will continue to monitor.

## 2020-11-10 NOTE — Evaluation (Signed)
Occupational Therapy Evaluation Patient Details Name: Austin Hartman MRN: [id number] DOB: 1951/05/10 Today's Date: 11/10/2020    History of Present Illness 70 year old male with history of TBI, right hemiplegia, chronic dysphagia, CAD, A. fib/flutter not on anticoagulation due to SDH history, chronic diastolic CHF, chronic kidney disease stage II, epilepsy, admitted earlier this month with COVID-19 and hypoxia came back to the ED from his SNF due to rectal bleeding.  He tested positive for COVID-19 on 1/3, discharged back to SNF on 1/11.  He was recommended 21 days of isolation up until 1/24   Clinical Impression   [name] is a 70 year old man who presents with right sided hemiparesis, generalized weakness, edema and pain in RLE, decreased activity tolerance, and presumed impaired balance resulting in a decline in baseline abilities. Per chart patient able to transfer to wc and maneuver wc in facility, assist with transfers and ADLs prior to his original COVID infection. Patient reports not being out of bed since returning to SNF. On evaluation - which was limited to bed eval due to malfunctioning rectal bleeding - patient mod assist for rolling left and right, +2 for safety, total care for pericare care, lower body dressing and bathing and max assist for UB ADLs. Patient will benefit from skilled OT services while in hospital to improve deficits and improve functional abilities in order to reduce caregiver burden. Recommend return to LTC at discharge.    Follow Up Recommendations  SNF    Equipment Recommendations  None recommended by OT    Recommendations for Other Services       Precautions / Restrictions Precautions Precautions: Fall Precaution Comments: seizure, craniotomy (needs helmet) Restrictions Weight Bearing Restrictions: No      Mobility Bed Mobility Overal bed mobility: Needs Assistance Bed Mobility: Rolling Rolling: Mod assist;+2 for safety/equipment          General bed mobility comments: Able to roll left and right for pericare and bed pad change. Patient able to assist with use of bed rails. Mobility limited by rectal bleeding during evaluation.    Transfers                      Balance                                           ADL either performed or assessed with clinical judgement   ADL Overall ADL's : Needs assistance/impaired Eating/Feeding: Set up;Minimal assistance;Bed level Eating/Feeding Details (indicate cue type and reason): able to grossly bring cup to mouth with left hand but did demonstrate some difficulty. Grooming: Set up;Wash/dry face;Bed level   Upper Body Bathing: Set up;Maximal assistance;Bed level   Lower Body Bathing: Total assistance;Bed level   Upper Body Dressing : Maximal assistance;Bed level   Lower Body Dressing: Total assistance;+2 for physical assistance;+2 for safety/equipment;Bed level     Toilet Transfer Details (indicate cue type and reason): unable Toileting- Clothing Manipulation and Hygiene: Total assistance;+2 for physical assistance;+2 for safety/equipment;Bed level Toileting - Clothing Manipulation Details (indicate cue type and reason): Patient rolled left and right for pericare and bed pad change. Bed pad red with blookd. Rn notified.   Tub/Shower Transfer Details (indicate cue type and reason): unable         Vision   Vision Assessment?: No apparent visual deficits     Perception  Praxis      Pertinent Vitals/Pain Pain Assessment: Faces Faces Pain Scale: Hurts whole lot Pain Location: RLE Pain Descriptors / Indicators: Discomfort;Grimacing;Moaning Pain Intervention(s): Monitored during session;Limited activity within patient's tolerance;Repositioned     Hand Dominance Left (Uses L hand for feeding)   Extremity/Trunk Assessment Upper Extremity Assessment Upper Extremity Assessment: RUE deficits/detail;LUE deficits/detail RUE Deficits /  Details: Able to grossly raise shoulder to 90 degrees, elbow strength 4/5, tricep strength 4/5, able to extend wrist to neutral (strength not tested), able to open and close fingers RUE Sensation:  (able to feel pain and discomfort) RUE Coordination: decreased gross motor;decreased fine motor LUE Deficits / Details: Shoulder 3+/5 strength, 4/5 elbow strength, 5/5 wrist strength, 4-/5 grip LUE Sensation: WNL LUE Coordination: WNL   Lower Extremity Assessment Lower Extremity Assessment: Defer to PT evaluation       Communication Communication Communication: Expressive difficulties   Cognition Arousal/Alertness: Awake/alert Behavior During Therapy: WFL for tasks assessed/performed Overall Cognitive Status: Difficult to assess                                 General Comments: Able to follow commands. Needs questions repeated quite often. Verbilizations limited predominantly one word answers but can string a larger sentence together at times.   General Comments       Exercises     Shoulder Instructions      Home Living Family/patient expects to be discharged to:: Skilled nursing facility                                        Prior Functioning/Environment Level of Independence: Needs assistance  Gait / Transfers Assistance Needed: It is reported that patient can typically transfer to wc and manuever wc around facility prior to initial COVID infection. However, reports that he has not been out of bed since returning to SNF. ADL's / Homemaking Assistance Needed: needs assistance for all ADLs. Able to feed himself with set up and assistance with grooming. Communication / Swallowing Assistance Needed: Expressive difficulties. Typically answers with Yes or NO          OT Problem List: Decreased strength;Decreased range of motion;Decreased activity tolerance;Impaired balance (sitting and/or standing);Decreased coordination;Decreased safety  awareness;Decreased knowledge of use of DME or AE;Decreased knowledge of precautions;Obesity;Increased edema;Impaired tone      OT Treatment/Interventions: Self-care/ADL training;Therapeutic exercise;Neuromuscular education;DME and/or AE instruction;Therapeutic activities;Balance training;Patient/family education    OT Goals(Current goals can be found in the care plan section) Acute Rehab OT Goals OT Goal Formulation: Patient unable to participate in goal setting Time For Goal Achievement: 11/24/20 Potential to Achieve Goals: Fair  OT Frequency: Min 2X/week   Barriers to D/C:            Co-evaluation PT/OT/SLP Co-Evaluation/Treatment: Yes Reason for Co-Treatment: For patient/therapist safety;To address functional/ADL transfers   OT goals addressed during session: ADL's and self-care      AM-PAC OT "6 Clicks" Daily Activity     Outcome Measure Help from another person eating meals?: A Little Help from another person taking care of personal grooming?: A Little Help from another person toileting, which includes using toliet, bedpan, or urinal?: Total Help from another person bathing (including washing, rinsing, drying)?: A Lot Help from another person to put on and taking off regular upper body clothing?: A Lot Help from another person  to put on and taking off regular lower body clothing?: Total 6 Click Score: 12   End of Session Nurse Communication: Mobility status;Need for lift equipment  Activity Tolerance: Patient tolerated treatment well Patient left: in bed;with call bell/phone within reach;with nursing/sitter in room  OT Visit Diagnosis: Muscle weakness (generalized) (M62.81);Hemiplegia and hemiparesis Hemiplegia - Right/Left: Right Hemiplegia - dominant/non-dominant: Dominant Hemiplegia - caused by: Cerebral infarction                Time: 2542-7062 OT Time Calculation (min): 27 min Charges:  OT General Charges $OT Visit: 1 Visit OT Evaluation $OT Eval Moderate  Complexity: 1 Mod  Vonda, OTR/L Acute Care Rehab Services  Office [phone] Pager: [phone]   Kelli Churn 11/10/2020, 12:12 PM

## 2020-11-10 NOTE — TOC Initial Note (Signed)
Transition of Care [facility]) - Initial/Assessment Note    Patient Details  Name: Austin Hartman MRN: [id number] Date of Birth: 06-06-1951  Transition of Care [facility]) CM/SW Contact:    Golda Acre, RN Phone Number: 11/10/2020, 8:37 AM  Clinical Narrative:                  70 y.o. 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  The patient was discharged from hospital on 11/01/2020 on room air, was reportedly doing fairly well at the nursing facility until he was noted to have blood in his diaper this afternoon.  Patient does not have any complaints in the ED but his family notes that he often has difficulty verbalizing when he does have pain or discomfort.  ED Course: Upon arrival to the ED, patient is found to be afebrile, saturating mid 90s on room air, tachycardic in the 110s, and with blood pressure as low as 80/60.  Chemistry panel is notable for BUN of 34 and creatinine 1.41.  CBC notable for slight and stable normocytic anemia.  Fecal occult blood test is positive.  He has had large volume of maroon blood and clots per rectum in ED.  CTA abdomen and pelvis is negative for acute nonvascular findings and negative for AAA, dissection, or active arterial hemorrhage.  Gastroenterology was consulted by the ED physician and the patient was given a liter of saline and 1 unit RBC. PLAN: TO RETURN TO [facility] IN [place]  Expected Discharge Plan: Skilled Nursing Facility Barriers to Discharge: Continued Medical Work up   Patient Goals and CMS Choice Patient states their goals for this hospitalization and ongoing recovery are:: UNABLE TO STATE CMS Medicare.gov Compare Post Acute  Care list provided to:: Patient    Expected Discharge Plan and Services Expected Discharge Plan: Skilled Nursing Facility   Discharge Planning Services: CM Consult   Living arrangements for the past 2 months: Skilled Nursing Facility                                      Prior Living Arrangements/Services Living arrangements for the past 2 months: Skilled Nursing Facility Lives with:: Facility Resident Patient language and need for interpreter reviewed:: Yes        Need for Family Participation in Patient Care: Yes (Comment) Care giver support system in place?: Yes (comment)   Criminal Activity/Legal Involvement Pertinent to Current Situation/Hospitalization: No - Comment as needed  Activities of Daily Living Home Assistive Devices/Equipment: TEFL teacher (Comment) (right ankle brace, wears helmet to protect head in case he falls while having a seizure) ADL Screening (condition at time of admission) Patient's cognitive ability adequate to safely complete daily activities?: No Is the patient deaf or have difficulty hearing?: No Does the patient have difficulty seeing, even when wearing glasses/contacts?: No Does the patient have difficulty concentrating, remembering, or making decisions?: No Patient able to express need for assistance with ADLs?: No Does the patient have difficulty dressing or bathing?: Yes Independently performs ADLs?: No Communication: Independent Dressing (OT): Needs assistance Is this a change from baseline?: Pre-admission baseline Grooming: Independent Feeding: Independent Bathing: Needs assistance Is this a  change from baseline?: Pre-admission baseline Toileting: Needs assistance Is this a change from baseline?: Pre-admission baseline In/Out Bed: Needs assistance Is this a change from baseline?: Pre-admission baseline Walks in Home: Dependent Is this a change from baseline?: Pre-admission baseline Does the patient have difficulty walking  or climbing stairs?: Yes Weakness of Legs: Right (hx of stroke that affected his right side) Weakness of Arms/Hands: Right (hx of stroke that affected his right side)  Permission Sought/Granted                  Emotional Assessment Appearance:: Appears stated age Attitude/Demeanor/Rapport: Unable to Assess Affect (typically observed): Unable to Assess Orientation: : Fluctuating Orientation (Suspected and/or reported Sundowners) Alcohol / Substance Use: Other (comment) Psych Involvement: Outpatient Provider  Admission diagnosis:  Rectal bleeding [K62.5] Acute GI bleeding [K92.2] Lower GI bleed [K92.2] Patient Active Problem List   Diagnosis Date Noted  . Hemorrhagic shock (HCC) 11/08/2020  . Colitis   . Acute GI bleeding 11/07/2020  . 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 Chronic diastolic CHF (congestive heart failure) (HCC) 11/07/2020  . Pressure injury of skin 10/26/2020  . Abdominal distention   . Acute respiratory failure with hypoxia (HCC)   . ARF (acute renal failure) (HCC)   . COVID-19   . On amiodarone therapy 01/21/2018  . Chronic atrial fibrillation (HCC) 06/06/2017  . High risk medication use 06/06/2017  . Subdural hematoma, chronic (HCC) 06/06/2017  . 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 Typical atrial flutter (HCC) 05/07/2016  . Chronic constipation 03/29/2016  . Altered mental state 03/29/2016  . Epilepsy (HCC) 03/29/2016  . Abdominal pain, generalized 03/29/2016  . Hypotension, unspecified 03/29/2016  . Left hemiparesis (HCC) 03/29/2016  . At high risk for aspiration 03/29/2016  . DNR (do not resuscitate) 03/29/2016  . TBI (traumatic brain injury) (HCC)   . Dysphagia   . 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 Diarrhea   . Hypertensive  heart disease with heart failure (HCC)   . History of traumatic brain injury   . Hyperlipidemia 06/30/2015  . Hemiplegia (HCC) 12/13/2011  . Partial epilepsy with impairment of consciousness, intractable (HCC) 12/13/2011   PCP:  Galvin Proffer, MD Pharmacy:   [facility] - [place], [place] - [address] [place] [address] [place] [address] Phone: [phone] Fax: [phone]     Social Determinants of Health (SDOH) Interventions    Readmission Risk Interventions No flowsheet data found.

## 2020-11-10 NOTE — Evaluation (Signed)
Physical Therapy Evaluation Patient Details Name: Austin Hartman MRN: [id number] DOB: Aug 30, 1951 Today's Date: 11/10/2020   History of Present Illness  70 year old male with history of TBI, right hemiplegia, chronic dysphagia, CAD, A. fib/flutter not on anticoagulation due to SDH history, chronic diastolic CHF, chronic kidney disease stage II, epilepsy, admitted earlier this month with COVID-19 and hypoxia came back to the ED from his SNF due to rectal bleeding.  He tested positive for COVID-19 on 1/3, discharged back to SNF on 1/11.  He was recommended 21 days of isolation up until 1/24  Clinical Impression  Austin Hartman is a 70 year old man who presents with right sided hemiparesis, generalized weakness, edema and pain in RLE, decreased activity tolerance, and presumed impaired balance resulting in a decline in baseline abilities. Per chart patient able to transfer to wc and maneuver wc in facility, assist with transfers and ADLs prior to his original COVID infection. Patient reports not being out of bed since returning to SNF. On evaluation - which was limited to bed eval due to active rectal bleeding - patient mod assist for rolling left and right, +2 for safety and total care for pericare care.Patient will benefit from skilled PT services while in hospital to improve deficits andimprove functional abilities in order to reduce caregiver burden. Recommend return to LTC at discharge.    Follow Up Recommendations SNF    Equipment Recommendations  None recommended by PT    Recommendations for Other Services       Precautions / Restrictions Precautions Precautions: Fall Precaution Comments: seizure, craniotomy (needs helmet) Restrictions Weight Bearing Restrictions: No      Mobility  Bed Mobility Overal bed mobility: Needs Assistance Bed Mobility: Rolling Rolling: Mod assist;+2 for safety/equipment         General bed mobility comments: Able to roll left and right for  pericare and bed pad change. Patient able to assist with use of bed rails. Mobility limited by rectal bleeding during evaluation.    Transfers                 General transfer comment: TBA  Ambulation/Gait                Stairs            Wheelchair Mobility    Modified Rankin (Stroke Patients Only)       Balance                                             Pertinent Vitals/Pain Pain Assessment: Faces Faces Pain Scale: Hurts whole lot Pain Location: RLE Pain Descriptors / Indicators: Discomfort;Grimacing;Moaning Pain Intervention(s): Limited activity within patient's tolerance;Monitored during session    Home Living Family/patient expects to be discharged to:: Skilled nursing facility                      Prior Function Level of Independence: Needs assistance   Gait / Transfers Assistance Needed: It is reported that patient can typically transfer to wc and manuever wc around facility prior to initial COVID infection. However, pt reports that he has not been out of bed since returning to SNF.  ADL's / Homemaking Assistance Needed: needs assistance for all ADLs. Able to feed himself with set up and assistance with grooming.        Hand Dominance   Dominant  Hand: Left    Extremity/Trunk Assessment   Upper Extremity Assessment Upper Extremity Assessment: RUE deficits/detail;Defer to OT evaluation;LUE deficits/detail RUE Deficits / Details: Able to grossly raise shoulder to 90 degrees, elbow strength 4/5, tricep strength 4/5, able to extend wrist to neutral (strength not tested), able to open and close fingers RUE Sensation:  (able to feel pain and discomfort) RUE Coordination: decreased gross motor;decreased fine motor LUE Deficits / Details: Shoulder 3+/5 strength, 4/5 elbow strength, 5/5 wrist strength, 4-/5 grip LUE Sensation: WNL LUE Coordination: WNL    Lower Extremity Assessment Lower Extremity Assessment: RLE  deficits/detail;LLE deficits/detail RLE Deficits / Details: severe edema of the leg, knee contracted at ~ 60*, able to grossly lift leg off bed with assist but with pain noted RLE: Unable to fully assess due to pain LLE Deficits / Details: ROM grossly WFL with 4-/5 strength       Communication   Communication: Expressive difficulties  Cognition Arousal/Alertness: Awake/alert Behavior During Therapy: WFL for tasks assessed/performed Overall Cognitive Status: Difficult to assess                                 General Comments: Able to follow commands. Needs questions repeated quite often. Verbilizations limited predominantly one word answers but can string a larger sentence together at times.      General Comments      Exercises     Assessment/Plan    PT Assessment Patient needs continued PT services  PT Problem List Decreased strength;Decreased balance;Decreased knowledge of precautions       PT Treatment Interventions Functional mobility training;Therapeutic activities;Therapeutic exercise;Patient/family education    PT Goals (Current goals can be found in the Care Plan section)  Acute Rehab PT Goals Patient Stated Goal: none stated PT Goal Formulation: Patient unable to participate in goal setting Time For Goal Achievement: 11/24/20 Potential to Achieve Goals: Fair    Frequency Min 2X/week   Barriers to discharge        Co-evaluation PT/OT/SLP Co-Evaluation/Treatment: Yes Reason for Co-Treatment: For patient/therapist safety PT goals addressed during session: Mobility/safety with mobility OT goals addressed during session: ADL's and self-care       AM-PAC PT "6 Clicks" Mobility  Outcome Measure Help needed turning from your back to your side while in a flat bed without using bedrails?: A Lot Help needed moving from lying on your back to sitting on the side of a flat bed without using bedrails?: A Lot Help needed moving to and from a bed to a  chair (including a wheelchair)?: A Lot Help needed standing up from a chair using your arms (e.g., wheelchair or bedside chair)?: Total Help needed to walk in hospital room?: Total Help needed climbing 3-5 steps with a railing? : Total 6 Click Score: 9    End of Session   Activity Tolerance: Patient tolerated treatment well;Patient limited by pain Patient left: in bed Nurse Communication: Mobility status;Need for lift equipment PT Visit Diagnosis: Difficulty in walking, not elsewhere classified (R26.2);Hemiplegia and hemiparesis;Muscle weakness (generalized) (M62.81)    Time: 6644-0347 PT Time Calculation (min) (ACUTE ONLY): 27 min   Charges:   PT Evaluation $PT Eval Moderate Complexity: 1 Mod          Mauro Kaufmann PT Acute Rehabilitation Services Pager [phone] Office [phone]   Abilene Mcphee 11/10/2020, 12:58 PM

## 2020-11-10 NOTE — Progress Notes (Addendum)
Progress Note   Subjective  Chief Complaint:Hematochezia  Patient tells me that he is almost continuously passing small amounts of brb from his rectum, this is confirmed by nursing staff who reports "it looks like he peed a red tinged pee from his rectum". Patient denies abdominal pain or any new symptoms today.   Objective   Vital signs in last 24 hours: Temp:  [97.6 F (36.4 C)-98.3 F (36.8 C)] 98.3 F (36.8 C) (01/20 0800) Pulse Rate:  [108-112] 111 (01/20 1126) Resp:  [15-27] 22 (01/20 1126) BP: (98-122)/(42-73) 115/60 (01/20 1126) SpO2:  [95 %-100 %] 100 % (01/20 0400) Weight:  [135.5 kg] 135.5 kg (01/20 0500) Last BM Date: 11/10/20 General:    white male in NAD, post surgical altered cranial anatomy Heart:  Regular rate and rhythm; no murmurs +peripheral edema Lungs: Respirations even and unlabored, lungs CTA bilaterally Abdomen:  Soft, Mild generalized ttp and nondistended. Normal bowel sounds. Extremities:  Without edema. Neurologic:  Alert and oriented,  grossly normal neurologically. Psych:  Cooperative. Normal mood and affect.  Intake/Output from previous day: 01/19 0701 - 01/20 0700 In: 2852.5 [P.O.:720; I.V.:1832.5; Blood:300] Out: -   Lab Results: Recent Labs    11/08/20 1319 11/09/20 0045 11/09/20 0614 11/10/20 0839  WBC 9.4  --  10.2 6.3  HGB 7.5* 8.3* 9.3* 8.2*  HCT 23.1* 25.5* 27.8* 25.6*  PLT 164  --  161 169   BMET Recent Labs    11/08/20 0446 11/09/20 0614 11/10/20 0839  NA 147* 153* 149*  K 3.8 2.9* 3.2*  CL 107 115* 114*  CO2 30 30 31   GLUCOSE 124* 115* 95  BUN 41* 21 16  CREATININE 1.49* 1.16 1.18  CALCIUM 7.2* 7.7* 7.9*   LFT Recent Labs    11/10/20 0839  PROT 5.5*  ALBUMIN 2.7*  AST 29  ALT 16  ALKPHOS 57  BILITOT 1.3*   PT/INR Recent Labs    11/07/20 1508  LABPROT 13.1  INR 1.0    Studies/Results: VAS 11/09/20 LOWER EXTREMITY VENOUS (DVT)  Result Date: 11/09/2020  Lower Venous DVT Study Indications: Swelling.   Risk Factors: COVID 19 positive. Limitations: Body habitus, poor ultrasound/tissue interface and patient positioning, patient movement. Comparison Study: No prior studies. Performing Technologist: 11/11/2020 RVT  Examination Guidelines: A complete evaluation includes B-mode imaging, spectral Doppler, color Doppler, and power Doppler as needed of all accessible portions of each vessel. Bilateral testing is considered an integral part of a complete examination. Limited examinations for reoccurring indications may be performed as noted. The reflux portion of the exam is performed with the patient in reverse Trendelenburg.  +---------+---------------+---------+-----------+----------+--------------+ RIGHT    CompressibilityPhasicitySpontaneityPropertiesThrombus Aging +---------+---------------+---------+-----------+----------+--------------+ CFV      Full           Yes      Yes                                 +---------+---------------+---------+-----------+----------+--------------+ SFJ      Full                                                        +---------+---------------+---------+-----------+----------+--------------+ FV Prox  Full                                                        +---------+---------------+---------+-----------+----------+--------------+  FV Mid   Full                                                        +---------+---------------+---------+-----------+----------+--------------+ FV DistalFull                                                        +---------+---------------+---------+-----------+----------+--------------+ PFV      Full                                                        +---------+---------------+---------+-----------+----------+--------------+ POP      Full           Yes      Yes                                 +---------+---------------+---------+-----------+----------+--------------+ PTV      Full                                                         +---------+---------------+---------+-----------+----------+--------------+ PERO     Full                                                        +---------+---------------+---------+-----------+----------+--------------+   +---------+---------------+---------+-----------+----------+-------------------+ LEFT     CompressibilityPhasicitySpontaneityPropertiesThrombus Aging      +---------+---------------+---------+-----------+----------+-------------------+ CFV      Full           Yes      Yes                                      +---------+---------------+---------+-----------+----------+-------------------+ SFJ      Full                                                             +---------+---------------+---------+-----------+----------+-------------------+ FV Prox  Full                                                             +---------+---------------+---------+-----------+----------+-------------------+ FV Mid   Full                                                             +---------+---------------+---------+-----------+----------+-------------------+  FV Distal               Yes      Yes                                      +---------+---------------+---------+-----------+----------+-------------------+ PFV      Full                                                             +---------+---------------+---------+-----------+----------+-------------------+ POP      Full           Yes      Yes                                      +---------+---------------+---------+-----------+----------+-------------------+ PTV      Full                                                             +---------+---------------+---------+-----------+----------+-------------------+ PERO                                                  Not well visualized  +---------+---------------+---------+-----------+----------+-------------------+     Summary: RIGHT: - There is no evidence of deep vein thrombosis in the lower extremity. However, portions of this examination were limited- see technologist comments above.  - No cystic structure found in the popliteal fossa.  LEFT: - There is no evidence of deep vein thrombosis in the lower extremity. However, portions of this examination were limited- see technologist comments above.  - No cystic structure found in the popliteal fossa.  *See table(s) above for measurements and observations. Electronically signed by Sherald Hesshristopher Clark MD on 11/09/2020 at 6:05:46 PM.    Final        Assessment / Plan:   Assessment: 1. Hematochezia: s/p sigmoidoscopy 11/08/20 with multiple sigmoid colon ulcers of uncertain etiology (possibly related to ischemic injury vs COVID) 2. ABLA: secondary to above 3. Chronic anticoagulation: on Plavix, last dose within the last 4 days  Plan: 1. Continue to monitor hgb with transfusion as needed 2. Continue supportive measures 3. Continue full liquids for now and protein supplements 4. Please await any further recommendations from Dr. Leone PayorGessner later today  Thank you for your kind consultation, we will continue to follow.   LOS: 3 days   Unk LightningJennifer Lynne Lemmon  11/10/2020, 11:54 AM  Path is back -  FINAL MICROSCOPIC DIAGNOSIS:   A. COLON, SIGMOID, BIOPSY:  - Severe acute non-specific colitis, see comment.   COMMENT:   The findings are non-specific and the differential includes mainly  ischemic change and drug effect. Infection can not be excluded but is  felt less likely. Dr. Kenard GowerKashikar has reviewed the case.    So probably an ischemic colitis which should heal on its own.  Would  continue supportive care - we will follow. May be some time before bleeding stops but hopefully the magnitude is decreasing as clopidogrel wears off and new PLT's help.  Iva Boop, MD, [facility]  Gastroenterology 11/10/2020 5:47 PM

## 2020-11-10 NOTE — Progress Notes (Signed)
Initial Nutrition Assessment  RD working remotely.  DOCUMENTATION CODES:   Obesity unspecified  INTERVENTION:  - will decrease Boost Breeze from TID to once/day, each supplement provides 250 kcal and 9 grams of protein. - will order Ensure Enlive BID, each supplement provides 350 kcal and 20 grams of protein - will order 30 ml Prosource Plus once/day, each supplement provides 100 kcal and 15 grams protein.  - complete NFPE when feasible.   NUTRITION DIAGNOSIS:   Increased nutrient needs related to acute illness,catabolic illness as evidenced by estimated needs.  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 He was admitted earlier this month with COVID-19 and hypoxia. He was found positive on 1/3 and was discharged on 1/11. He returned to the ED from SNF d/t rectal bleeding.  Patient noted to be a/o to self and time. Did not attempt to call patient in light of this.   Diet advanced from NPO to CLD on 1/18 at 1450 and to FLD today at 1005. He consumed 50% of lunch today. Will order and adjust oral nutrition supplements as outlined above.   Weight today is 299 lb and weight on 08/15/20 was 291 lb. Deep pitting edema to RLE and mild pitting edema to LLE documented in the edema section of flow sheet.   Per notes: - BRBPR with acute blood loss anemia and hemorrhagic shock - s/p flex sig 1/18 - dx with COVID-19 on 1/3 to remain on isolation precautions until 1/21 - hx of TBI and seizure disorder - RLE swelling without evidence of DVT   Labs reviewed; Na: 149 mmol/l, K: 3.2 mmol/l, Cl: 114 mmol/l, Ca: 7.9 mg/dl. Medications reviewed; 112  mcg oral synthroid/day, 40 mg oral protonix/day, 40 mEq Klor-Con x2 dose 1/20.  IVF; D5 @ 75 ml/hr (306 kcal).     NUTRITION - FOCUSED PHYSICAL EXAM:  unable to complete at this time.   Diet Order:   Diet Order            Diet full liquid Room service appropriate? Yes; Fluid consistency: Thin  Diet effective now                 EDUCATION NEEDS:   Not appropriate for education at this time  Skin:  Skin Assessment: Skin Integrity Issues: Skin Integrity Issues:: Stage II Stage II: coccyx  Last BM:  1/20 (type 7 x4)  Height:   Ht Readings from Last 1 Encounters:  11/08/20 6\' 4"  (1.93 m)    Weight:   Wt Readings from Last 1 Encounters:  11/10/20 135.5 kg    Ideal Body Weight:     BMI:  Body mass index is 36.36 kg/m.  Estimated Nutritional Needs:   Kcal:  2350-2550 kcal  Protein:  145-160 grams  Fluid:  >/= 2.5 L/day      11/12/20, MS, RD, LDN, CNSC Inpatient Clinical Dietitian RD pager # available in AMION  After hours/weekend pager # available in [facility]

## 2020-11-10 NOTE — Progress Notes (Signed)
PROGRESS NOTE  Austin Hartman YQI:[id number] DOB: [birth year] DOA: 11/07/2020 PCP: Galvin Proffer, MD   LOS: 3 days   Brief Narrative / Interim history: 70 year old male with history of TBI, right hemiplegia, chronic dysphagia, CAD, A. fib/flutter not on anticoagulation due to SDH history, chronic diastolic CHF, chronic kidney disease stage II, epilepsy, admitted earlier this month with COVID-19 and hypoxia came back to the ED from his SNF due to rectal bleeding.  He tested positive for COVID-19 on 1/3, discharged on 1/11.  He was recommended 21 days of isolation up until 1/24  Subjective / 24h Interval events: Doing well this morning, no significant complaints.  Assessment & Plan: Principal Problem Bright red blood per rectum, acute blood loss anemia, hemorrhagic shock -Patient was admitted to the hospital with sudden onset of bright red blood per rectum, unclear whether had some associated pain due to being a poor historian.  Underwent a CT angiogram x2 due to ongoing bleeding without a cause.  GI consulted and patient underwent a flexible sigmoidoscopy on 1/18 which showed congested, eroded, plaque covered and ulcerated mucosa in the sigmoid colon with many punched out ulcers, unclear etiology whether this is atypical ischemia versus viral induced given recent Covid.  No lesions amenable to interventions.  He was also quite hypotensive requiring peripheral vasopressors initially  -Received a total of 5 units of packed red blood cells and platelets due to platelet dysfunction in the setting of recent Plavix -Seems to have subsided, no further bloody stools since yesterday morning.  Blood pressures and he is off pressors  Active Problems Recent COVID-19 infection -Currently no respiratory symptoms, continue isolation up until 1/24.  On room air  TBI, seizure disorder -TBI at age 75, chronic right-sided weakness and speech difficulties. Continue Keppra, Lamictal and rest of home medications.  He  is wearing a helmet  Hypokalemia  -Due to GI losses, continue to replete today  A. Fib/flutter -Not anticoagulated due to history of SDH and now GI bleed. Continue amiodarone, digoxin  Right leg pain, swelling -Lower extremity Doppler did not show any evidence of DVT thankfully.  Pain is a bit better  CAD -Hold aspirin and Plavix in the setting of bleed  Chronic diastolic CHF -Currently appears euvolemic  Chronic kidney disease stage II -Baseline about 1.2, currently at baseline  Hypernatremia -Has not been eating or drinking, but did receive fluids.  Change to the 5 water   Scheduled Meds: . sodium chloride   Intravenous Once  . sodium chloride   Intravenous Once  . sodium chloride   Intravenous Once  . amiodarone  200 mg Oral Daily  . atorvastatin  20 mg Oral Daily  . Chlorhexidine Gluconate Cloth  6 each Topical Q0600  . cloBAZam  40 mg Oral Daily  . digoxin  125 mcg Oral Daily  . feeding supplement  1 Container Oral TID BM  . Ipratropium-Albuterol  2 puff Inhalation BID  . lamoTRIgine  400 mg Oral BID  . levETIRAcetam  1,250 mg Oral Daily   And  . levETIRAcetam  1,000 mg Oral QHS  . levothyroxine  112 mcg Oral Q0600  . mouth rinse  15 mL Mouth Rinse BID  . mupirocin ointment  1 application Nasal BID  . pantoprazole (PROTONIX) IV  40 mg Intravenous Q24H   Continuous Infusions: . sodium chloride Stopped (11/09/20 1703)  . dextrose Stopped (11/10/20 0646)  . norepinephrine (LEVOPHED) Adult infusion Stopped (11/09/20 1221)   PRN Meds:.ondansetron **OR** ondansetron (ZOFRAN) IV  Diet Orders (From admission, onward)    Start     Ordered   11/09/20 1004  Diet full liquid Room service appropriate? Yes; Fluid consistency: Thin  Diet effective now       Question Answer Comment  Room service appropriate? Yes   Fluid consistency: Thin      11/09/20 1003          DVT prophylaxis: SCDs Start: 11/07/20 2120     Code Status: DNR  Family Communication: d/w  patient  Status is: Inpatient  Remains inpatient appropriate because:Inpatient level of care appropriate due to severity of illness  Dispo: The patient is from: SNF              Anticipated d/c is to: SNF              Anticipated d/c date is: 3 days              Patient currently is not medically stable to d/c.  Consultants:  GI  Procedures:  None  Microbiology  None   Antimicrobials: None     Objective: Vitals:   11/10/20 0200 11/10/20 0400 11/10/20 0500 11/10/20 0800  BP: (!) 98/56 (!) 104/42    Pulse: (!) 109 (!) 109    Resp: 16 17    Temp:    98.3 F (36.8 C)  TempSrc:    Oral  SpO2: 99% 100%    Weight:   135.5 kg   Height:        Intake/Output Summary (Last 24 hours) at 11/10/2020 0948 Last data filed at 11/10/2020 [id number] Gross per 24 hour  Intake 2852.5 ml  Output --  Net 2852.5 ml   Filed Weights   11/08/20 1410 11/10/20 0500  Weight: (!) 147.1 kg 135.5 kg    Examination:  Constitutional: No distress, in bed Eyes: No icterus ENMT: mmm Neck: normal, supple Respiratory: Clear bilaterally, no wheezing or crackles Cardiovascular: Regular rate and rhythm, no murmurs, trace edema Abdomen: Soft, nontender, nondistended, bowel sounds positive Musculoskeletal: no clubbing / cyanosis.  Skin: No rashes seen Neurologic: no new focal deficits  Data Reviewed: I have independently reviewed following labs and imaging studies   CBC: Recent Labs  Lab 11/07/20 1508 11/07/20 2124 11/08/20 0446 11/08/20 1319 11/09/20 0045 11/09/20 0614 11/10/20 0839  WBC 8.3  --   --  9.4  --  10.2 6.3  NEUTROABS 7.0  --   --   --   --   --   --   HGB 11.6*   < > 11.2* 7.5* 8.3* 9.3* 8.2*  HCT 37.1*   < > 35.1* 23.1* 25.5* 27.8* 25.6*  MCV 93.9  --   --  92.8  --  89.7 94.1  PLT 266  --   --  164  --  161 169   < > = values in this interval not displayed.   Basic Metabolic Panel: Recent Labs  Lab 11/07/20 1508 11/08/20 0446 11/09/20 0614 11/10/20 0839  NA 143  147* 153* 149*  K 4.5 3.8 2.9* 3.2*  CL 95* 107 115* 114*  CO2 37* 30 30 31   GLUCOSE 142* 124* 115* 95  BUN 34* 41* 21 16  CREATININE 1.41* 1.49* 1.16 1.18  CALCIUM 8.4* 7.2* 7.7* 7.9*   Liver Function Tests: Recent Labs  Lab 11/07/20 1508 11/08/20 0446 11/09/20 0614 11/10/20 0839  AST 30 21 19 29   ALT 21 15 13 16   ALKPHOS 86 60 58 57  BILITOT  1.5* 2.0* 1.7* 1.3*  PROT 6.4* 4.9* 5.4* 5.5*  ALBUMIN 2.6* 2.1* 3.0* 2.7*   Coagulation Profile: Recent Labs  Lab 11/07/20 1508  INR 1.0   HbA1C: No results for input(s): HGBA1C in the last 72 hours. CBG: No results for input(s): GLUCAP in the last 168 hours.  Recent Results (from the past 240 hour(s))  MRSA PCR Screening     Status: Abnormal   Collection Time: 11/09/20 11:18 PM   Specimen: Nasal Mucosa; Nasopharyngeal  Result Value Ref Range Status   MRSA by PCR POSITIVE (A) NEGATIVE Final    Comment:        The GeneXpert MRSA Assay (FDA approved for NASAL specimens only), is one component of a comprehensive MRSA colonization surveillance program. It is not intended to diagnose MRSA infection nor to guide or monitor treatment for MRSA infections. RESULT CALLED TO, READ BACK BY AND VERIFIED WITH: WALTON C. 01.20.22 @ 0121 BY MECIAL J. Performed at [facility], [address] [place], [place] [address]      Radiology Studies: VAS US LOWER EXTREMITY VENOUS (DVT)  Result Date: 11/09/2020  Lower Venous DVT Study Indications: Swelling.  Risk Factors: COVID 19 positive. Limitations: Body habitus, poor ultrasound/tissue interface and patient positioning, patient movement. Comparison Study: No prior studies. Performing Technologist: Chanda BusingGregory Collins RVT  Examination Guidelines: A complete evaluation includes B-mode imaging, spectral Doppler, color Doppler, and power Doppler as needed of all accessible portions of each vessel. Bilateral testing is considered an integral part of a complete examination.  Limited examinations for reoccurring indications may be performed as noted. The reflux portion of the exam is performed with the patient in reverse Trendelenburg.  +---------+---------------+---------+-----------+----------+--------------+ RIGHT    CompressibilityPhasicitySpontaneityPropertiesThrombus Aging +---------+---------------+---------+-----------+----------+--------------+ CFV      Full           Yes      Yes                                 +---------+---------------+---------+-----------+----------+--------------+ SFJ      Full                                                        +---------+---------------+---------+-----------+----------+--------------+ FV Prox  Full                                                        +---------+---------------+---------+-----------+----------+--------------+ FV Mid   Full                                                        +---------+---------------+---------+-----------+----------+--------------+ FV DistalFull                                                        +---------+---------------+---------+-----------+----------+--------------+ PFV  Full                                                        +---------+---------------+---------+-----------+----------+--------------+ POP      Full           Yes      Yes                                 +---------+---------------+---------+-----------+----------+--------------+ PTV      Full                                                        +---------+---------------+---------+-----------+----------+--------------+ PERO     Full                                                        +---------+---------------+---------+-----------+----------+--------------+   +---------+---------------+---------+-----------+----------+-------------------+ LEFT     CompressibilityPhasicitySpontaneityPropertiesThrombus Aging       +---------+---------------+---------+-----------+----------+-------------------+ CFV      Full           Yes      Yes                                      +---------+---------------+---------+-----------+----------+-------------------+ SFJ      Full                                                             +---------+---------------+---------+-----------+----------+-------------------+ FV Prox  Full                                                             +---------+---------------+---------+-----------+----------+-------------------+ FV Mid   Full                                                             +---------+---------------+---------+-----------+----------+-------------------+ FV Distal               Yes      Yes                                      +---------+---------------+---------+-----------+----------+-------------------+ PFV      Full                                                             +---------+---------------+---------+-----------+----------+-------------------+  POP      Full           Yes      Yes                                      +---------+---------------+---------+-----------+----------+-------------------+ PTV      Full                                                             +---------+---------------+---------+-----------+----------+-------------------+ PERO                                                  Not well visualized +---------+---------------+---------+-----------+----------+-------------------+     Summary: RIGHT: - There is no evidence of deep vein thrombosis in the lower extremity. However, portions of this examination were limited- see technologist comments above.  - No cystic structure found in the popliteal fossa.  LEFT: - There is no evidence of deep vein thrombosis in the lower extremity. However, portions of this examination were limited- see technologist comments above.  - No cystic  structure found in the popliteal fossa.  *See table(s) above for measurements and observations. Electronically signed by Sherald Hess MD on 11/09/2020 at 6:05:46 PM.    Final     Pamella Pert, MD, PhD [facility]  Between 7 am - 7 pm I am available, please contact me via Amion or Securechat  Between 7 pm - 7 am I am not available, please contact night coverage MD/APP via Amion

## 2020-11-11 ENCOUNTER — Inpatient Hospital Stay (HOSPITAL_COMMUNITY): Payer: Medicare Other

## 2020-11-11 DIAGNOSIS — K559 Vascular disorder of intestine, unspecified: Secondary | ICD-10-CM | POA: Diagnosis not present

## 2020-11-11 LAB — CBC
HCT: 25.4 % — ABNORMAL LOW (ref 39.0–52.0)
HCT: 26.9 % — ABNORMAL LOW (ref 39.0–52.0)
Hemoglobin: 8.2 g/dL — ABNORMAL LOW (ref 13.0–17.0)
Hemoglobin: 8.5 g/dL — ABNORMAL LOW (ref 13.0–17.0)
MCH: 30.6 pg (ref 26.0–34.0)
MCH: 30.5 pg (ref 26.0–34.0)
MCHC: 32.3 g/dL (ref 30.0–36.0)
MCHC: 31.6 g/dL (ref 30.0–36.0)
MCV: 94.8 fL (ref 80.0–100.0)
MCV: 96.4 fL (ref 80.0–100.0)
Platelets: 142 10*3/uL — ABNORMAL LOW (ref 150–400)
Platelets: 156 10*3/uL (ref 150–400)
RBC: 2.68 MIL/uL — ABNORMAL LOW (ref 4.22–5.81)
RBC: 2.79 MIL/uL — ABNORMAL LOW (ref 4.22–5.81)
RDW: 19.2 % — ABNORMAL HIGH (ref 11.5–15.5)
RDW: 19.6 % — ABNORMAL HIGH (ref 11.5–15.5)
WBC: 6.1 10*3/uL (ref 4.0–10.5)
WBC: 6.4 10*3/uL (ref 4.0–10.5)
nRBC: 0.8 % — ABNORMAL HIGH (ref 0.0–0.2)
nRBC: 0.5 % — ABNORMAL HIGH (ref 0.0–0.2)

## 2020-11-11 LAB — BASIC METABOLIC PANEL
Anion gap: 8 (ref 5–15)
BUN: 17 mg/dL (ref 8–23)
CO2: 25 mmol/L (ref 22–32)
Calcium: 8 mg/dL — ABNORMAL LOW (ref 8.9–10.3)
Chloride: 112 mmol/L — ABNORMAL HIGH (ref 98–111)
Creatinine, Ser: 1.14 mg/dL (ref 0.61–1.24)
GFR, Estimated: >60 mL/min (ref >60)
Glucose, Bld: 106 mg/dL — ABNORMAL HIGH (ref 70–99)
Potassium: 3.6 mmol/L (ref 3.5–5.1)
Sodium: 145 mmol/L (ref 135–145)

## 2020-11-11 NOTE — Progress Notes (Addendum)
Progress Note   Subjective  Chief Complaint: Hematochezia  This morning patient appears slightly more impaired than yesterday, denies abdominal pain.  Nursing staff reports some continued small amounts of bright red blood with stool overnight.   Objective   Vital signs in last 24 hours: Temp:  [97.8 F (36.6 C)-100 F (37.8 C)] 97.8 F (36.6 C) (01/21 0840) Pulse Rate:  [106-113] 113 (01/21 0800) Resp:  [14-22] 15 (01/21 0800) BP: (79-133)/(53-93) 119/73 (01/21 0800) SpO2:  [97 %-100 %] 98 % (01/21 0800) Weight:  [136.5 kg] 136.5 kg (01/21 0500) Last BM Date: 11/10/20 General:    white male in NAD, postsurgical altered cranial anatomy Heart:  Regular rate and rhythm; no murmurs + peripheral edema Lungs: Respirations even and unlabored, lungs CTA bilaterally Abdomen:  Soft, nontender and nondistended. Normal bowel sounds. Extremities:  +b/l peripheral edema Psych:  Cooperative. Patient seemed slightly less alert this morning and it was difficult for him to speak with me  Intake/Output from previous day: 01/20 0701 - 01/21 0700 In: 1340.5 [P.O.:120; I.V.:1220.5] Out: 400 [Urine:400]   Lab Results: Recent Labs    11/10/20 0839 11/10/20 2138 11/11/20 0635  WBC 6.3 7.6 6.1  HGB 8.2* 8.7* 8.2*  HCT 25.6* 26.7* 25.4*  PLT 169 152 142*   BMET Recent Labs    11/09/20 0614 11/10/20 0839 11/11/20 0635  NA 153* 149* 145  K 2.9* 3.2* 3.6  CL 115* 114* 112*  CO2 30 31 25   GLUCOSE 115* 95 106*  BUN 21 16 17   CREATININE 1.16 1.18 1.14  CALCIUM 7.7* 7.9* 8.0*   LFT Recent Labs    11/10/20 0839  PROT 5.5*  ALBUMIN 2.7*  AST 29  ALT 16  ALKPHOS 57  BILITOT 1.3*     Assessment / Plan:    Assessment: 1.  Hematochezia: Status post sigmoidoscopy 11/08/2020 with multiple sigmoid colon ulcers, pathology showing severe acute nonspecific colitis which is possibly ischemic versus drug effect, hemoglobin is remaining mostly stable 8.7--> 8.2 overnight, bleeding  continues but in a smaller amount 2.  Acute blood loss anemia: Secondary to above 3.  Chronic anticoagulation: On Plavix, last dose was in the last 5 days, has received platelet transfusion as well  Plan: 1.  Continue to monitor hemoglobin and transfusion as needed less than 7 2.  Findings were most suspicious for ischemic colitis which should heal on its own, would suspect bleeding would stop eventually as Clopidogrel wears off and new platelets help 3.  Spoke with Dr. 11/12/20 this morning in regards to patient's altered mental state and inability to use a straw, he is going to proceed with a CT of the head given that patient is off of his blood thinners 4.  Please await any further recommendations from Dr. 11/10/2020 later today  Thank you for your kind consultation.   LOS: 4 days   Elvera Lennox  11/11/2020, 10:12 AM  I have seen and evaluated the patient as well.  I agree with the above note assessment and plan.  Here are my additional thoughts:  I am a bit puzzled by the cause of his colitis and diarrhea though the best available diagnosis is ischemia I think.  Presumably from hypotension when he has been ill over the past few weeks.  I am not aware of any association with treatment for COVID and COVID with ischemic colitis though certainly people get diarrhea and he had that and was C. difficile negative on more than one  occasion.  I do not think any of his medications are causing this.  He is not worse but is not necessarily improved significantly either.  Plavix certainly was making the bleeding worse.  His CT scan of the head was negative.  I will see him again tomorrow.  We will continue supportive care.  Iva Boop, MD, [facility]  Gastroenterology 11/11/2020 4:05 PM

## 2020-11-11 NOTE — Progress Notes (Signed)
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  He tested positive for COVID-19 on 1/3, discharged on 1/11.  He was recommended 21 days of isolation up until 1/24  Subjective / 24h Interval events: Slightly harder to understand this morning.  Asked whether he had more bleeding and told me twice  Assessment & Plan: Principal Problem Bright red blood per rectum, acute blood loss anemia, hemorrhagic shock -Patient was admitted to the hospital with sudden onset of bright red blood per rectum, unclear whether had some associated pain due to being a poor historian.  Underwent a CT angiogram x2 due to ongoing bleeding without a cause.  GI consulted and patient underwent a flexible sigmoidoscopy on 1/18 which showed congested, eroded, plaque covered and ulcerated mucosa in the sigmoid colon with many punched out ulcers, unclear etiology whether this is atypical ischemia versus viral induced given recent Covid.  No lesions amenable to interventions.  He was also quite hypotensive requiring peripheral vasopressors  -Received a total of 5 units of packed red blood cells and platelets due to platelet dysfunction in the setting of recent Plavix -Continues to have low-grade wheezing but hemoglobin overall is stable.  She is no longer requiring vasopressors.  Active Problems Recent COVID-19 infection -Currently no respiratory symptoms, continue isolation up until 1/24.  On room air  TBI, seizure disorder, speech impairment-TBI at age 79, chronic right-sided weakness and speech difficulties.  Continue Keppra, Lamictal and rest of home medications.  He is wearing a helmet -Possibly subtle change in his mentation today and weakness, will obtain a CT scan  Hypokalemia  -Due to GI losses, continue to replete today  A. Fib/flutter -Not anticoagulated due to history of SDH and now GI bleed. Continue amiodarone, digoxin  Right leg pain, swelling -Lower extremity Doppler did not show any evidence of DVT thankfully.  Not complaining of pain today  CAD -Hold aspirin and Plavix in the setting of bleed  Chronic diastolic CHF -Currently appears euvolemic  Chronic kidney disease stage II -Baseline about 1.2, currently at baseline  Hypernatremia -Sodium has normalized   Scheduled Meds:  (feeding supplement) PROSource Plus  30 mL Oral Daily   sodium chloride   Intravenous Once   sodium chloride   Intravenous Once   sodium chloride   Intravenous Once   amiodarone  200 mg Oral Daily   atorvastatin  20 mg Oral Daily   Chlorhexidine Gluconate Cloth  6 each Topical Q0600   cloBAZam  40 mg Oral Daily   digoxin  125 mcg Oral Daily   feeding supplement  1 Container Oral Q24H   feeding supplement  237 mL Oral BID BM   Ipratropium-Albuterol  2 puff Inhalation BID   lamoTRIgine  400 mg Oral BID   levETIRAcetam  1,250 mg Oral Daily   And   levETIRAcetam  1,000 mg Oral QHS   levothyroxine  112 mcg Oral Q0600   mouth rinse  15 mL Mouth Rinse BID   mupirocin ointment  1 application Nasal BID   pantoprazole (PROTONIX) IV  40 mg Intravenous Q24H   Continuous Infusions:  sodium chloride Stopped (11/09/20 1703)   sodium chloride Stopped (11/10/20 2108)   norepinephrine (LEVOPHED) Adult infusion Stopped (11/10/20 2058)   PRN Meds:.ondansetron **OR** ondansetron (ZOFRAN) IV, traMADol  Diet Orders (From admission, onward)    Start     Ordered   11/09/20 1004  Diet full liquid Room service appropriate? Yes; Fluid consistency: Thin  Diet effective now       Question  Answer Comment  Room service appropriate? Yes   Fluid consistency: Thin      11/09/20 1003          DVT prophylaxis: SCDs Start: 11/07/20 2120     Code Status: DNR  Family Communication: d/w patient, updated sister over the phone  Status is: Inpatient  Remains inpatient appropriate because:Inpatient level of care appropriate due to severity of illness  Dispo: The patient is from: SNF              Anticipated d/c is to: SNF              Anticipated d/c date is: 3 days              Patient currently is not medically stable to d/c.  Consultants:  GI  Procedures:  None  Microbiology  None   Antimicrobials: None     Objective: Vitals:   11/11/20 0600 11/11/20 0800 11/11/20 0840 11/11/20 1030  BP: 119/71 119/73    Pulse: (!) 106 (!) 113  (!) 112  Resp: 19 15    Temp:   97.8 F (36.6 C)   TempSrc:   Oral   SpO2: 97% 98%    Weight:      Height:        Intake/Output Summary (Last 24 hours) at 11/11/2020 1101 Last data filed at 11/11/2020 0600 Gross per 24 hour  Intake 1340.52 ml  Output 400 ml  Net 940.52 ml   Filed Weights   11/08/20 1410 11/10/20 0500 11/11/20 0500  Weight: (!) 147.1 kg 135.5 kg (!) 136.5 kg    Examination:  Constitutional: NAD, in bed Eyes: No icterus ENMT: mmm Neck: normal, supple Respiratory: Clear bilaterally, no wheezing, no crackles Cardiovascular: Regular rate and rhythm, no murmurs, 1+ edema Abdomen: Soft, nontender, nondistended, bowel sounds positive Musculoskeletal: no clubbing / cyanosis.  Skin: No rashes seen Neurologic: No new deficits  Data Reviewed: I have independently reviewed following labs and imaging studies   CBC: Recent Labs  Lab 11/07/20 1508 11/07/20 2124 11/08/20 1319 11/09/20 0045 11/09/20 0614 11/10/20 0839 11/10/20 2138 11/11/20 0635  WBC 8.3  --  9.4  --  10.2 6.3 7.6 6.1  NEUTROABS 7.0  --   --   --   --   --   --   --   HGB 11.6*   < > 7.5* 8.3* 9.3* 8.2* 8.7* 8.2*  HCT 37.1*   < >  23.1* 25.5* 27.8* 25.6* 26.7* 25.4*  MCV 93.9  --  92.8  --  89.7 94.1 91.8 94.8  PLT 266  --  164  --  161 169 152 142*   < > = values in this interval not displayed.   Basic Metabolic Panel: Recent Labs  Lab 11/07/20 1508 11/08/20 0446 11/09/20 0614 11/10/20 0839 11/11/20 0635  NA 143 147* 153* 149* 145  K 4.5 3.8 2.9* 3.2* 3.6  CL 95* 107 115* 114* 112*  CO2 37* 30 30 31 25   GLUCOSE 142* 124* 115* 95 106*  BUN 34* 41* 21 16  17  CREATININE 1.41* 1.49* 1.16 1.18 1.14  CALCIUM 8.4* 7.2* 7.7* 7.9* 8.0*   Liver Function Tests: Recent Labs  Lab 11/07/20 1508 11/08/20 0446 11/09/20 0614 11/10/20 0839  AST 30 21 19 29   ALT 21 15 13 16   ALKPHOS 86 60 58 57  BILITOT 1.5* 2.0* 1.7* 1.3*  PROT 6.4* 4.9* 5.4* 5.5*  ALBUMIN 2.6* 2.1* 3.0* 2.7*   Coagulation Profile: Recent Labs  Lab 11/07/20 1508  INR 1.0   HbA1C: No results for input(s): HGBA1C in the last 72 hours. CBG: No results for input(s): GLUCAP in the last 168 hours.  Recent Results (from the past 240 hour(s))  MRSA PCR Screening     Status: Abnormal   Collection Time: 11/09/20 11:18 PM   Specimen: Nasal Mucosa; Nasopharyngeal  Result Value Ref Range Status   MRSA by PCR POSITIVE (A) NEGATIVE Final    Comment:        The GeneXpert MRSA Assay (FDA approved for NASAL specimens only), is one component of a comprehensive MRSA colonization surveillance program. It is not intended to diagnose MRSA infection nor to guide or monitor treatment for MRSA infections. RESULT CALLED TO, READ BACK BY AND VERIFIED WITH: WALTON C. 01.20.22 @ 0121 BY MECIAL J. Performed at [facility], [address], [place], [place]      Radiology Studies: No results found.  [place], MD, PhD [facility]  Between 7 am - 7 pm I am available, please contact me via Amion or Securechat  Between 7 pm - 7 am I am not available, please contact night coverage MD/APP via Amion

## 2020-11-11 NOTE — Plan of Care (Signed)

## 2020-11-12 DIAGNOSIS — K559 Vascular disorder of intestine, unspecified: Secondary | ICD-10-CM | POA: Diagnosis not present

## 2020-11-12 LAB — BASIC METABOLIC PANEL
Anion gap: 9 (ref 5–15)
BUN: 20 mg/dL (ref 8–23)
CO2: 26 mmol/L (ref 22–32)
Calcium: 8.3 mg/dL — ABNORMAL LOW (ref 8.9–10.3)
Chloride: 111 mmol/L (ref 98–111)
Creatinine, Ser: 1.23 mg/dL (ref 0.61–1.24)
GFR, Estimated: >60 mL/min (ref >60)
Glucose, Bld: 93 mg/dL (ref 70–99)
Potassium: 3.9 mmol/L (ref 3.5–5.1)
Sodium: 146 mmol/L — ABNORMAL HIGH (ref 135–145)

## 2020-11-12 LAB — CBC
HCT: 28.2 % — ABNORMAL LOW (ref 39.0–52.0)
HCT: 26.1 % — ABNORMAL LOW (ref 39.0–52.0)
Hemoglobin: 8.8 g/dL — ABNORMAL LOW (ref 13.0–17.0)
Hemoglobin: 8 g/dL — ABNORMAL LOW (ref 13.0–17.0)
MCH: 29.7 pg (ref 26.0–34.0)
MCH: 30.1 pg (ref 26.0–34.0)
MCHC: 31.2 g/dL (ref 30.0–36.0)
MCHC: 30.7 g/dL (ref 30.0–36.0)
MCV: 95.3 fL (ref 80.0–100.0)
MCV: 98.1 fL (ref 80.0–100.0)
Platelets: 137 10*3/uL — ABNORMAL LOW (ref 150–400)
Platelets: 147 10*3/uL — ABNORMAL LOW (ref 150–400)
RBC: 2.96 MIL/uL — ABNORMAL LOW (ref 4.22–5.81)
RBC: 2.66 MIL/uL — ABNORMAL LOW (ref 4.22–5.81)
RDW: 19.8 % — ABNORMAL HIGH (ref 11.5–15.5)
RDW: 19.6 % — ABNORMAL HIGH (ref 11.5–15.5)
WBC: 5.5 10*3/uL (ref 4.0–10.5)
WBC: 3.8 10*3/uL — ABNORMAL LOW (ref 4.0–10.5)
nRBC: 0.4 % — ABNORMAL HIGH (ref 0.0–0.2)
nRBC: 0 % (ref 0.0–0.2)

## 2020-11-12 NOTE — Progress Notes (Signed)
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  He tested positive for COVID-19 on 1/3, discharged on 1/11.  He was recommended 21 days of isolation up until 1/24  Subjective / 24h Interval events: Says that he is hungry. No other complaints.  Assessment & Plan: Principal Problem Bright red blood per rectum, acute blood loss anemia, hemorrhagic shock -Patient was admitted to the hospital with sudden onset of bright red blood per rectum, unclear whether had some associated pain due to being a poor historian.  Underwent a CT angiogram x2 due to ongoing bleeding without a cause.  GI consulted and patient underwent a flexible sigmoidoscopy on 1/18 which showed congested, eroded, plaque covered and ulcerated mucosa in the sigmoid colon with many punched out ulcers, unclear etiology whether this is atypical ischemia versus viral induced given recent Covid.  No lesions amenable to interventions.  He was also quite hypotensive requiring peripheral vasopressors  -Received a total of 5 units of packed red blood cells and platelets due to platelet dysfunction in the setting of recent Plavix -His bleeding seems to have subsided, brown stool overnight per RN  Active Problems Recent COVID-19 infection -Currently no respiratory symptoms, continue isolation up until 1/24. Remains on room air  TBI, seizure disorder, speech impairment-TBI at age 47, chronic right-sided weakness and speech difficulties. Continue Keppra, Lamictal and rest of home medications.  He is wearing a helmet -CT scan done 1/21  without acute findings  Hypokalemia  -Due to GI losses, potassium normal this morning  A. Fib/flutter -Not anticoagulated due to history of SDH and now GI bleed. Continue amiodarone, digoxin  Right leg pain, swelling -Lower extremity Doppler did not show any evidence of DVT thankfully.  Not complaining of pain today  CAD -Hold aspirin and Plavix in the setting of bleed. Per GI hold for 2 weeks and then resume  Chronic diastolic CHF -Currently appears euvolemic  Chronic kidney disease stage II -Baseline about 1.2, currently at baseline  Hypernatremia -Mild, encourage p.o. intake   Scheduled Meds: . (feeding supplement) PROSource Plus  30 mL Oral Daily  . sodium chloride   Intravenous Once  . sodium chloride   Intravenous Once  . sodium chloride   Intravenous Once  . amiodarone  200 mg Oral Daily  . atorvastatin  20 mg Oral Daily  . Chlorhexidine Gluconate Cloth  6 each Topical Q0600  . cloBAZam  40 mg Oral Daily  . digoxin  125 mcg Oral Daily  . feeding supplement  1 Container Oral Q24H  . feeding supplement  237 mL Oral BID BM  . Ipratropium-Albuterol  2 puff Inhalation BID  . lamoTRIgine  400 mg Oral BID  . levETIRAcetam  1,250 mg Oral Daily   And  . levETIRAcetam  1,000 mg Oral QHS  . levothyroxine  112 mcg Oral Q0600  . mouth rinse  15 mL Mouth Rinse BID  . mupirocin ointment  1 application Nasal BID  . pantoprazole (PROTONIX) IV  40 mg Intravenous Q24H   Continuous Infusions: . sodium chloride Stopped (11/09/20 1703)  . sodium chloride Stopped (11/10/20 2108)  .  norepinephrine (LEVOPHED) Adult infusion Stopped (11/10/20 2058)   PRN Meds:.ondansetron **OR** ondansetron (ZOFRAN) IV, traMADol  Diet Orders (From admission, onward)    Start     Ordered   11/09/20 1004  Diet full liquid Room service appropriate? Yes; Fluid consistency: Thin  Diet effective now       Question Answer Comment  Room service appropriate? Yes   Fluid consistency: Thin      11/09/20  1003          DVT prophylaxis: SCDs Start: 11/07/20 2120     Code Status: DNR  Family Communication: d/w patient, updated sister over the phone  Status is: Inpatient  Remains inpatient appropriate because:Inpatient level of care appropriate due to severity of illness  Dispo: The patient is from: SNF              Anticipated d/c is to: SNF              Anticipated d/c date is: 3 days              Patient currently is not medically stable to d/c.  Consultants:  GI  Procedures:  Flex sig 1/19  Microbiology  None   Antimicrobials: None     Objective: Vitals:   11/12/20 0000 11/12/20 0255 11/12/20 0400 11/12/20 0800  BP: 94/60  96/60   Pulse: (!) 104  (!) 104   Resp: 16  16   Temp: 98.1 F (36.7 C)   99.2 F (37.3 C)  TempSrc: Oral   Axillary  SpO2: 98%  96%   Weight:  136 kg    Height:        Intake/Output Summary (Last 24 hours) at 11/12/2020 1101 Last data filed at 11/12/2020 0254 Gross per 24 hour  Intake 247 ml  Output 500 ml  Net -253 ml   Filed Weights   11/10/20 0500 11/11/20 0500 11/12/20 0255  Weight: 135.5 kg (!) 136.5 kg 136 kg    Examination:  Constitutional: NAD, in bed Eyes: no icterus  ENMT: mmm Neck: normal, supple Respiratory: Clear to auscultation bilaterally, no wheezing or crackles Cardiovascular: rrr, no mrg, 1+ edema  Abdomen: soft, nt, bs+ Musculoskeletal: no clubbing / cyanosis.  Skin: no rashes Neurologic: no new deficits  Data Reviewed: I have independently reviewed following labs and imaging studies   CBC: Recent Labs  Lab 11/07/20 1508 11/07/20 2124 11/10/20 0839 11/10/20 2138 11/11/20 0635 11/11/20 1728 11/12/20 0207  WBC 8.3   < > 6.3 7.6 6.1 6.4 5.5  NEUTROABS 7.0  --   --   --   --   --   --   HGB 11.6*   < > 8.2* 8.7* 8.2* 8.5* 8.8*  HCT 37.1*   < > 25.6* 26.7* 25.4* 26.9* 28.2*  MCV 93.9   < > 94.1 91.8 94.8 96.4 95.3  PLT 266   < > 169 152 142* 156 137*   < > = values in this interval not  displayed.   Basic Metabolic Panel: Recent Labs  Lab 11/08/20 0446 11/09/20 0614 11/10/20 0839 11/11/20 0635 11/12/20 0207  NA 147* 153* 149* 145 146*  K 3.8 2.9* 3.2* 3.6 3.9  CL 107 115* 114* 112* 111  CO2 30 30 31 25 26   GLUCOSE 124* 115* 95 106* 93  BUN 41* 21 16 17 20   CREATININE 1.49* 1.16 1.18 1.14 1.23  CALCIUM 7.2* 7.7* 7.9* 8.0* 8.3*   Liver Function Tests: Recent Labs  Lab 11/07/20 1508  11/08/20 0446 11/09/20 0614 11/10/20 0839  AST 30 21 19 29   ALT 21 15 13 16   ALKPHOS 86 60 58 57  BILITOT 1.5* 2.0* 1.7* 1.3*  PROT 6.4* 4.9* 5.4* 5.5*  ALBUMIN 2.6* 2.1* 3.0* 2.7*   Coagulation Profile: Recent Labs  Lab 11/07/20 1508  INR 1.0   HbA1C: No results for input(s): HGBA1C in the last 72 hours. CBG: No results for input(s): GLUCAP in the last 168 hours.  Recent Results (from the past 240 hour(s))  MRSA PCR Screening     Status: Abnormal   Collection Time: 11/09/20 11:18 PM   Specimen: Nasal Mucosa; Nasopharyngeal  Result Value Ref Range Status   MRSA by PCR POSITIVE (A) NEGATIVE Final    Comment:        The GeneXpert MRSA Assay (FDA approved for NASAL specimens only), is one component of a comprehensive MRSA colonization surveillance program. It is not intended to diagnose MRSA infection nor to guide or monitor treatment for MRSA infections. RESULT CALLED TO, READ BACK BY AND VERIFIED WITH: WALTON C. 01.20.22 @ 0121 BY MECIAL J. Performed at [facility], [address] [place], [place]      Radiology Studies: CT HEAD WO CONTRAST  Result Date: 11/11/2020 CLINICAL DATA:  Mental status change, unknown cause EXAM: CT HEAD WITHOUT CONTRAST TECHNIQUE: Contiguous axial images were obtained from the base of the skull through the vertex without intravenous contrast. COMPARISON:  10/25/2020. FINDINGS: Brain: No acute infarct or intracranial hemorrhage. No mass lesion. No midline shift, ventriculomegaly or extra-axial fluid  collection. Left cerebral encephalomalacia with ex vacuo dilatation of the left lateral ventricle, unchanged. Vascular: No hyperdense vessel or unexpected calcification. Skull: Chronic post traumatic/postsurgical sequela of the left calvarium. Sinuses/Orbits: Normal orbits. Right maxillary sinus mucous retention cyst. Other: None. IMPRESSION: No acute intracranial finding. Stable appearance of chronic findings. Electronically Signed   By: 11/13/2020 M.D.   On: 11/11/2020 15:53    Stana Bunting, MD, PhD [facility]  Between 7 am - 7 pm I am available, please contact me via Amion or Securechat  Between 7 pm - 7 am I am not available, please contact night coverage MD/APP via Amion

## 2020-11-12 NOTE — Progress Notes (Signed)
   Patient Name: Austin Hartman Date of Encounter: 11/12/2020, 10:36 AM    Subjective  Brown stool per nursing staff   Objective  BP 96/60 (BP Location: Right Arm)   Pulse (!) 104   Temp 99.2 F (37.3 C) (Axillary)   Resp 16   Ht 6\' 4"  (1.93 m)   Wt 136 kg   SpO2 96%   BMI 36.50 kg/m   CBC Latest Ref Rng & Units 11/12/2020 11/11/2020 11/11/2020  WBC 4.0 - 10.5 K/uL 5.5 6.4 6.1  Hemoglobin 13.0 - 17.0 g/dL 11/13/2020) 0.5(W) 9.7(X)  Hematocrit 39.0 - 52.0 % 28.2(L) 26.9(L) 25.4(L)  Platelets 150 - 400 K/uL 137(L) 156 142(L)      Assessment and Plan  Colitis thought to be ischemic, bleeding worse when on Plavix he is now without bleeding and hemoglobin is stable.  He has received blood and platelets and Plavix is held.  Continue conservative care.  We will recheck him Monday.  Unless there are further problems I do not anticipate rescoping him given his overall set of comorbidities.  Suggest leaving off clopidogrel for a total of 2 weeks and restarting barring other problems.  Dr. Tuesday is on call this weekend for the remainder  Myrtie Neither, MD, [facility] Gastroenterology 11/12/2020 10:36 AM

## 2020-11-13 LAB — CBC
HCT: 27.1 % — ABNORMAL LOW (ref 39.0–52.0)
HCT: 29 % — ABNORMAL LOW (ref 39.0–52.0)
Hemoglobin: 8.3 g/dL — ABNORMAL LOW (ref 13.0–17.0)
Hemoglobin: 8.6 g/dL — ABNORMAL LOW (ref 13.0–17.0)
MCH: 30.2 pg (ref 26.0–34.0)
MCH: 30.3 pg (ref 26.0–34.0)
MCHC: 30.6 g/dL (ref 30.0–36.0)
MCHC: 29.7 g/dL — ABNORMAL LOW (ref 30.0–36.0)
MCV: 98.5 fL (ref 80.0–100.0)
MCV: 102.1 fL — ABNORMAL HIGH (ref 80.0–100.0)
Platelets: 115 10*3/uL — ABNORMAL LOW (ref 150–400)
Platelets: 145 10*3/uL — ABNORMAL LOW (ref 150–400)
RBC: 2.75 MIL/uL — ABNORMAL LOW (ref 4.22–5.81)
RBC: 2.84 MIL/uL — ABNORMAL LOW (ref 4.22–5.81)
RDW: 19.7 % — ABNORMAL HIGH (ref 11.5–15.5)
RDW: 19.6 % — ABNORMAL HIGH (ref 11.5–15.5)
WBC: 3.9 10*3/uL — ABNORMAL LOW (ref 4.0–10.5)
WBC: 4 10*3/uL (ref 4.0–10.5)
nRBC: 0 % (ref 0.0–0.2)
nRBC: 0 % (ref 0.0–0.2)

## 2020-11-13 LAB — BASIC METABOLIC PANEL
Anion gap: 10 (ref 5–15)
BUN: 19 mg/dL (ref 8–23)
CO2: 23 mmol/L (ref 22–32)
Calcium: 8.6 mg/dL — ABNORMAL LOW (ref 8.9–10.3)
Chloride: 111 mmol/L (ref 98–111)
Creatinine, Ser: 1.25 mg/dL — ABNORMAL HIGH (ref 0.61–1.24)
GFR, Estimated: >60 mL/min (ref >60)
Glucose, Bld: 94 mg/dL (ref 70–99)
Potassium: 4 mmol/L (ref 3.5–5.1)
Sodium: 144 mmol/L (ref 135–145)

## 2020-11-13 NOTE — Progress Notes (Signed)
Called pt's sister and gave her an update. She also talked with patient

## 2020-11-13 NOTE — Progress Notes (Signed)
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  He tested positive for COVID-19 on 1/3, discharged on 1/11.  He was recommended 21 days of isolation up until 1/24  Subjective / 24h Interval events: No significant complaints this morning.  States that he feels well.  No reports of bleeding overnight  Assessment & Plan: Principal Problem Bright red blood per rectum, acute blood loss anemia, hemorrhagic shock -Patient was admitted to the hospital with sudden onset of bright red blood per rectum, unclear whether had some associated pain due to being a poor historian.  Underwent a CT angiogram x2 due to ongoing bleeding without a cause.  GI consulted and patient underwent a flexible sigmoidoscopy on 1/18 which showed congested, eroded, plaque covered and ulcerated mucosa in the sigmoid colon with many punched out ulcers, unclear etiology whether this is atypical ischemia versus viral induced given recent Covid.  No lesions amenable to interventions.  He was also quite hypotensive requiring peripheral vasopressors  -Received a total of 5 units of packed red blood cells and platelets due to platelet dysfunction in the setting of recent Plavix -No further bleeding over the past 48 hours.  Continue to monitor CBC.  Will transfer to floor  Active Problems Recent COVID-19 infection -Currently no respiratory symptoms, continue isolation up until 1/24. Remains on room air  TBI, seizure disorder, speech impairment-TBI at age 40, chronic right-sided weakness and speech difficulties. Continue  Keppra, Lamictal and rest of home medications.  He is wearing a helmet -CT scan done 1/21 without acute findings  Hypokalemia  -Due to GI losses, potassium has now normalized  A. Fib/flutter -Not anticoagulated due to history of SDH and now GI bleed. Continue amiodarone, digoxin  Right leg pain, swelling -Lower extremity Doppler did not show any evidence of DVT thankfully.  Has not been complaining of pain for the past couple of days  CAD -Hold aspirin and Plavix in the setting of bleed. Per GI hold for 2 weeks and then resume  Chronic diastolic CHF -Currently appears euvolemic  Chronic kidney disease stage II -Baseline about 1.2, currently at baseline  Hypernatremia -Mild, encourage p.o. intake   Scheduled Meds: . (feeding supplement) PROSource Plus  30 mL Oral Daily  . sodium chloride   Intravenous Once  . sodium chloride   Intravenous Once  . sodium chloride   Intravenous Once  . amiodarone  200 mg Oral Daily  . atorvastatin  20 mg Oral Daily  . Chlorhexidine Gluconate Cloth  6 each Topical Q0600  . cloBAZam  40 mg Oral Daily  . digoxin  125 mcg Oral Daily  . feeding supplement  1 Container Oral Q24H  . feeding supplement  237 mL Oral BID BM  . Ipratropium-Albuterol  2 puff Inhalation BID  . lamoTRIgine  400 mg Oral BID  . levETIRAcetam  1,250 mg Oral Daily   And  . levETIRAcetam  1,000 mg Oral QHS  . levothyroxine  112 mcg Oral Q0600  . mouth rinse  15 mL Mouth Rinse BID  . mupirocin ointment  1 application Nasal BID  . pantoprazole (PROTONIX)  IV  40 mg Intravenous Q24H   Continuous Infusions: . sodium chloride Stopped (11/09/20 1703)  . sodium chloride Stopped (11/10/20 2108)  . norepinephrine (LEVOPHED) Adult infusion Stopped (11/10/20 2058)   PRN Meds:.ondansetron **OR** ondansetron (ZOFRAN) IV, traMADol  Diet Orders (From admission, onward)    Start     Ordered   11/09/20 1004  Diet full liquid Room service appropriate? Yes; Fluid consistency: Thin  Diet  effective now       Question Answer Comment  Room service appropriate? Yes   Fluid consistency: Thin      11/09/20 1003          DVT prophylaxis: SCDs Start: 11/07/20 2120     Code Status: DNR  Family Communication: d/w patient, updated sister over the phone  Status is: Inpatient  Remains inpatient appropriate because:Inpatient level of care appropriate due to severity of illness  Dispo: The patient is from: SNF              Anticipated d/c is to: SNF              Anticipated d/c date is: 3 days              Patient currently is not medically stable to d/c.  Consultants:  GI  Procedures:  Flex sig 1/19  Microbiology  None   Antimicrobials: None     Objective: Vitals:   11/13/20 0700 11/13/20 0800 11/13/20 0832 11/13/20 0900  BP:  112/72    Pulse: 97 97  98  Resp: 17 15  13   Temp:   97.7 F (36.5 C)   TempSrc:   Axillary   SpO2: 95% 98%  98%  Weight:      Height:        Intake/Output Summary (Last 24 hours) at 11/13/2020 1003 Last data filed at 11/13/2020 0000 Gross per 24 hour  Intake --  Output 700 ml  Net -700 ml   Filed Weights   11/11/20 0500 11/12/20 0255 11/13/20 0500  Weight: (!) 136.5 kg 136 kg 135.5 kg    Examination:  Constitutional: No distress, in bed Eyes: No scleral icterus ENMT: Moist mucous membranes Neck: normal, supple Respiratory: Clear bilaterally, no wheezing, no crackles Cardiovascular: Regular rate and rhythm, no murmurs, 1+ edema Abdomen: Soft, nontender, nondistended, bowel sounds positive Musculoskeletal: no clubbing / cyanosis.  Skin: No rashes seen Neurologic: No neurodeficits  Data Reviewed: I have independently reviewed following labs and imaging studies   CBC: Recent Labs  Lab 11/07/20 1508 11/07/20 2124 11/11/20 0635 11/11/20 1728 11/12/20 0207 11/12/20 1642 11/13/20 0718  WBC 8.3   < > 6.1 6.4 5.5 3.8* 3.9*  NEUTROABS 7.0  --   --   --   --   --   --   HGB 11.6*   < > 8.2* 8.5* 8.8* 8.0* 8.3*   HCT 37.1*   < > 25.4* 26.9* 28.2* 26.1* 27.1*  MCV 93.9   < > 94.8 96.4 95.3 98.1 98.5  PLT 266   < > 142* 156 137* 147* 115*   < > = values in this interval not displayed.   Basic Metabolic Panel: Recent Labs  Lab 11/09/20 0614 11/10/20 0839 11/11/20 0635 11/12/20 0207 11/13/20 0248  NA 153* 149* 145 146* 144  K 2.9* 3.2* 3.6 3.9 4.0  CL 115* 114* 112* 111 111  CO2 30 31 25 26 23   GLUCOSE 115* 95 106* 93 94  BUN 21 16 17 20  19  CREATININE 1.16 1.18 1.14 1.23 1.25*  CALCIUM 7.7* 7.9* 8.0* 8.3* 8.6*   Liver Function Tests: Recent Labs  Lab 11/07/20 1508 11/08/20 0446 11/09/20 0614 11/10/20 0839  AST 30 21 19 29   ALT 21 15 13 16   ALKPHOS 86 60 58 57  BILITOT 1.5* 2.0* 1.7* 1.3*  PROT 6.4* 4.9* 5.4* 5.5*  ALBUMIN 2.6* 2.1* 3.0* 2.7*   Coagulation Profile: Recent Labs  Lab 11/07/20 1508  INR 1.0   HbA1C: No results for input(s): HGBA1C in the last 72 hours. CBG: No results for input(s): GLUCAP in the last 168 hours.  Recent Results (from the past 240 hour(s))  MRSA PCR Screening     Status: Abnormal   Collection Time: 11/09/20 11:18 PM   Specimen: Nasal Mucosa; Nasopharyngeal  Result Value Ref Range Status   MRSA by PCR POSITIVE (A) NEGATIVE Final    Comment:        The GeneXpert MRSA Assay (FDA approved for NASAL specimens only), is one component of a comprehensive MRSA colonization surveillance program. It is not intended to diagnose MRSA infection nor to guide or monitor treatment for MRSA infections. RESULT CALLED TO, READ BACK BY AND VERIFIED WITH: WALTON C. 01.20.22 @ 0121 BY MECIAL J. Performed at [facility], [address], [place], [place]      Radiology Studies: No results found.  [place], MD, PhD [facility]  Between 7 am - 7 pm I am available, please contact me via Amion or Securechat  Between 7 pm - 7 am I am not available, please contact night coverage MD/APP via Amion

## 2020-11-14 DIAGNOSIS — K922 Gastrointestinal hemorrhage, unspecified: Secondary | ICD-10-CM | POA: Diagnosis not present

## 2020-11-14 DIAGNOSIS — K559 Vascular disorder of intestine, unspecified: Secondary | ICD-10-CM | POA: Diagnosis not present

## 2020-11-14 LAB — CBC
HCT: 27.8 % — ABNORMAL LOW (ref 39.0–52.0)
Hemoglobin: 8.7 g/dL — ABNORMAL LOW (ref 13.0–17.0)
MCH: 30.1 pg (ref 26.0–34.0)
MCHC: 31.3 g/dL (ref 30.0–36.0)
MCV: 96.2 fL (ref 80.0–100.0)
Platelets: 151 10*3/uL (ref 150–400)
RBC: 2.89 MIL/uL — ABNORMAL LOW (ref 4.22–5.81)
RDW: 19.3 % — ABNORMAL HIGH (ref 11.5–15.5)
WBC: 4.2 10*3/uL (ref 4.0–10.5)
nRBC: 0 % (ref 0.0–0.2)

## 2020-11-14 LAB — BASIC METABOLIC PANEL
Anion gap: 9 (ref 5–15)
BUN: 17 mg/dL (ref 8–23)
CO2: 24 mmol/L (ref 22–32)
Calcium: 8.3 mg/dL — ABNORMAL LOW (ref 8.9–10.3)
Chloride: 113 mmol/L — ABNORMAL HIGH (ref 98–111)
Creatinine, Ser: 1.1 mg/dL (ref 0.61–1.24)
GFR, Estimated: >60 mL/min (ref >60)
Glucose, Bld: 83 mg/dL (ref 70–99)
Potassium: 4.7 mmol/L (ref 3.5–5.1)
Sodium: 146 mmol/L — ABNORMAL HIGH (ref 135–145)

## 2020-11-14 NOTE — Progress Notes (Signed)
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  He tested positive for COVID-19 on 1/3, discharged back to SNF on 1/11.  He was recommended 21 days of isolation up until 1/24    PT Comments    Attempted bed level exercises with pt. He does not appear to be appropriate for acute PT services. He was not able to participate meaningfully with PT on today. Pt is a long term SNF resident. Will defer any further PT needs to SNF. Recommend OOB to chair with lift equipment with nursing. Will sign off at this time.    Follow Up Recommendations   (return to SNF)     Equipment Recommendations  None recommended by PT    Recommendations for Other Services       Precautions / Restrictions Precautions Precautions: Fall Precaution Comments: seizure, craniotomy (needs helmet)    Mobility  Bed Mobility               General bed mobility comments: NT-bed level exercises only  Transfers                    Ambulation/Gait                 Stairs             Wheelchair Mobility    Modified Rankin (Stroke Patients Only)       Balance                                            Cognition Arousal/Alertness: Awake/alert Behavior During Therapy: WFL for tasks assessed/performed Overall Cognitive Status: No family/caregiver present to determine baseline cognitive functioning                                        Exercises General Exercises - Upper Extremity Shoulder Flexion: AAROM;Both;5 reps Elbow  Flexion: AAROM;Both;5 reps General Exercises - Lower Extremity Ankle Circles/Pumps: PROM;Both;5 reps Heel Slides: AAROM;Both;5 reps Straight Leg Raises: AAROM;Left;5 reps    General Comments        Pertinent Vitals/Pain Pain Assessment: Faces Faces Pain Scale: No hurt    Home Living                      Prior Function            PT Goals (current goals can now be found in the care plan section) Progress towards PT goals: Not progressing toward goals - comment (LTC SNF resident-not participating meaningfully with acute therapy. Discharge from PT)    Frequency           PT Plan      Co-evaluation              AM-PAC PT "6 Clicks" Mobility   Outcome Measure  Help needed turning from your back to your side while in a flat bed without using  bedrails?: Total Help needed moving from lying on your back to sitting on the side of a flat bed without using bedrails?: Total Help needed moving to and from a bed to a chair (including a wheelchair)?: Total Help needed standing up from a chair using your arms (e.g., wheelchair or bedside chair)?: Total Help needed to walk in hospital room?: Total Help needed climbing 3-5 steps with a railing? : Total 6 Click Score: 6    End of Session   Activity Tolerance: Patient tolerated treatment well Patient left: in bed   PT Visit Diagnosis: Difficulty in walking, not elsewhere classified (R26.2);Hemiplegia and hemiparesis;Muscle weakness (generalized) (M62.81) Hemiplegia - Right/Left: Right     Time: 7654-6503 PT Time Calculation (min) (ACUTE ONLY): 11 min  Charges:  $Therapeutic Exercise: 8-22 mins                         Faye Ramsay, PT Acute Rehabilitation  Office: [phone] Pager: [phone]

## 2020-11-14 NOTE — Progress Notes (Signed)
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  He tested positive for COVID-19 on 1/3, discharged on 1/11.  He was recommended 21 days of isolation up until 1/24  Subjective / 24h Interval events: Bleeding seems to have stopped.  He was soiled when I was in the room stool looks brown.  He has no complaints  Assessment & Plan: Principal Problem Bright red blood per rectum, acute blood loss anemia, hemorrhagic shock -Patient was admitted to the hospital with sudden onset of bright red blood per rectum, unclear whether had some associated pain due to being a poor historian.  Underwent a CT angiogram x2 due to ongoing bleeding without a cause.  GI consulted and patient underwent a flexible sigmoidoscopy on 1/18 which showed congested, eroded, plaque covered and ulcerated mucosa in the sigmoid colon with many punched out ulcers, unclear etiology whether this is atypical ischemia versus viral induced given recent Covid.  No lesions amenable to interventions.  He was also quite hypotensive requiring peripheral vasopressors  -Received a total of 5 units of packed red blood cells and platelets due to platelet dysfunction in the setting of recent Plavix -No further bleeding, check CBC in a.m. and if stable can be discharged to [facility] 1/25  Active Problems Recent COVID-19 infection -21 days have passed, discontinue precautions.  Visit.  TBI, seizure disorder, speech impairment-TBI at age 70, chronic right-sided weakness and speech difficulties. Continue Keppra, Lamictal and rest of home  medications.  He is wearing a helmet -CT scan done 1/21 without acute findings  Hypokalemia  -Due to GI losses, potassium has now normalized  A. Fib/flutter -Not anticoagulated due to history of SDH and now GI bleed. Continue amiodarone, digoxin  Right leg pain, swelling -Lower extremity Doppler did not show any evidence of DVT thankfully.  Has not been complaining of pain for the past couple of days  CAD -Hold Plavix in the setting of bleed. Per GI hold for 2 weeks and then resume  Chronic diastolic CHF -Currently appears euvolemic  Chronic kidney disease stage II -Baseline about 1.2, currently at baseline  Hypernatremia -Mild, encourage p.o. intake   Scheduled Meds: . (feeding supplement) PROSource Plus  30 mL Oral Daily  . sodium chloride   Intravenous Once  . sodium chloride   Intravenous Once  . sodium chloride   Intravenous Once  . amiodarone  200 mg Oral Daily  . atorvastatin  20 mg Oral Daily  . cloBAZam  40 mg Oral Daily  . digoxin  125 mcg Oral Daily  . feeding supplement  1 Container Oral Q24H  . feeding supplement  237 mL Oral BID BM  . Ipratropium-Albuterol  2 puff Inhalation BID  . lamoTRIgine  400 mg Oral BID  . levETIRAcetam  1,250 mg Oral Daily   And  . levETIRAcetam  1,000 mg Oral QHS  . levothyroxine  112 mcg Oral Q0600  . mouth rinse  15 mL Mouth Rinse BID  . mupirocin ointment  1 application Nasal BID  . pantoprazole (PROTONIX) IV  40 mg Intravenous Q24H   Continuous Infusions: . sodium chloride  Stopped (11/09/20 1703)  . sodium chloride Stopped (11/10/20 2108)   PRN Meds:.ondansetron **OR** ondansetron (ZOFRAN) IV, traMADol  Diet Orders (From admission, onward)    Start     Ordered   11/09/20 1004  Diet full liquid Room service appropriate? Yes; Fluid consistency: Thin  Diet effective now       Question Answer Comment  Room service appropriate? Yes   Fluid consistency: Thin      11/09/20 1003          DVT prophylaxis: SCDs Start:  11/07/20 2120     Code Status: DNR  Family Communication: d/w patient, updated sister over the phone  Status is: Inpatient  Remains inpatient appropriate because:Inpatient level of care appropriate due to severity of illness  Dispo: The patient is from: SNF              Anticipated d/c is to: SNF              Anticipated d/c date is: 3 days              Patient currently is not medically stable to d/c.  Consultants:  GI  Procedures:  Flex sig 1/19  Microbiology  None   Antimicrobials: None     Objective: Vitals:   11/13/20 2030 11/13/20 2150 11/14/20 0312 11/14/20 0642  BP: 135/69 130/63 109/69 132/62  Pulse: 98 96 95 76  Resp: 15 18 18    Temp:  99 F (37.2 C) 98.4 F (36.9 C) 98.6 F (37 C)  TempSrc:  Axillary Oral Oral  SpO2: 99% 100% 99% 100%  Weight:    (!) 138.4 kg  Height:        Intake/Output Summary (Last 24 hours) at 11/14/2020 1121 Last data filed at 11/14/2020 0900 Gross per 24 hour  Intake 545 ml  Output 750 ml  Net -205 ml   Filed Weights   11/12/20 0255 11/13/20 0500 11/14/20 0642  Weight: 136 kg 135.5 kg (!) 138.4 kg    Examination:  Constitutional: NAD, in bed Eyes: No scleral icterus ENMT: Moist mucous membranes Neck: normal, supple Respiratory: Clear bilaterally, no wheezing, crackles Cardiovascular: No murmurs, 1+ edema Abdomen: Soft, NT, ND, bowel sounds positive Musculoskeletal: no clubbing / cyanosis.  Skin: No rashes seen Neurologic: No new focal deficits  Data Reviewed: I have independently reviewed following labs and imaging studies   CBC: Recent Labs  Lab 11/07/20 1508 11/07/20 2124 11/12/20 0207 11/12/20 1642 11/13/20 0718 11/13/20 1612 11/14/20 0457  WBC 8.3   < > 5.5 3.8* 3.9* 4.0 4.2  NEUTROABS 7.0  --   --   --   --   --   --   HGB 11.6*   < > 8.8* 8.0* 8.3* 8.6* 8.7*  HCT 37.1*   < > 28.2* 26.1* 27.1* 29.0* 27.8*  MCV 93.9   < > 95.3 98.1 98.5 102.1* 96.2  PLT 266   < > 137* 147* 115* 145* 151   < >  = values in this interval not displayed.   Basic Metabolic Panel: Recent Labs  Lab 11/10/20 0839 11/11/20 0635 11/12/20 0207 11/13/20 0248 11/14/20 0457  NA 149* 145 146* 144 146*  K 3.2* 3.6 3.9 4.0 4.7  CL 114* 112* 111 111 113*  CO2 31 25 26 23 24   GLUCOSE 95 106* 93 94 83  BUN 16 17 20 19 17   CREATININE 1.18 1.14 1.23 1.25* 1.10  CALCIUM 7.9* 8.0* 8.3* 8.6* 8.3*   Liver Function  Tests: Recent Labs  Lab 11/07/20 1508 11/08/20 0446 11/09/20 0614 11/10/20 0839  AST 30 21 19 29   ALT 21 15 13 16   ALKPHOS 86 60 58 57  BILITOT 1.5* 2.0* 1.7* 1.3*  PROT 6.4* 4.9* 5.4* 5.5*  ALBUMIN 2.6* 2.1* 3.0* 2.7*   Coagulation Profile: Recent Labs  Lab 11/07/20 1508  INR 1.0   HbA1C: No results for input(s): HGBA1C in the last 72 hours. CBG: No results for input(s): GLUCAP in the last 168 hours.  Recent Results (from the past 240 hour(s))  MRSA PCR Screening     Status: Abnormal   Collection Time: 11/09/20 11:18 PM   Specimen: Nasal Mucosa; Nasopharyngeal  Result Value Ref Range Status   MRSA by PCR POSITIVE (A) NEGATIVE Final    Comment:        The GeneXpert MRSA Assay (FDA approved for NASAL specimens only), is one component of a comprehensive MRSA colonization surveillance program. It is not intended to diagnose MRSA infection nor to guide or monitor treatment for MRSA infections. RESULT CALLED TO, READ BACK BY AND VERIFIED WITH: WALTON C. 01.20.22 @ 0121 BY MECIAL J. Performed at [facility], [address], [place], [place]      Radiology Studies: No results found.  [place], MD, PhD [facility]  Between 7 am - 7 pm I am available, please contact me via Amion or Securechat  Between 7 pm - 7 am I am not available, please contact night coverage MD/APP via Amion

## 2020-11-14 NOTE — Progress Notes (Signed)
Patien taken off Airborne and Contact Precautions

## 2020-11-14 NOTE — [place] FL2 (Signed)
[place] MEDICAID FL2 LEVEL OF CARE SCREENING TOOL     IDENTIFICATION  Patient Name: Austin Hartman Birthdate: 09-23-1951 Sex: male Admission Date (Current Location): 11/07/2020  [facility] and [place] Number:  Producer, television/film/video and Address:  [facility],  [address] [place] [address]      Provider Number: [id number]  Attending Physician Name and Address:  Leatha Gilding, MD  Relative Name and Phone Number:  Reece, Fehnel (Sister)   [phone]    Current Level of Care: Hospital Recommended Level of Care: Skilled Nursing Facility Prior Approval Number:    Date Approved/Denied:   PASRR Number:    Discharge Plan: SNF    Current Diagnoses: Patient Active Problem List   Diagnosis Date Noted  . Ischemic colitis (HCC)   . Hemorrhagic shock (HCC) 11/08/2020  . Colitis   . Acute GI bleeding 11/07/2020  . 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 Chronic diastolic CHF (congestive heart failure) (HCC) 11/07/2020  . Pressure injury of skin 10/26/2020  . Abdominal distention   . Acute respiratory failure with hypoxia (HCC)   . ARF (acute renal failure) (HCC)   . COVID-19   . On amiodarone therapy 01/21/2018  . Chronic atrial fibrillation (HCC) 06/06/2017  . High risk medication use 06/06/2017  . Subdural hematoma, chronic (HCC) 06/06/2017  . 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 Typical atrial flutter (HCC) 05/07/2016  . Chronic constipation 03/29/2016  . Altered mental state 03/29/2016  . Epilepsy (HCC) 03/29/2016  . Abdominal pain, generalized 03/29/2016  . Hypotension, unspecified 03/29/2016  . Left hemiparesis (HCC) 03/29/2016  . At high risk for aspiration 03/29/2016  . DNR (do not resuscitate) 03/29/2016  . TBI (traumatic brain injury) (HCC)   . Dysphagia   . Coronary artery disease involving native  coronary artery of native heart with angina pectoris (HCC)   . Diarrhea   . Hypertensive heart disease with heart failure (HCC)   . History of traumatic brain injury   . Hyperlipidemia 06/30/2015  . Hemiplegia (HCC) 12/13/2011  . Partial epilepsy with impairment of consciousness, intractable (HCC) 12/13/2011    Orientation RESPIRATION BLADDER Height & Weight     Self,Place  Normal External catheter Weight: (!) 138.4 kg Height:  6\' 4"  (193 cm)  BEHAVIORAL SYMPTOMS/MOOD NEUROLOGICAL BOWEL NUTRITION STATUS    Convulsions/Seizures Incontinent Diet  AMBULATORY STATUS COMMUNICATION OF NEEDS Skin   Extensive Assist Verbally Other (Comment) (pressure injury coccyx and skin tear to buttocks)                       Personal Care Assistance Level of Assistance  Bathing,Feeding,Dressing Bathing Assistance: Maximum assistance Feeding assistance: Limited assistance Dressing Assistance: Maximum assistance     Functional Limitations Info  Sight,Hearing,Speech Sight Info: Adequate Hearing Info: Adequate Speech Info: Adequate    SPECIAL CARE FACTORS FREQUENCY                       Contractures Contractures Info: Not present    Additional Factors Info  Allergies,Code Status Code Status Info: DNR Allergies Info: acetaminophen, Azithromycin, Biaxin, Diltiazem, Verapamil           Current Medications (11/14/2020):  This is the current hospital active medication list Current Facility-Administered Medications  Medication Dose Route Frequency Provider Last Rate Last Admin  . (feeding supplement) PROSource Plus liquid 30 mL  30  mL Oral Daily Leatha Gilding, MD   30 mL at 11/13/20 1829  . 0.9 %  sodium chloride infusion (Manually program via Guardrails IV Fluids)   Intravenous Once Iva Boop, MD   Held at 11/07/20 1815  . 0.9 %  sodium chloride infusion (Manually program via Guardrails IV Fluids)   Intravenous Once Iva Boop, MD   Held at 11/07/20 2308  . 0.9 %   sodium chloride infusion (Manually program via Guardrails IV Fluids)   Intravenous Once Iva Boop, MD   Held at 11/08/20 0124  . 0.9 %  sodium chloride infusion  250 mL Intravenous Continuous Iva Boop, MD   Stopped at 11/09/20 1703  . 0.9 %  sodium chloride infusion  250 mL Intravenous Continuous Leatha Gilding, MD   Paused at 11/10/20 2108  . amiodarone (PACERONE) tablet 200 mg  200 mg Oral Daily Iva Boop, MD   200 mg at 11/13/20 1005  . atorvastatin (LIPITOR) tablet 20 mg  20 mg Oral Daily Iva Boop, MD   20 mg at 11/13/20 1004  . cloBAZam (ONFI) tablet 40 mg  40 mg Oral Daily Iva Boop, MD   40 mg at 11/13/20 1006  . digoxin (LANOXIN) tablet 125 mcg  125 mcg Oral Daily Iva Boop, MD   125 mcg at 11/13/20 1005  . feeding supplement (BOOST / RESOURCE BREEZE) liquid 1 Container  1 Container Oral Q24H Leatha Gilding, MD   1 Container at 11/13/20 1829  . feeding supplement (ENSURE ENLIVE / ENSURE PLUS) liquid 237 mL  237 mL Oral BID BM Leatha Gilding, MD   237 mL at 11/13/20 2038  . Ipratropium-Albuterol (COMBIVENT) respimat 2 puff  2 puff Inhalation BID Iva Boop, MD   2 puff at 11/13/20 0810  . lamoTRIgine (LAMICTAL) tablet 400 mg  400 mg Oral BID Iva Boop, MD   400 mg at 11/13/20 2039  . levETIRAcetam (KEPPRA) tablet 1,250 mg  1,250 mg Oral Daily Iva Boop, MD   1,250 mg at 11/13/20 1005   And  . levETIRAcetam (KEPPRA) tablet 1,000 mg  1,000 mg Oral QHS Iva Boop, MD   1,000 mg at 11/13/20 2038  . levothyroxine (SYNTHROID) tablet 112 mcg  112 mcg Oral Q0600 Iva Boop, MD   112 mcg at 11/14/20 [id number]  . MEDLINE mouth rinse  15 mL Mouth Rinse BID Leatha Gilding, MD   15 mL at 11/13/20 1006  . mupirocin ointment (BACTROBAN) 2 % 1 application  1 application Nasal BID Leatha Gilding, MD   1 application at 11/13/20 2259  . ondansetron (ZOFRAN) tablet 4 mg  4 mg Oral Q6H PRN Iva Boop, MD       Or  . ondansetron  [facility]) injection 4 mg  4 mg Intravenous Q6H PRN Iva Boop, MD      . pantoprazole (PROTONIX) injection 40 mg  40 mg Intravenous Q24H Iva Boop, MD   40 mg at 11/13/20 1829  . traMADol (ULTRAM) tablet 25 mg  25 mg Oral Q6H PRN Leatha Gilding, MD   25 mg at 11/11/20 1027     Discharge Medications: Please see discharge summary for a list of discharge medications.  Relevant Imaging Results:  Relevant Lab Results:   Additional Information ss#[id number].  Armanda Heritage, RN

## 2020-11-14 NOTE — Plan of Care (Signed)
  Problem: Clinical Measurements: Goal: Complications related to the disease process, condition or treatment will be avoided or minimized Outcome: Progressing   

## 2020-11-14 NOTE — Plan of Care (Signed)
  Problem: Bowel/Gastric: Goal: Will show no signs and symptoms of gastrointestinal bleeding Outcome: Progressing   Problem: Fluid Volume: Goal: Will show no signs and symptoms of excessive bleeding Outcome: Progressing   Problem: Clinical Measurements: Goal: Complications related to the disease process, condition or treatment will be avoided or minimized Outcome: Progressing   

## 2020-11-14 NOTE — Care Management Important Message (Signed)
Medicare important message printed for Austin Hartman, NCM to give to the patient. 

## 2020-11-14 NOTE — Progress Notes (Signed)
    Progress Note   Subjective  Chief Complaint: Colitis with hematochezia  This morning patient tells me he had a stool overnight which was "a lot".  No further reports of hematochezia.    Objective   Vital signs in last 24 hours: Temp:  [97.6 F (36.4 C)-99 F (37.2 C)] 98.6 F (37 C) (01/24 9924) Pulse Rate:  [76-100] 76 (01/24 0642) Resp:  [9-23] 18 (01/24 0312) BP: (109-135)/(62-76) 132/62 (01/24 0642) SpO2:  [94 %-100 %] 100 % (01/24 0642) Weight:  [138.4 kg] 138.4 kg (01/24 0642) Last BM Date: 11/14/20 General:    white male in NAD Heart:  Regular rate and rhythm; no murmurs Lungs: Respirations even and unlabored, lungs CTA bilaterally Abdomen:  Soft, nontender and nondistended. Normal bowel sounds. Psych:  Cooperative.  Intake/Output from previous day: 01/23 0701 - 01/24 0700 In: 425 [P.O.:425] Out: 750 [Urine:750]  Lab Results: Recent Labs    11/13/20 0718 11/13/20 1612 11/14/20 0457  WBC 3.9* 4.0 4.2  HGB 8.3* 8.6* 8.7*  HCT 27.1* 29.0* 27.8*  PLT 115* 145* 151   BMET Recent Labs    11/12/20 0207 11/13/20 0248 11/14/20 0457  NA 146* 144 146*  K 3.9 4.0 4.7  CL 111 111 113*  CO2 26 23 24   GLUCOSE 93 94 83  BUN 20 19 17   CREATININE 1.23 1.25* 1.10  CALCIUM 8.3* 8.6* 8.3*    Assessment / Plan:   Assessment: 1.  Colitis: Thought to be ischemic, bleeding worse when on Plavix which has been held, he is now without bleeding, hemoglobin is stable, has received blood and platelets  Plan: 1.  Per Dr. suggest leaving the patient off of Plavix for total of 2 weeks and restarting barring other problems 2.  Continue to monitor hemoglobin while here, but it appears that he is stable 3.  Please await any final recommendations from Dr. later today, we will sign off.  Thank you for your kind consultation.    LOS: 7 days   Leone Payor  11/14/2020, 9:57 AM

## 2020-11-15 LAB — CBC
HCT: 28.5 % — ABNORMAL LOW (ref 39.0–52.0)
Hemoglobin: 8.7 g/dL — ABNORMAL LOW (ref 13.0–17.0)
MCH: 30.6 pg (ref 26.0–34.0)
MCHC: 30.5 g/dL (ref 30.0–36.0)
MCV: 100.4 fL — ABNORMAL HIGH (ref 80.0–100.0)
Platelets: 145 10*3/uL — ABNORMAL LOW (ref 150–400)
RBC: 2.84 MIL/uL — ABNORMAL LOW (ref 4.22–5.81)
RDW: 19 % — ABNORMAL HIGH (ref 11.5–15.5)
WBC: 3.9 10*3/uL — ABNORMAL LOW (ref 4.0–10.5)
nRBC: 0 % (ref 0.0–0.2)

## 2020-11-15 NOTE — Discharge Summary (Signed)
Physician Discharge Summary  Kingsly Kloepfer ZOX:[id number] DOB: 1951/03/16 DOA: 11/07/2020  PCP: Galvin Proffer, MD  Admit date: 11/07/2020 Discharge date: 11/15/2020  Admitted From: SNF Disposition: SNF  Recommendations for Outpatient Follow-up:  1. Follow up with PCP in 1-2 weeks 2. Hold Plavix for 2 weeks per gastroenterology then can resume  Home Health: none Equipment/Devices: none  Discharge Condition: stable CODE STATUS: DNR Diet recommendation: regular  HPI: Per admitting MD, Austin Hartman is a 70 y.o. 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  The patient was discharged from hospital on 11/01/2020 on room air, was reportedly doing fairly well at the nursing facility until he was noted to have blood in his diaper this afternoon.  Patient does not have any complaints in the ED but his family notes that he often has difficulty verbalizing when he does have pain or discomfort.  Hospital Course / Discharge diagnoses: Principal Problem Bright red blood per rectum, acute blood loss anemia, hemorrhagic shock -Patient was admitted to the hospital with sudden onset of bright red blood per rectum, unclear whether had some associated pain due to being a poor historian.  Underwent a CT angiogram x2 due to ongoing bleeding without a cause.  GI consulted and patient underwent a flexible sigmoidoscopy on 1/18 which showed congested, eroded, plaque covered and ulcerated mucosa in the sigmoid colon with many punched out ulcers, unclear etiology whether this is atypical ischemia versus viral induced given recent Covid.  No lesions amenable to interventions.  He was also quite  hypotensive requiring peripheral vasopressors. Received a total of 5 units of packed red blood cells and platelets due to platelet dysfunction in the setting of recent Plavix.  Once Plavix is washed out of his system no longer had any bleeding, his stools are brown, last bloody bowel movement was 4 days ago and hemoglobin has remained stable  Active Problems Recent COVID-19 infection -21 days have passed, discontinue precautions. . TBI, seizure disorder, speech impairment-TBI at age 34, chronic right-sided weakness and speech difficulties. Continue home medications.  Hypokalemia  -Due to GI losses, potassium has now normalized A. Fib/flutter -Not anticoagulated due to history of SDH and now GI bleed. Continue amiodarone, digoxin Right leg pain, swelling -Lower extremity Doppler did not show any evidence of DVT thankfully.  Has not been complaining of pain for the past couple of days CAD -Hold Plavix in the setting of bleed. Per GI hold for 2 weeks and then resume Chronic diastolic CHF -Currently appears euvolemic Chronic kidney disease stage II -Baseline about 1.2, currently at baseline Hypernatremia -Mild, encourage p.o. intake  Sepsis ruled out   Discharge Instructions   Allergies as of 11/15/2020      Reactions   Acetaminophen Other (See Comments)   unknown   Azithromycin Other (See Comments)   unknown   Biaxin [clarithromycin] Other (See Comments)   unknown   Diltiazem Other (See Comments)   unknown   Other Other (See Comments)   Darvocet-N - unknown   Verapamil Other (See Comments)   unknown      Medication List    STOP taking these medications   aspirin EC 81 MG tablet   clopidogrel 75 MG tablet Commonly known as: PLAVIX  predniSONE 20 MG tablet Commonly known as: DELTASONE     TAKE these medications   acetaminophen 325 MG tablet Commonly known as: TYLENOL Take 650 mg by mouth every 6 (six) hours as needed for moderate pain.   amiodarone 200 MG  tablet Commonly known as: PACERONE Take 200 mg by mouth daily.   ammonium lactate 12 % cream Commonly known as: AMLACTIN Apply 1 g topically at bedtime. Both legs/feet   atorvastatin 20 MG tablet Commonly known as: LIPITOR Take 20 mg by mouth daily.   bethanechol 10 MG tablet Commonly known as: URECHOLINE Take 10 mg by mouth 3 (three) times daily.   Biofreeze 4 % Gel Generic drug: Menthol (Topical Analgesic) Apply 1 application topically 2 (two) times daily.   bisacodyl 10 MG suppository Commonly known as: DULCOLAX Place 10 mg rectally 2 (two) times daily as needed for moderate constipation.   cloBAZam 10 MG tablet Commonly known as: ONFI Take 40 mg by mouth daily.   Cranberry 450 MG Caps Take 450 mg by mouth daily.   digoxin 0.125 MG tablet Commonly known as: LANOXIN Take 125 mcg daily by mouth.   escitalopram 10 MG tablet Commonly known as: LEXAPRO Take 10 mg by mouth daily.   finasteride 5 MG tablet Commonly known as: PROSCAR Take 5 mg by mouth daily.   gabapentin 300 MG capsule Commonly known as: NEURONTIN Take 300 mg by mouth at bedtime.   guaifenesin 400 MG Tabs tablet Commonly known as: HUMIBID E Take 400 mg by mouth 2 (two) times daily.   Ipratropium-Albuterol 20-100 MCG/ACT Aers respimat Commonly known as: COMBIVENT Inhale 2 puffs into the lungs 2 (two) times daily. Take 2 times daily x 5 days then every 6 hours as needed. What changed:   when to take this  additional instructions   lamoTRIgine 200 MG tablet Commonly known as: LAMICTAL Take 400 mg by mouth 2 (two) times daily.   levETIRAcetam 1000 MG tablet Commonly known as: KEPPRA Take 1,000 mg by mouth 2 (two) times daily. Take along with 250 mg, total 1250 mg at 9 am.   levETIRAcetam 250 MG tablet Commonly known as: KEPPRA Take 250 mg by mouth daily. Takes along with 1000 mg , total 1250 mg. At 9 am.   levETIRAcetam 1000 MG tablet Commonly known as: KEPPRA Take 1,000 mg by mouth  at bedtime.   Levothyroxine Sodium 112 MCG Caps Take 112 mcg by mouth daily before breakfast.   linaclotide 290 MCG Caps capsule Commonly known as: LINZESS Take 290 mcg by mouth daily.   loratadine 10 MG tablet Commonly known as: CLARITIN Take 10 mg by mouth daily.   meloxicam 15 MG tablet Commonly known as: MOBIC Take 15 mg by mouth daily.   metoprolol tartrate 25 MG tablet Commonly known as: LOPRESSOR Take 12.5 mg by mouth 3 (three) times daily.   MILK OF MAGNESIA PO Take 30 mLs by mouth daily as needed (constipation).   montelukast 10 MG tablet Commonly known as: SINGULAIR Take 10 mg by mouth at bedtime.   MULTIVITAMIN PO Take 1 tablet by mouth daily.   pantoprazole 20 MG tablet Commonly known as: PROTONIX Take 20 mg by mouth daily.   polyethylene glycol 17 g packet Commonly known as: MIRALAX / GLYCOLAX Take 17 g by mouth daily.   sennosides-docusate sodium 8.6-50 MG tablet Commonly known as: SENOKOT-S Take 2 tablets by mouth 2 (two) times daily.   sodium fluoride 1.1 % Crea dental cream Commonly known as: PREVIDENT  5000 PLUS Place 1 application onto teeth 2 (two) times daily.   tamsulosin 0.4 MG Caps capsule Commonly known as: FLOMAX Take 0.8 mg by mouth daily after supper.   torsemide 20 MG tablet Commonly known as: Demadex Take 2 tablets (40 mg total) by mouth 2 (two) times daily.        Consultations:  GI   Procedures/Studies:  Flex sig  CT HEAD WO CONTRAST  Result Date: 11/11/2020 CLINICAL DATA:  Mental status change, unknown cause EXAM: CT HEAD WITHOUT CONTRAST TECHNIQUE: Contiguous axial images were obtained from the base of the skull through the vertex without intravenous contrast. COMPARISON:  10/25/2020. FINDINGS: Brain: No acute infarct or intracranial hemorrhage. No mass lesion. No midline shift, ventriculomegaly or extra-axial fluid collection. Left cerebral encephalomalacia with ex vacuo dilatation of the left lateral ventricle,  unchanged. Vascular: No hyperdense vessel or unexpected calcification. Skull: Chronic post traumatic/postsurgical sequela of the left calvarium. Sinuses/Orbits: Normal orbits. Right maxillary sinus mucous retention cyst. Other: None. IMPRESSION: No acute intracranial finding. Stable appearance of chronic findings. Electronically Signed   By: Stana Buntinghikanele  Emekauwa M.D.   On: 11/11/2020 15:53   CT Head Wo Contrast  Result Date: 10/25/2020 CLINICAL DATA:  COVID-19 positive 10/10/2020, traumatic brain injury, altered level of consciousness EXAM: CT HEAD WITHOUT CONTRAST TECHNIQUE: Contiguous axial images were obtained from the base of the skull through the vertex without intravenous contrast. COMPARISON:  None. FINDINGS: Brain: Diffuse encephalomalacia throughout the left cerebral hemisphere consistent with postsurgical and posttraumatic change. No signs of acute infarct or hemorrhage. Ex vacuo dilatation of the lateral ventricle. Midline structures are unremarkable. No acute extra-axial fluid collections. No mass effect. Vascular: No hyperdense vessel or unexpected calcification. Skull: Previous left frontotemporal craniectomy. No acute fractures. Sinuses/Orbits: Minimal polypoid mucosal thickening right maxillary sinus. Remaining sinuses are clear. Other: Reconstructed images demonstrate no additional findings. IMPRESSION: 1. Large left frontotemporal craniectomy, with underlying left cerebral hemispheric encephalomalacia. 2. No acute infarct or hemorrhage. Electronically Signed   By: Sharlet SalinaMichael  Brown M.D.   On: 10/25/2020 03:59   US RENAL  Result Date: 10/26/2020 CLINICAL DATA:  Acute renal failure. EXAM: RENAL / URINARY TRACT ULTRASOUND COMPLETE COMPARISON:  Abdominal ultrasound 04/09/2016 FINDINGS: Right Kidney: Renal measurements: 10.6 x 5.1 x 5.1 cm = volume: 143 mL. Diffuse cortical thinning with increased echogenicity. No mass or hydronephrosis visualized. Left Kidney: Renal measurements: 9.1 x 5.6 x 5.2 cm =  volume: 140 mL. Diffuse cortical thinning with increased echogenicity. No mass or hydronephrosis visualized. Bladder: Decompressed by Foley catheter. Other: None. IMPRESSION: Bilateral renal cortical thinning without hydronephrosis. Electronically Signed   By: Kennith CenterEric  Mansell M.D.   On: 10/26/2020 10:03   DG CHEST PORT 1 VIEW  Result Date: 10/26/2020 CLINICAL DATA:  Respiratory failure.  COVID. EXAM: PORTABLE CHEST 1 VIEW COMPARISON:  10/24/2020. FINDINGS: Patient is rotated to the right. Thoracic aorta again noted be tortuous. Stable cardiomegaly. Low lung volumes with persistent bilateral atelectasis. Atelectasis progressed in the right lung base. Persistent interstitial prominence left lung. Small right pleural effusion cannot be excluded. No pneumothorax. No acute bony abnormality identified. IMPRESSION: 1. Low lung volumes with persistent bilateral atelectasis. Atelectasis progressed in the right lung base. Persistent interstitial prominence left lung. Small right pleural effusion cannot be excluded. 2. Stable cardiomegaly. Electronically Signed   By: Maisie Fushomas  Register   On: 10/26/2020 05:34   DG Chest Port 1 View  Result Date: 10/24/2020 CLINICAL DATA:  70 year old male with positive COVID-19 and hypoxia. EXAM: PORTABLE CHEST 1  VIEW COMPARISON:  Chest radiograph dated 03/30/2016. FINDINGS: Diffuse interstitial coarsening. Bilateral mid to lower lung field streaky densities, likely atelectasis. Developing infiltrate is not excluded. No lobar consolidation, pleural effusion or pneumothorax. The right costophrenic angle is obscured due to superimposed support device. Stable cardiomegaly. Hiatal hernia. No acute osseous pathology. IMPRESSION: Bilateral mid to lower lung field streaky densities, likely atelectasis. Developing infiltrate is not excluded. Electronically Signed   By: Elgie Collard M.D.   On: 10/24/2020 17:59   DG Abd 2 Views  Result Date: 10/24/2020 CLINICAL DATA:  COVID positive with low  oxygen saturation and abdominal distension. EXAM: ABDOMEN - 2 VIEW COMPARISON:  None. FINDINGS: The bowel gas pattern is normal. A large amount of stool is seen within the distal sigmoid colon. There is no evidence of free air. No radio-opaque calculi or other significant radiographic abnormality is seen. IMPRESSION: 1. No evidence of bowel obstruction. Electronically Signed   By: Aram Candela M.D.   On: 10/24/2020 21:42   VAS [place] LOWER EXTREMITY VENOUS (DVT)  Result Date: 11/09/2020  Lower Venous DVT Study Indications: Swelling.  Risk Factors: COVID 19 positive. Limitations: Body habitus, poor ultrasound/tissue interface and patient positioning, patient movement. Comparison Study: No prior studies. Performing Technologist: Chanda Busing RVT  Examination Guidelines: A complete evaluation includes B-mode imaging, spectral Doppler, color Doppler, and power Doppler as needed of all accessible portions of each vessel. Bilateral testing is considered an integral part of a complete examination. Limited examinations for reoccurring indications may be performed as noted. The reflux portion of the exam is performed with the patient in reverse Trendelenburg.  +---------+---------------+---------+-----------+----------+--------------+ RIGHT    CompressibilityPhasicitySpontaneityPropertiesThrombus Aging +---------+---------------+---------+-----------+----------+--------------+ CFV      Full           Yes      Yes                                 +---------+---------------+---------+-----------+----------+--------------+ SFJ      Full                                                        +---------+---------------+---------+-----------+----------+--------------+ FV Prox  Full                                                        +---------+---------------+---------+-----------+----------+--------------+ FV Mid   Full                                                         +---------+---------------+---------+-----------+----------+--------------+ FV DistalFull                                                        +---------+---------------+---------+-----------+----------+--------------+ PFV      Full                                                        +---------+---------------+---------+-----------+----------+--------------+  POP      Full           Yes      Yes                                 +---------+---------------+---------+-----------+----------+--------------+ PTV      Full                                                        +---------+---------------+---------+-----------+----------+--------------+ PERO     Full                                                        +---------+---------------+---------+-----------+----------+--------------+   +---------+---------------+---------+-----------+----------+-------------------+ LEFT     CompressibilityPhasicitySpontaneityPropertiesThrombus Aging      +---------+---------------+---------+-----------+----------+-------------------+ CFV      Full           Yes      Yes                                      +---------+---------------+---------+-----------+----------+-------------------+ SFJ      Full                                                             +---------+---------------+---------+-----------+----------+-------------------+ FV Prox  Full                                                             +---------+---------------+---------+-----------+----------+-------------------+ FV Mid   Full                                                             +---------+---------------+---------+-----------+----------+-------------------+ FV Distal               Yes      Yes                                      +---------+---------------+---------+-----------+----------+-------------------+ PFV      Full                                                              +---------+---------------+---------+-----------+----------+-------------------+ POP      Full  Yes      Yes                                      +---------+---------------+---------+-----------+----------+-------------------+ PTV      Full                                                             +---------+---------------+---------+-----------+----------+-------------------+ PERO                                                  Not well visualized +---------+---------------+---------+-----------+----------+-------------------+     Summary: RIGHT: - There is no evidence of deep vein thrombosis in the lower extremity. However, portions of this examination were limited- see technologist comments above.  - No cystic structure found in the popliteal fossa.  LEFT: - There is no evidence of deep vein thrombosis in the lower extremity. However, portions of this examination were limited- see technologist comments above.  - No cystic structure found in the popliteal fossa.  *See table(s) above for measurements and observations. Electronically signed by Sherald Hess MD on 11/09/2020 at 6:05:46 PM.    Final    VAS [place] LOWER EXTREMITY VENOUS (DVT)  Result Date: 10/25/2020  Lower Venous DVT Study Other Indications: Covid, D-Dimer. Limitations: Body habitus and Pt unresponsive to instructions. Comparison Study: No previous exam Performing Technologist: Clint Guy RVT  Examination Guidelines: A complete evaluation includes B-mode imaging, spectral Doppler, color Doppler, and power Doppler as needed of all accessible portions of each vessel. Bilateral testing is considered an integral part of a complete examination. Limited examinations for reoccurring indications may be performed as noted. The reflux portion of the exam is performed with the patient in reverse Trendelenburg.  +---------+---------------+---------+-----------+----------+--------------+ RIGHT     CompressibilityPhasicitySpontaneityPropertiesThrombus Aging +---------+---------------+---------+-----------+----------+--------------+ CFV      Full           Yes      Yes                                 +---------+---------------+---------+-----------+----------+--------------+ SFJ      Full                                                        +---------+---------------+---------+-----------+----------+--------------+ FV Prox  Full                                                        +---------+---------------+---------+-----------+----------+--------------+ FV Mid   Full                                                        +---------+---------------+---------+-----------+----------+--------------+  FV DistalFull                                                        +---------+---------------+---------+-----------+----------+--------------+ PFV      Full                                                        +---------+---------------+---------+-----------+----------+--------------+ POP      Full           Yes      Yes                                 +---------+---------------+---------+-----------+----------+--------------+ PTV      Full                                                        +---------+---------------+---------+-----------+----------+--------------+ PERO     Full                                                        +---------+---------------+---------+-----------+----------+--------------+   +---------+---------------+---------+-----------+----------+-------------------+ LEFT     CompressibilityPhasicitySpontaneityPropertiesThrombus Aging      +---------+---------------+---------+-----------+----------+-------------------+ CFV      Full           Yes      Yes                                      +---------+---------------+---------+-----------+----------+-------------------+ SFJ      Full                                                              +---------+---------------+---------+-----------+----------+-------------------+ FV Prox  Full                                                             +---------+---------------+---------+-----------+----------+-------------------+ FV Mid   Full                                                             +---------+---------------+---------+-----------+----------+-------------------+ FV DistalFull                                                             +---------+---------------+---------+-----------+----------+-------------------+  PFV      Full                                                             +---------+---------------+---------+-----------+----------+-------------------+ POP                                                   Not well visualized +---------+---------------+---------+-----------+----------+-------------------+ PTV      Full                                                             +---------+---------------+---------+-----------+----------+-------------------+ PERO     Full                                                             +---------+---------------+---------+-----------+----------+-------------------+   Left Technical Findings: Not visualized segments include Popliteal vein.   Summary: RIGHT: - There is no evidence of deep vein thrombosis in the lower extremity.  - No cystic structure found in the popliteal fossa.  LEFT: - There is no evidence of deep vein thrombosis in the lower extremity. However, portions of this examination were limited- see technologist comments above.  - No cystic structure found in the popliteal fossa.  *See table(s) above for measurements and observations. Electronically signed by Gretta Began MD on 10/25/2020 at 2:02:01 PM.    Final    [place] EKG SITE RITE  Result Date: 10/24/2020 If Site Rite image not attached, placement could not be confirmed due to  current cardiac rhythm.  CT Angio Abd/Pel W and/or Wo Contrast  Result Date: 11/07/2020 CLINICAL DATA:  Bloody stools, COVID-19 positivity EXAM: CTA ABDOMEN AND PELVIS WITHOUT AND WITH CONTRAST TECHNIQUE: Multidetector CT imaging of the abdomen and pelvis was performed using the standard protocol during bolus administration of intravenous contrast. Multiplanar reconstructed images and MIPs were obtained and reviewed to evaluate the vascular anatomy. CONTRAST:  OMNIPAQUE IOHEXOL 350 MG/ML SOLN COMPARISON:  03/30/2016 FINDINGS: VASCULAR Aorta: Abdominal aorta demonstrates atherosclerotic calcifications without aneurysmal dilatation. No dissection is seen. Celiac: Patent without evidence of aneurysm, dissection, vasculitis or significant stenosis. SMA: Patent without evidence of aneurysm, dissection, vasculitis or significant stenosis. 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 Bilateral atherosclerotic calcifications are seen with mild stenoses in the origins of the main renal arteries. IMA: Patent Iliacs: Atherosclerotic calcifications are noted without aneurysmal dilatation or dissection. Outflow in the upper thighs is within normal limits. Veins: No specific venous abnormality is noted. Review of the MIP images confirms the above findings. NON-VASCULAR Lower chest: No acute abnormality. Hepatobiliary: No focal liver abnormality is seen. Status post cholecystectomy. No biliary dilatation. Pancreas: Unremarkable. No pancreatic ductal dilatation or surrounding inflammatory changes. Spleen: Normal in size without focal abnormality. Adrenals/Urinary Tract: Adrenal glands are within normal limits. Kidneys show  no renal calculi or obstructive changes. Normal enhancement pattern is noted bilaterally. Bladder is well distended. Stomach/Bowel: The appendix is not well visualized although no inflammatory changes to suggest appendicitis are seen. No obstructive or inflammatory changes of the colon are noted.  The sigmoid is significantly redundant. No findings of acute GI hemorrhage are seen. Small bowel and stomach are within normal limits. Lymphatic: No significant vascular findings are present. No enlarged abdominal or pelvic lymph nodes. Reproductive: Prostate is unremarkable. Other: No abdominal wall hernia or abnormality. No abdominopelvic ascites. Musculoskeletal: Degenerative changes of lumbar spine are noted. Stable sclerotic focus is noted in the L5 vertebral body on the right. IMPRESSION: VASCULAR No abdominal aortic aneurysm or dissection is noted. No findings to suggest active arterial hemorrhage are noted within the GI tract. Scattered atherosclerotic disease as described. NON-VASCULAR No acute abnormality in the abdomen and pelvis. Electronically Signed   By: Alcide Clever M.D.   On: 11/07/2020 19:44      Subjective: - no chest pain, shortness of breath, no abdominal pain, nausea or vomiting.   Discharge Exam: BP (!) 103/55 (BP Location: Right Arm)   Pulse 96   Temp 98.2 F (36.8 C) (Oral)   Resp 20   Ht 6\' 4"  (1.93 m)   Wt (!) 139.5 kg   SpO2 94%   BMI 37.44 kg/m   General: Pt is alert, awake, not in acute distress Cardiovascular: RRR, S1/S2 +, no rubs, no gallops Respiratory: CTA bilaterally, no wheezing, no rhonchi Abdominal: Soft, NT, ND, bowel sounds +   The results of significant diagnostics from this hospitalization (including imaging, microbiology, ancillary and laboratory) are listed below for reference.     Microbiology: Recent Results (from the past 240 hour(s))  MRSA PCR Screening     Status: Abnormal   Collection Time: 11/09/20 11:18 PM   Specimen: Nasal Mucosa; Nasopharyngeal  Result Value Ref Range Status   MRSA by PCR POSITIVE (A) NEGATIVE Final    Comment:        The GeneXpert MRSA Assay (FDA approved for NASAL specimens only), is one component of a comprehensive MRSA colonization surveillance program. It is not intended to diagnose MRSA infection  nor to guide or monitor treatment for MRSA infections. RESULT CALLED TO, READ BACK BY AND VERIFIED WITH: WALTON C. 01.20.22 @ 0121 BY MECIAL J. Performed at [facility], [address], [place], [place] [address]      Labs: Basic Metabolic Panel: Recent Labs  Lab 11/10/20 [phone] 11/11/20 0635 11/12/20 0207 11/13/20 0248 11/14/20 0457  NA 149* 145 146* 144 146*  K 3.2* 3.6 3.9 4.0 4.7  CL 114* 112* 111 111 113*  CO2 31 25 26 23 24   GLUCOSE 95 106* 93 94 83  BUN 16 17 20 19 17   CREATININE 1.18 1.14 1.23 1.25* 1.10  CALCIUM 7.9* 8.0* 8.3* 8.6* 8.3*   Liver Function Tests: Recent Labs  Lab 11/09/20 0614 11/10/20 0839  AST 19 29  ALT 13 16  ALKPHOS 58 57  BILITOT 1.7* 1.3*  PROT 5.4* 5.5*  ALBUMIN 3.0* 2.7*   CBC: Recent Labs  Lab 11/12/20 1642 11/13/20 0718 11/13/20 1612 11/14/20 0457 11/15/20 0923  WBC 3.8* 3.9* 4.0 4.2 3.9*  HGB 8.0* 8.3* 8.6* 8.7* 8.7*  HCT 26.1* 27.1* 29.0* 27.8* 28.5*  MCV 98.1 98.5 102.1* 96.2 100.4*  PLT 147* 115* 145* 151 145*   CBG: No results for input(s): GLUCAP in the last 168 hours. Hgb A1c No results  for input(s): HGBA1C in the last 72 hours. Lipid Profile No results for input(s): CHOL, HDL, LDLCALC, TRIG, CHOLHDL, LDLDIRECT in the last 72 hours. Thyroid function studies No results for input(s): TSH, T4TOTAL, T3FREE, THYROIDAB in the last 72 hours.  Invalid input(s): FREET3 Urinalysis    Component Value Date/Time   COLORURINE AMBER (A) 10/26/2020 0046   APPEARANCEUR HAZY (A) 10/26/2020 0046   LABSPEC 1.019 10/26/2020 0046   PHURINE 5.0 10/26/2020 0046   GLUCOSEU NEGATIVE 10/26/2020 0046   HGBUR SMALL (A) 10/26/2020 0046   BILIRUBINUR NEGATIVE 10/26/2020 0046   KETONESUR NEGATIVE 10/26/2020 0046   PROTEINUR 30 (A) 10/26/2020 0046   NITRITE NEGATIVE 10/26/2020 0046   LEUKOCYTESUR NEGATIVE 10/26/2020 0046    FURTHER DISCHARGE INSTRUCTIONS:   Get Medicines reviewed and adjusted: Please take all  your medications with you for your next visit with your Primary MD   Laboratory/radiological data: Please request your Primary MD to go over all hospital tests and procedure/radiological results at the follow up, please ask your Primary MD to get all Hospital records sent to his/her office.   In some cases, they will be blood work, cultures and biopsy results pending at the time of your discharge. Please request that your primary care M.D. goes through all the records of your hospital data and follows up on these results.   Also Note the following: If you experience worsening of your admission symptoms, develop shortness of breath, life threatening emergency, suicidal or homicidal thoughts you must seek medical attention immediately by calling 911 or calling your MD immediately  if symptoms less severe.   You must read complete instructions/literature along with all the possible adverse reactions/side effects for all the Medicines you take and that have been prescribed to you. Take any new Medicines after you have completely understood and accpet all the possible adverse reactions/side effects.    Do not drive when taking Pain medications or sleeping medications (Benzodaizepines)   Do not take more than prescribed Pain, Sleep and Anxiety Medications. It is not advisable to combine anxiety,sleep and pain medications without talking with your primary care practitioner   Special Instructions: If you have smoked or chewed Tobacco  in the last 2 yrs please stop smoking, stop any regular Alcohol  and or any Recreational drug use.   Wear Seat belts while driving.   Please note: You were cared for by a hospitalist during your hospital stay. Once you are discharged, your primary care physician will handle any further medical issues. Please note that NO REFILLS for any discharge medications will be authorized once you are discharged, as it is imperative that you return to your primary care physician (or  establish a relationship with a primary care physician if you do not have one) for your post hospital discharge needs so that they can reassess your need for medications and monitor your lab values.  Time coordinating discharge: 35 minutes  SIGNED:  Pamella Pert, MD, PhD 11/15/2020, 9:41 AM

## 2020-11-15 NOTE — Progress Notes (Signed)
Called report to [facility] and she is familiar with the pt.

## 2020-11-15 NOTE — TOC Progression Note (Addendum)
Transition of Care [facility]) - Progression Note    Patient Details  Name: Austin Hartman MRN: [id number] Date of Birth: 12-Jun-1951  Transition of Care [facility]) CM/SW Contact  Geni Bers, RN Phone Number: 11/15/2020, 10:44 AM  Clinical Narrative:    Spoke with [facility] on [place], [place].  Admission Coordinator in AM concerning pt's return today. Pt may return. PT[facility] was called after speaking with pt's RN. Pt's sister [name], was called and aware that pt will return to [facility] today. [name] states, "Thank you all for everything that you are doing."   Expected Discharge Plan: Skilled Nursing Facility Barriers to Discharge: Continued Medical Work up  Expected Discharge Plan and Services Expected Discharge Plan: Skilled Nursing Facility   Discharge Planning Services: CM Consult   Living arrangements for the past 2 months: Skilled Nursing Facility Expected Discharge Date: 11/15/20                                     Social Determinants of Health (SDOH) Interventions    Readmission Risk Interventions No flowsheet data found.

## 2020-11-15 NOTE — Plan of Care (Signed)
  Problem: Education: Goal: Ability to identify signs and symptoms of gastrointestinal bleeding will improve Outcome: Adequate for Discharge   Problem: Bowel/Gastric: Goal: Will show no signs and symptoms of gastrointestinal bleeding Outcome: Adequate for Discharge

## 2020-12-05 ENCOUNTER — Telehealth: Payer: Self-pay | Admitting: Neurology

## 2020-12-05 NOTE — Telephone Encounter (Signed)
Patient's sister called in and wanted Dr. Karel Jarvis to be aware that the patient passed away on Dec 20, 2020. She wanted Dr. Karel Jarvis to let Dr. Yisroel Ramming know as well. She said Dr. Yisroel Ramming has 2 patients with the same name and date of birth, so the patient is the tall one.

## 2021-04-17 ENCOUNTER — Ambulatory Visit: Payer: Medicare Other | Admitting: Neurology

## 2021-05-15 IMAGING — CT CT CTA ABD/PEL W/CM AND/OR W/O CM
3 of 12 series · 13 of 46 positions shown, 18 images · IV contrast (omnipaque)
Comparison: 03/30/2016

CLINICAL DATA: Bloody stools, [id number] positivity

EXAM:
CTA ABDOMEN AND PELVIS WITHOUT AND WITH CONTRAST
TECHNIQUE: Multidetector CT imaging of the abdomen and pelvis was performed
using the standard protocol during bolus administration of
intravenous contrast. Multiplanar reconstructed images and MIPs were
obtained and reviewed to evaluate the vascular anatomy.
CONTRAST:  100mL OMNIPAQUE IOHEXOL 350 MG/ML SOLN

[Series 6: axial arterial · axial · arterial · 0.98mm/px · z∈[+908,+1340]mm · 8 of 264 slices shown]
[im 24/264  soft-tissue]
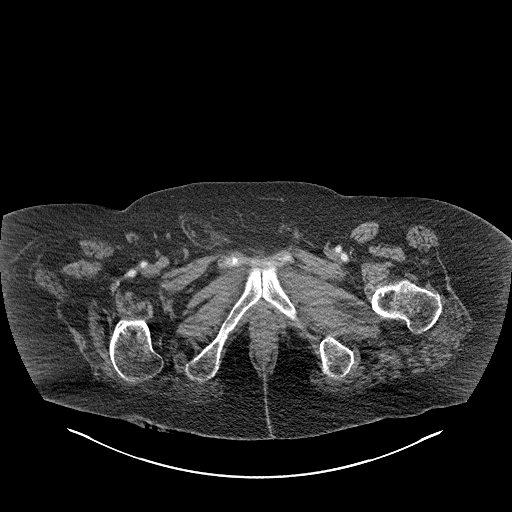
[im 48/264  soft-tissue]
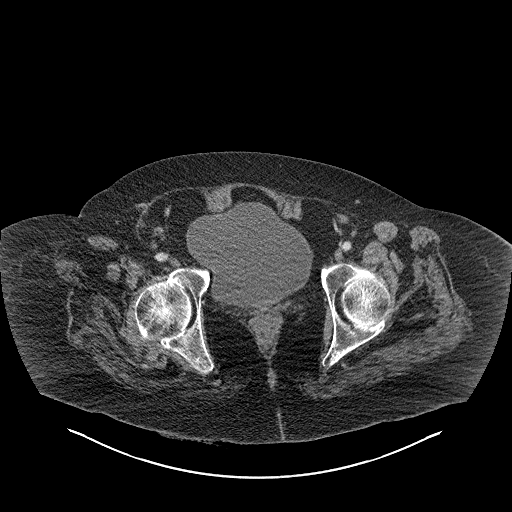
[im 96/264  soft-tissue]
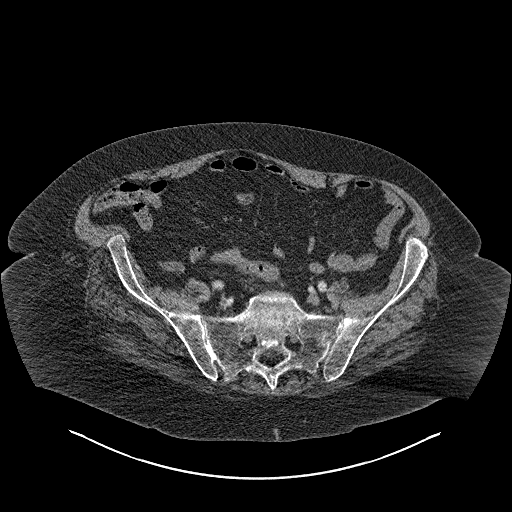
[im 120/264  soft-tissue]
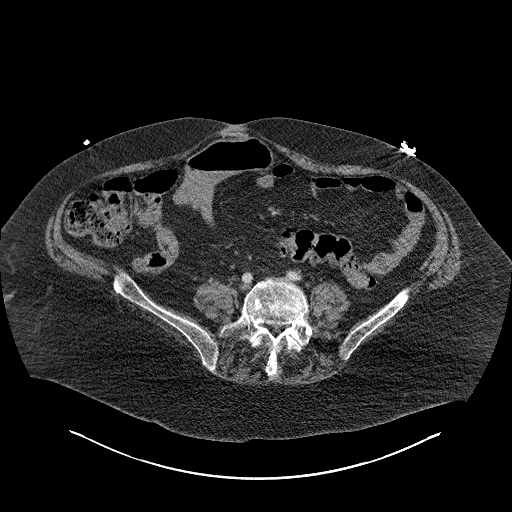
[im 144/264  soft-tissue]
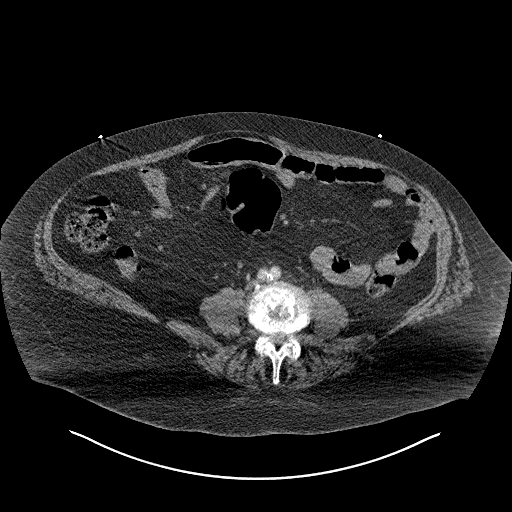
[im 168/264  soft-tissue]
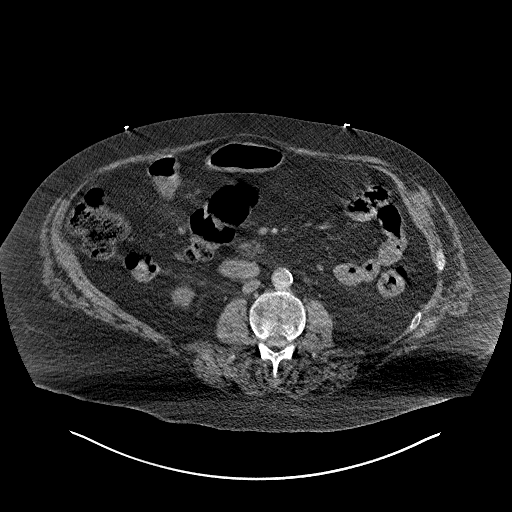
[im 216/264  soft-tissue]
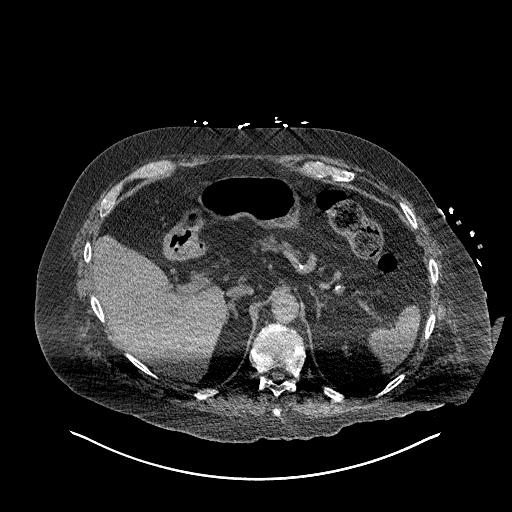
[im 240/264  soft-tissue]
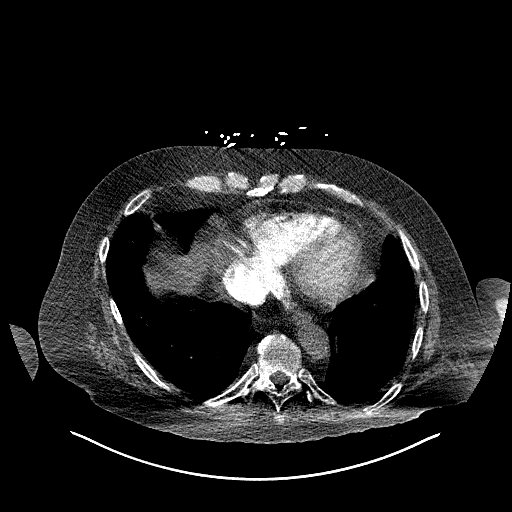

[Series 13: axial portal venous · axial · portal-venous · 0.98mm/px · z∈[+988,+1253]mm · 3 of 107 slices shown, 7 images]
[im 27/107  soft-tissue]
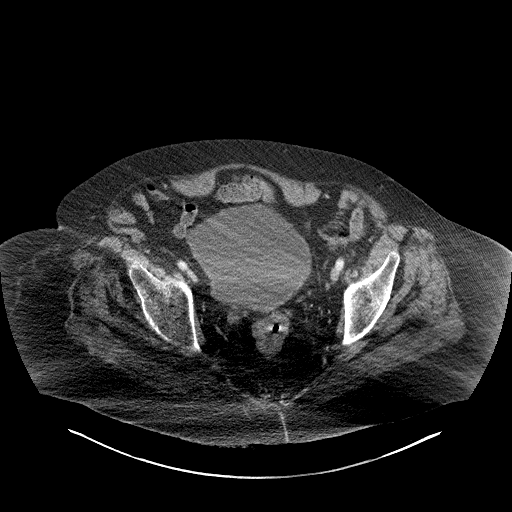
[im 27/107  lung]
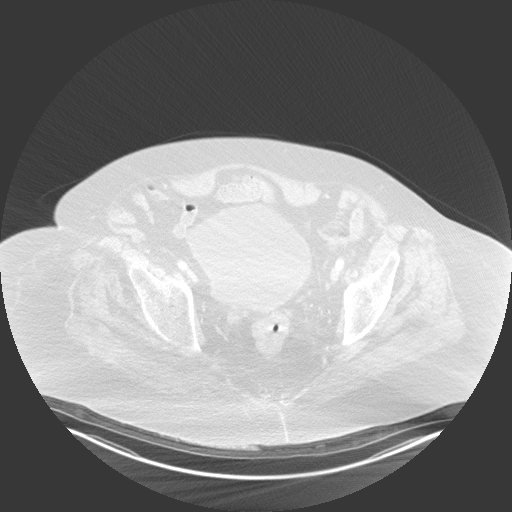
[im 27/107  bone]
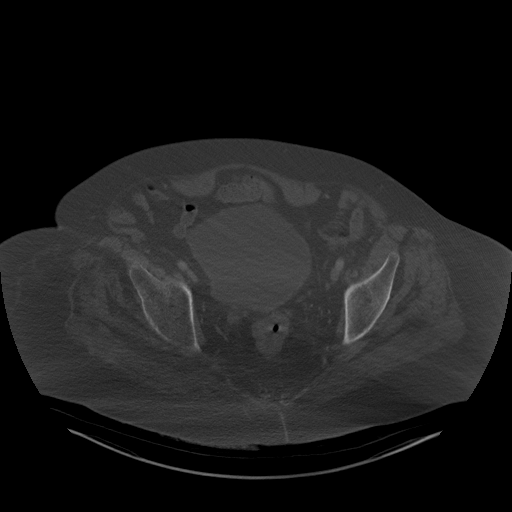
[im 54/107  soft-tissue]
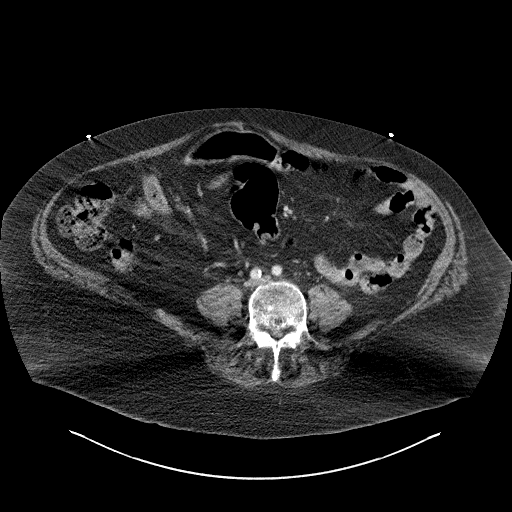
[im 54/107  lung]
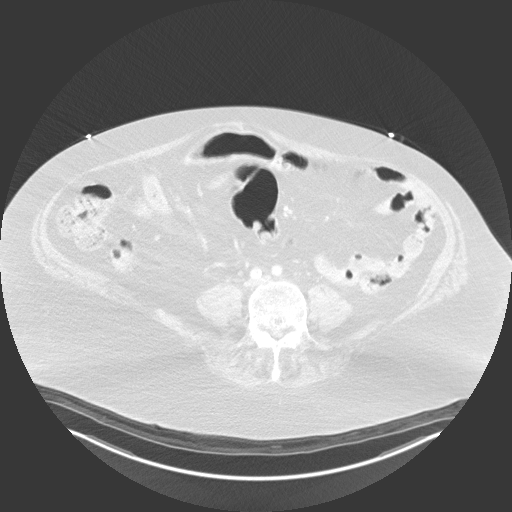
[im 80/107  soft-tissue]
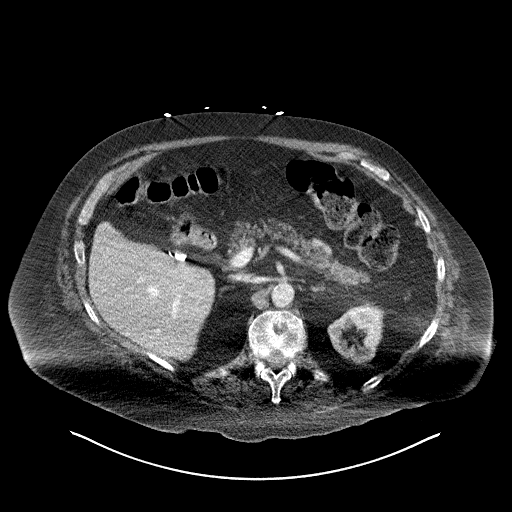
[im 80/107  lung]
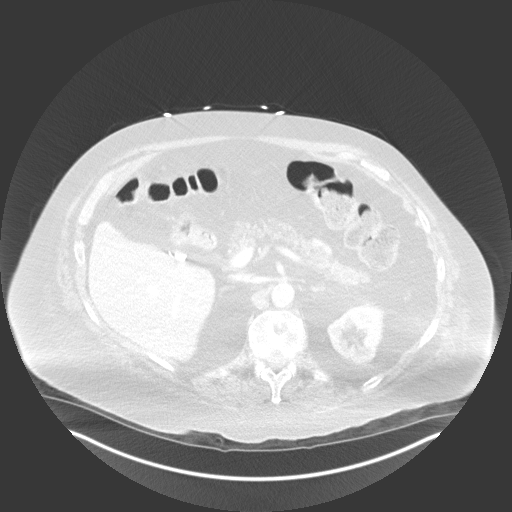

[Series 15: coronal venous · coronal · portal-venous · 1.08mm/px · 2 of 220 slices shown, 3 images]
[im 74/220  soft-tissue]
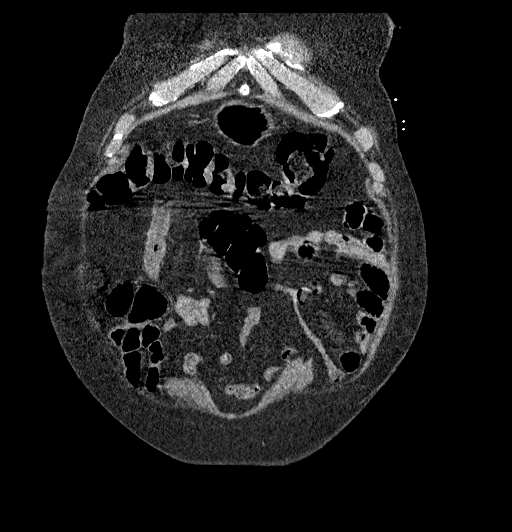
[im 74/220  bone]
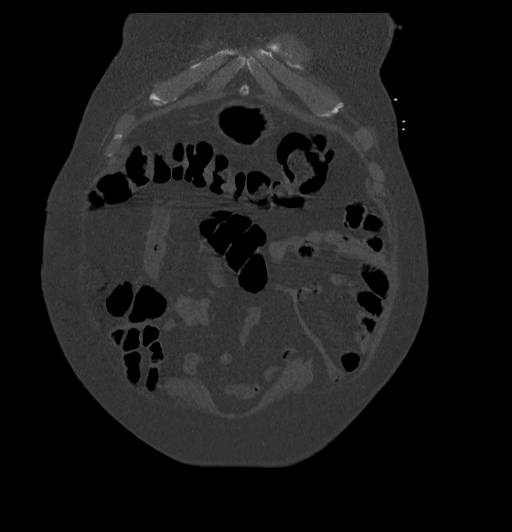
[im 147/220  soft-tissue]
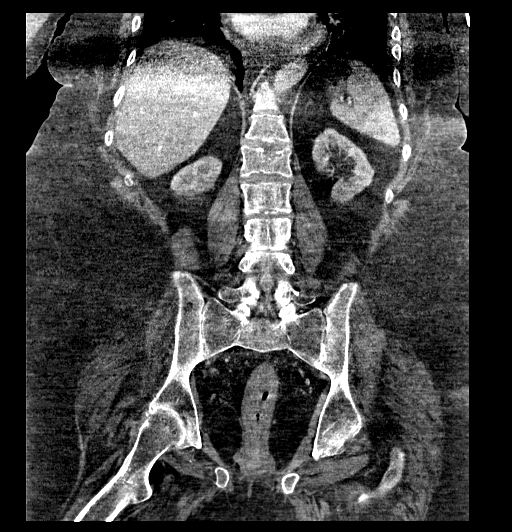

[13 of 46 positions shown; findings below may reference images not displayed]

FINDINGS: VASCULAR

Aorta: Abdominal aorta demonstrates atherosclerotic calcifications
without aneurysmal dilatation. No dissection is seen.

Celiac: Patent without evidence of aneurysm, dissection, vasculitis
or significant stenosis.

SMA: Patent without evidence of aneurysm, dissection, vasculitis or
significant stenosis.

Renals: Dual renal arteries are noted bilaterally. Bilateral
atherosclerotic calcifications are seen with mild stenoses in the
origins of the main renal arteries.

IMA: Patent

Iliacs: Atherosclerotic calcifications are noted without aneurysmal
dilatation or dissection. Outflow in the upper thighs is within
normal limits.

Veins: No specific venous abnormality is noted.

Review of the MIP images confirms the above findings.

NON-VASCULAR

Lower chest: No acute abnormality.

Hepatobiliary: No focal liver abnormality is seen. Status post
cholecystectomy. No biliary dilatation.

Pancreas: Unremarkable. No pancreatic ductal dilatation or
surrounding inflammatory changes.

Spleen: Normal in size without focal abnormality.

Adrenals/Urinary Tract: Adrenal glands are within normal limits.
Kidneys show no renal calculi or obstructive changes. Normal
enhancement pattern is noted bilaterally. Bladder is well distended.

Stomach/Bowel: The appendix is not well visualized although no
inflammatory changes to suggest appendicitis are seen. No
obstructive or inflammatory changes of the colon are noted. The
sigmoid is significantly redundant. No findings of acute GI
hemorrhage are seen. Small bowel and stomach are within normal
limits.

Lymphatic: No significant vascular findings are present. No enlarged
abdominal or pelvic lymph nodes.

Reproductive: Prostate is unremarkable.

Other: No abdominal wall hernia or abnormality. No abdominopelvic
ascites.

Musculoskeletal: Degenerative changes of lumbar spine are noted.
Stable sclerotic focus is noted in the L5 vertebral body on the
right.
IMPRESSION: VASCULAR

No abdominal aortic aneurysm or dissection is noted.

No findings to suggest active arterial hemorrhage are noted within
the GI tract.

Scattered atherosclerotic disease as described.

NON-VASCULAR

No acute abnormality in the abdomen and pelvis.
